# Patient Record
Sex: Female | Born: 1960 | Race: White | Hispanic: No | Marital: Single | Smoking: Never smoker
Health system: Southern US, Community
[De-identification: ages and names within clinical notes are randomized; demographics above are authoritative.]

## PROBLEM LIST (undated history)

## (undated) DIAGNOSIS — F319 Bipolar disorder, unspecified: Secondary | ICD-10-CM

## (undated) DIAGNOSIS — C029 Malignant neoplasm of tongue, unspecified: Secondary | ICD-10-CM

## (undated) DIAGNOSIS — K219 Gastro-esophageal reflux disease without esophagitis: Secondary | ICD-10-CM

## (undated) DIAGNOSIS — Z9889 Other specified postprocedural states: Secondary | ICD-10-CM

## (undated) DIAGNOSIS — C539 Malignant neoplasm of cervix uteri, unspecified: Secondary | ICD-10-CM

## (undated) DIAGNOSIS — E039 Hypothyroidism, unspecified: Secondary | ICD-10-CM

## (undated) DIAGNOSIS — I10 Essential (primary) hypertension: Secondary | ICD-10-CM

## (undated) HISTORY — PX: TONGUE SURGERY: SHX810

## (undated) HISTORY — PX: VAGINAL HYSTERECTOMY: SUR661

## (undated) HISTORY — PX: OTHER SURGICAL HISTORY: SHX169

## (undated) HISTORY — PX: PARTIAL GLOSSECTOMY: SHX2173

---

## 1998-05-16 ENCOUNTER — Other Ambulatory Visit: Admission: RE | Admit: 1998-05-16 | Discharge: 1998-05-16 | Payer: Self-pay | Admitting: Gynecology

## 1998-05-30 ENCOUNTER — Encounter: Payer: Self-pay | Admitting: Family Medicine

## 1998-05-30 ENCOUNTER — Ambulatory Visit (HOSPITAL_COMMUNITY): Admission: RE | Admit: 1998-05-30 | Discharge: 1998-05-30 | Payer: Self-pay | Admitting: Family Medicine

## 2005-12-03 ENCOUNTER — Emergency Department (HOSPITAL_COMMUNITY): Admission: EM | Admit: 2005-12-03 | Discharge: 2005-12-03 | Payer: Self-pay | Admitting: Emergency Medicine

## 2005-12-08 ENCOUNTER — Emergency Department (HOSPITAL_COMMUNITY): Admission: EM | Admit: 2005-12-08 | Discharge: 2005-12-08 | Payer: Self-pay | Admitting: Emergency Medicine

## 2006-07-26 ENCOUNTER — Emergency Department (HOSPITAL_COMMUNITY): Admission: EM | Admit: 2006-07-26 | Discharge: 2006-07-26 | Payer: Self-pay | Admitting: Emergency Medicine

## 2007-01-27 ENCOUNTER — Emergency Department (HOSPITAL_COMMUNITY): Admission: EM | Admit: 2007-01-27 | Discharge: 2007-01-27 | Payer: Self-pay | Admitting: Emergency Medicine

## 2007-08-07 ENCOUNTER — Emergency Department (HOSPITAL_COMMUNITY): Admission: EM | Admit: 2007-08-07 | Discharge: 2007-08-07 | Payer: Self-pay | Admitting: Emergency Medicine

## 2008-05-18 ENCOUNTER — Emergency Department (HOSPITAL_COMMUNITY): Admission: EM | Admit: 2008-05-18 | Discharge: 2008-05-18 | Payer: Self-pay | Admitting: Emergency Medicine

## 2008-06-08 ENCOUNTER — Emergency Department (HOSPITAL_COMMUNITY): Admission: EM | Admit: 2008-06-08 | Discharge: 2008-06-08 | Payer: Self-pay | Admitting: Emergency Medicine

## 2008-09-20 ENCOUNTER — Ambulatory Visit (HOSPITAL_COMMUNITY): Admission: RE | Admit: 2008-09-20 | Discharge: 2008-09-20 | Payer: Self-pay | Admitting: Internal Medicine

## 2009-03-30 ENCOUNTER — Ambulatory Visit (HOSPITAL_COMMUNITY): Admission: RE | Admit: 2009-03-30 | Discharge: 2009-03-30 | Payer: Self-pay | Admitting: Internal Medicine

## 2009-06-21 ENCOUNTER — Emergency Department (HOSPITAL_COMMUNITY): Admission: EM | Admit: 2009-06-21 | Discharge: 2009-06-21 | Payer: Self-pay | Admitting: Emergency Medicine

## 2010-04-06 ENCOUNTER — Ambulatory Visit (HOSPITAL_COMMUNITY): Admission: RE | Admit: 2010-04-06 | Discharge: 2010-04-06 | Payer: Self-pay | Admitting: Internal Medicine

## 2010-09-04 LAB — POCT I-STAT, CHEM 8
BUN: 10 mg/dL (ref 6–23)
Creatinine, Ser: 0.6 mg/dL (ref 0.4–1.2)
Glucose, Bld: 99 mg/dL (ref 70–99)
Hemoglobin: 14.3 g/dL (ref 12.0–15.0)
Sodium: 138 mEq/L (ref 135–145)
TCO2: 23 mmol/L (ref 0–100)

## 2010-12-25 ENCOUNTER — Telehealth: Payer: Self-pay | Admitting: Cardiology

## 2010-12-25 NOTE — Telephone Encounter (Signed)
CON/high blood pressure/self referral/ P# [phone], pt does not have regular doctor, no records available  Was going to schedule with either Dr. Elease Hashimoto or Dr. [place], however pt states was a former pt of Dr. 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 Leonard Schwartz, pt would like to have nurse call her back to see if there could be any exception for her being a new patient, she states she was seen by him in the 80's

## 2010-12-25 NOTE — Telephone Encounter (Signed)
Advised you were not taking new patients.  Her blood pressure has been running 170/116 for 2 weeks, increased stress. Did advise to go to urgent care, but wanted to wait for your answer.

## 2010-12-26 NOTE — Telephone Encounter (Signed)
Advised patient, states she will probably go to urgent care and then find PCP

## 2010-12-26 NOTE — Telephone Encounter (Signed)
She should go to Urgent Care then get established with a primary care.  I do not have any openings to see her.

## 2011-02-12 LAB — DIFFERENTIAL
Basophils Relative: 0
Eosinophils Absolute: 0.1
Eosinophils Relative: 1
Lymphs Abs: 1.9
Monocytes Relative: 5

## 2011-02-12 LAB — CBC
HCT: 42.9
MCHC: 35.3
MCV: 92.7
Platelets: 219
RBC: 4.62
WBC: 8.8

## 2011-02-12 LAB — I-STAT 8, (EC8 V) (CONVERTED LAB)
BUN: 8
Bicarbonate: 25.4 — ABNORMAL HIGH
Chloride: 107
pCO2, Ven: 43.3 — ABNORMAL LOW
pH, Ven: 7.376 — ABNORMAL HIGH

## 2011-02-12 LAB — POCT I-STAT CREATININE
Creatinine, Ser: 0.8
Operator id: 288831

## 2011-03-19 ENCOUNTER — Emergency Department (HOSPITAL_COMMUNITY)
Admission: EM | Admit: 2011-03-19 | Discharge: 2011-03-19 | Disposition: A | Discharge status: Home / Self Care | Payer: Medicare Other | Attending: Emergency Medicine | Admitting: Emergency Medicine

## 2011-03-19 ENCOUNTER — Emergency Department (HOSPITAL_COMMUNITY): Payer: Medicare Other

## 2011-03-19 DIAGNOSIS — IMO0001 Reserved for inherently not codable concepts without codable children: Secondary | ICD-10-CM | POA: Insufficient documentation

## 2011-03-19 DIAGNOSIS — IMO0002 Reserved for concepts with insufficient information to code with codable children: Secondary | ICD-10-CM | POA: Insufficient documentation

## 2011-03-19 DIAGNOSIS — R209 Unspecified disturbances of skin sensation: Secondary | ICD-10-CM | POA: Insufficient documentation

## 2011-03-19 DIAGNOSIS — Z79899 Other long term (current) drug therapy: Secondary | ICD-10-CM | POA: Insufficient documentation

## 2011-03-19 DIAGNOSIS — M545 Low back pain, unspecified: Secondary | ICD-10-CM | POA: Insufficient documentation

## 2011-03-19 DIAGNOSIS — Z85819 Personal history of malignant neoplasm of unspecified site of lip, oral cavity, and pharynx: Secondary | ICD-10-CM | POA: Insufficient documentation

## 2011-03-19 DIAGNOSIS — Z8541 Personal history of malignant neoplasm of cervix uteri: Secondary | ICD-10-CM | POA: Insufficient documentation

## 2014-03-31 ENCOUNTER — Encounter (HOSPITAL_COMMUNITY): Payer: Self-pay | Admitting: Emergency Medicine

## 2014-03-31 ENCOUNTER — Emergency Department (HOSPITAL_COMMUNITY)
Admission: EM | Admit: 2014-03-31 | Discharge: 2014-03-31 | Disposition: A | Discharge status: Home / Self Care | Payer: Medicare Other | Attending: Emergency Medicine | Admitting: Emergency Medicine

## 2014-03-31 DIAGNOSIS — Z85810 Personal history of malignant neoplasm of tongue: Secondary | ICD-10-CM | POA: Insufficient documentation

## 2014-03-31 DIAGNOSIS — L500 Allergic urticaria: Secondary | ICD-10-CM | POA: Diagnosis not present

## 2014-03-31 DIAGNOSIS — J069 Acute upper respiratory infection, unspecified: Secondary | ICD-10-CM | POA: Diagnosis not present

## 2014-03-31 DIAGNOSIS — F32A Depression, unspecified: Secondary | ICD-10-CM

## 2014-03-31 DIAGNOSIS — F315 Bipolar disorder, current episode depressed, severe, with psychotic features: Secondary | ICD-10-CM | POA: Insufficient documentation

## 2014-03-31 DIAGNOSIS — I159 Secondary hypertension, unspecified: Secondary | ICD-10-CM

## 2014-03-31 DIAGNOSIS — T7840XA Allergy, unspecified, initial encounter: Secondary | ICD-10-CM | POA: Diagnosis present

## 2014-03-31 DIAGNOSIS — Z792 Long term (current) use of antibiotics: Secondary | ICD-10-CM | POA: Diagnosis not present

## 2014-03-31 DIAGNOSIS — Z79899 Other long term (current) drug therapy: Secondary | ICD-10-CM | POA: Insufficient documentation

## 2014-03-31 DIAGNOSIS — F329 Major depressive disorder, single episode, unspecified: Secondary | ICD-10-CM

## 2014-03-31 DIAGNOSIS — T43295A Adverse effect of other antidepressants, initial encounter: Secondary | ICD-10-CM | POA: Diagnosis not present

## 2014-03-31 DIAGNOSIS — Z86001 Personal history of in-situ neoplasm of cervix uteri: Secondary | ICD-10-CM | POA: Insufficient documentation

## 2014-03-31 DIAGNOSIS — L03012 Cellulitis of left finger: Secondary | ICD-10-CM | POA: Diagnosis not present

## 2014-03-31 HISTORY — DX: Malignant neoplasm of tongue, unspecified: C02.9

## 2014-03-31 HISTORY — DX: Malignant neoplasm of cervix uteri, unspecified: C53.9

## 2014-03-31 HISTORY — DX: Bipolar disorder, unspecified: F31.9

## 2014-03-31 MED ORDER — EPINEPHRINE 0.3 MG/0.3ML IJ SOAJ: 0.3000 mg | Freq: Once | INTRAMUSCULAR | Status: DC

## 2014-03-31 MED ORDER — ALBUTEROL SULFATE (2.5 MG/3ML) 0.083% IN NEBU
5.0000 mg | INHALATION_SOLUTION | Freq: Once | RESPIRATORY_TRACT | Status: AC
  Administered 2014-03-31: 5 mg via RESPIRATORY_TRACT
  Filled 2014-03-31: qty 6

## 2014-03-31 MED ORDER — METHYLPREDNISOLONE SODIUM SUCC 125 MG IJ SOLR
125.0000 mg | Freq: Once | INTRAMUSCULAR | Status: AC
  Administered 2014-03-31: 125 mg via INTRAVENOUS
  Filled 2014-03-31: qty 2

## 2014-03-31 MED ORDER — IPRATROPIUM BROMIDE 0.02 % IN SOLN
0.5000 mg | Freq: Once | RESPIRATORY_TRACT | Status: AC
  Administered 2014-03-31: 0.5 mg via RESPIRATORY_TRACT
  Filled 2014-03-31: qty 2.5

## 2014-03-31 MED ORDER — PREDNISONE 5 MG PO TABS: 40.0000 mg | ORAL_TABLET | Freq: Every day | ORAL | Status: DC

## 2014-03-31 MED ORDER — LORATADINE 10 MG PO TABS: 10.0000 mg | ORAL_TABLET | Freq: Every day | ORAL | Status: DC

## 2014-03-31 MED ORDER — ALBUTEROL SULFATE HFA 108 (90 BASE) MCG/ACT IN AERS
2.0000 | INHALATION_SPRAY | RESPIRATORY_TRACT | Status: DC | PRN
  Administered 2014-03-31: 2 via RESPIRATORY_TRACT
  Filled 2014-03-31: qty 6.7

## 2014-03-31 MED ORDER — DIPHENHYDRAMINE HCL 50 MG/ML IJ SOLN
25.0000 mg | Freq: Once | INTRAMUSCULAR | Status: AC
  Administered 2014-03-31: 25 mg via INTRAVENOUS
  Filled 2014-03-31: qty 1

## 2014-03-31 MED ORDER — SODIUM CHLORIDE 0.9 % IV BOLUS (SEPSIS)
1000.0000 mL | Freq: Once | INTRAVENOUS | Status: AC
  Administered 2014-03-31: 1000 mL via INTRAVENOUS

## 2014-03-31 MED ORDER — DOXYCYCLINE HYCLATE 100 MG PO CAPS: 100.0000 mg | ORAL_CAPSULE | Freq: Two times a day (BID) | ORAL | Status: DC

## 2014-03-31 MED ORDER — DIPHENHYDRAMINE HCL 25 MG PO TABS: 25.0000 mg | ORAL_TABLET | Freq: Four times a day (QID) | ORAL | Status: DC

## 2014-03-31 MED ORDER — FAMOTIDINE IN NACL 20-0.9 MG/50ML-% IV SOLN
20.0000 mg | Freq: Once | INTRAVENOUS | Status: AC
  Administered 2014-03-31: 20 mg via INTRAVENOUS
  Filled 2014-03-31: qty 50

## 2014-03-31 MED ORDER — FAMOTIDINE 20 MG PO TABS: 20.0000 mg | ORAL_TABLET | Freq: Two times a day (BID) | ORAL | Status: DC

## 2014-03-31 MED ORDER — DIPHENHYDRAMINE HCL 50 MG/ML IJ SOLN: 50.0000 mg | Freq: Once | INTRAMUSCULAR | Status: DC

## 2014-03-31 NOTE — ED Notes (Signed)
Tiffany, PA at bedside.

## 2014-03-31 NOTE — ED Notes (Addendum)
The patient just started taking wellbutrin and she also takes lamictal, paxil and protonix.  The patient thinks she is having an allergic reaction to the wellbutrin.  She also says she took mucinex and doesn't know what's causing the welts. She said they started last night but she thought they were mosquitos bites.  When she woke up this morning they are all over her body and she is itching uncontrollably.  The patient is also starting to have lip swelling but she denies SOB.

## 2014-03-31 NOTE — ED Provider Notes (Signed)
CSN: [id number]     Arrival date & time 03/31/14  0610 History   First MD Initiated Contact with Patient 03/31/14 0617     Chief Complaint  Patient presents with  . Allergic Reaction    The patient just started taking wellbutrin and she also takes lamictal, paxil and protonix.  The patient thinks she is having an allergic reaction to the wellbutrin.     (Consider location/radiation/quality/duration/timing/severity/associated sxs/prior Treatment) HPI   Patient to the ER for allergic reactions. She takes home medications for Bipolar 1 disorder: Wellbutrin, Lamictal, Paxil, and Protonix. She has been having sinus drainage and took Mucinex as well. She had what she felt like were a few mosquito bites yesterday that were only mildly itchy and swollen. Throughout the night her swelling and pruritis has improved. This morning she woke up and had hives and burning to her entire body. She also has beginning of swelling to her bottom lip that is mild. She denies tongue swelling, neck, throat tightness or swelling. Denies SOB or wheezing. No CP. Pt has elevated BP, she says this is where her baseline BP has been but she needs a BP.  Past Medical History  Diagnosis Date  . Cervical cancer   . Tongue cancer   . Bipolar 1 disorder   . Manic depressive disorder    Past Surgical History  Procedure Laterality Date  . Tongue surgery     History reviewed. No pertinent family history. History  Substance Use Topics  . Smoking status: Never Smoker   . Smokeless tobacco: Never Used  . Alcohol Use: No   OB History    No data available     Review of Systems  10 Systems reviewed and are negative for acute change except as noted in the HPI.   Allergies  Augmentin  Home Medications   Prior to Admission medications   Medication Sig Start Date End Date Taking? Authorizing Provider  buPROPion (WELLBUTRIN XL) 150 MG 24 hr tablet Take 150 mg by mouth daily.   Yes Historical Provider, MD  guaiFENesin  (MUCINEX) 600 MG 12 hr tablet Take 600 mg by mouth daily as needed for cough or to loosen phlegm.   Yes Historical Provider, MD  lamoTRIgine (LAMICTAL) 150 MG tablet Take 300 mg by mouth daily.   Yes Historical Provider, MD  pantoprazole (PROTONIX) 40 MG tablet Take 40 mg by mouth daily.   Yes Historical Provider, MD  PARoxetine (PAXIL) 40 MG tablet Take 40 mg by mouth daily.   Yes Historical Provider, MD  diphenhydrAMINE (BENADRYL) 25 MG tablet Take 1 tablet (25 mg total) by mouth every 6 (six) hours. 03/31/14   Tamaya Pun Marilu Favre, PA-C  doxycycline (VIBRAMYCIN) 100 MG capsule Take 1 capsule (100 mg total) by mouth 2 (two) times daily. 03/31/14   Annahi Short Marilu Favre, PA-C  famotidine (PEPCID) 20 MG tablet Take 1 tablet (20 mg total) by mouth 2 (two) times daily. 03/31/14   Zanna Hawn Marilu Favre, PA-C  loratadine (CLARITIN) 10 MG tablet Take 1 tablet (10 mg total) by mouth daily. 03/31/14   Brightyn Mozer Marilu Favre, PA-C  predniSONE (DELTASONE) 5 MG tablet Take 8 tablets (40 mg total) by mouth daily. 03/31/14   Satonya Lux Marilu Favre, PA-C   BP 149/82 mmHg  Pulse 88  Temp(Src) 97.8 F (36.6 C) (Oral)  Resp 17  SpO2 100% Physical Exam  Constitutional: She appears well-developed and well-nourished. No distress.  HENT:  Head: Normocephalic and atraumatic.    Mouth/Throat: Uvula is  midline, oropharynx is clear and moist and mucous membranes are normal.  Portion of right posterior tongue is surgically absent.  Eyes: Pupils are equal, round, and reactive to light.  Neck: Normal range of motion. Neck supple.  Cardiovascular: Normal rate and regular rhythm.   Pulmonary/Chest: Effort normal and breath sounds normal. No accessory muscle usage. No respiratory distress.  Abdominal: Soft.  Neurological: She is alert.  Skin: Skin is warm and dry. Rash noted. Rash is urticarial.  Nursing note and vitals reviewed.     ED Course  Procedures (including critical care time) Labs Review Labs Reviewed - No data to  display  Imaging Review No results found.   EKG Interpretation None      MDM   Final diagnoses:  URI (upper respiratory infection)  Depression  Allergic reaction caused by a drug  Cellulitis of finger of left hand  Secondary hypertension, unspecified    Medications  albuterol (PROVENTIL HFA;VENTOLIN HFA) 108 (90 BASE) MCG/ACT inhaler 2 puff (not administered)  sodium chloride 0.9 % bolus 1,000 mL (1,000 mLs Intravenous New Bag/Given 03/31/14 0637)  famotidine (PEPCID) IVPB 20 mg (0 mg Intravenous Stopped 03/31/14 0713)  methylPREDNISolone sodium succinate (SOLU-MEDROL) 125 mg/2 mL injection 125 mg (125 mg Intravenous Given 03/31/14 0637)  diphenhydrAMINE (BENADRYL) injection 25 mg (25 mg Intravenous Given 03/31/14 0638)  albuterol (PROVENTIL) (2.5 MG/3ML) 0.083% nebulizer solution 5 mg (5 mg Nebulization Given 03/31/14 0748)  ipratropium (ATROVENT) nebulizer solution 0.5 mg (0.5 mg Nebulization Given 03/31/14 0748)  diphenhydrAMINE (BENADRYL) injection 25 mg (25 mg Intravenous Given 03/31/14 0910)   Pt does have some mild lip swelling. I do  Not feel that Epinephrine is needed at this time. Her urticaria is mild/mod at this time. Will monitor, give fluids, benadryl, pepcid and steroids to start. Will give Epi as needed.  Pt did well with treatment. Urticaria has resolved, lip swelling has resolved and she is feeling much better. The finger tip of the left middle finger continues to be red, suspect infection. She also had coughing, which has been going on for 1 month and not worse during this visit.   Will treat for finger infection, URI and allergic reaction. Recommend she get a PCP. The patients PCP recommends she DC wellbutrin due to allergic reaction. Will also Rx Epi Pen.  PARoxetine (PAXIL) 40 MG tablet Take 40 mg by mouth daily.  Historical Provider, MD    diphenhydrAMINE (BENADRYL) 25 MG tablet Take 1 tablet (25 mg total) by mouth every 6 (six) hours. 20 tablet Linus Mako, PA-C   doxycycline (VIBRAMYCIN) 100 MG capsule Take 1 capsule (100 mg total) by mouth 2 (two) times daily. 20 capsule Linus Mako, PA-C   famotidine (PEPCID) 20 MG tablet Take 1 tablet (20 mg total) by mouth 2 (two) times daily. 7 tablet Linus Mako, PA-C   loratadine (CLARITIN) 10 MG tablet Take 1 tablet (10 mg total) by mouth daily. 14 tablet Suzana Sohail Marilu Favre, PA-C   predniSONE (DELTASONE) 5 MG tablet Take 8 tablets (40 mg total) by mouth daily. 40 tablet Linus Mako, PA-C   53 y.o.Nancy [name]'s evaluation in the Emergency Department is complete. It has been determined that no acute conditions requiring further emergency intervention are present at this time. The patient/guardian have been advised of the diagnosis and plan. We have discussed signs and symptoms that warrant return to the ED, such as changes or worsening in symptoms.  Vital signs are stable at discharge. Filed  Vitals:   03/31/14 1016  BP: 149/82  Pulse: 88  Temp:   Resp: 17    Patient/guardian has voiced understanding and agreed to follow-up with the PCP or specialist.   Linus Mako, PA-C 03/31/14 [place], MD 03/31/14 2349

## 2014-03-31 NOTE — Discharge Instructions (Signed)
Cellulitis Cellulitis is an infection of the skin and the tissue beneath it. The infected area is usually red and tender. Cellulitis occurs most often in the arms and lower legs.  CAUSES  Cellulitis is caused by bacteria that enter the skin through cracks or cuts in the skin. The most common types of bacteria that cause cellulitis are staphylococci and streptococci. SIGNS AND SYMPTOMS   Redness and warmth.  Swelling.  Tenderness or pain.  Fever. DIAGNOSIS  Your health care provider can usually determine what is wrong based on a physical exam. Blood tests may also be done. TREATMENT  Treatment usually involves taking an antibiotic medicine. HOME CARE INSTRUCTIONS   Take your antibiotic medicine as directed by your health care provider. Finish the antibiotic even if you start to feel better.  Keep the infected arm or leg elevated to reduce swelling.  Apply a warm cloth to the affected area up to 4 times per day to relieve pain.  Take medicines only as directed by your health care provider.  Keep all follow-up visits as directed by your health care provider. SEEK MEDICAL CARE IF:   You notice red streaks coming from the infected area.  Your red area gets larger or turns dark in color.  Your bone or joint underneath the infected area becomes painful after the skin has healed.  Your infection returns in the same area or another area.  You notice a swollen bump in the infected area.  You develop new symptoms.  You have a fever. SEEK IMMEDIATE MEDICAL CARE IF:   You feel very sleepy.  You develop vomiting or diarrhea.  You have a general ill feeling (malaise) with muscle aches and pains. MAKE SURE YOU:   Understand these instructions.  Will watch your condition.  Will get help right away if you are not doing well or get worse. Document Released: 02/14/2005 Document Revised: 09/21/2013 Document Reviewed: 07/23/2011 [facility] Patient Information 2015 [place], [place].  This information is not intended to replace advice given to you by your health care provider. Make sure you discuss any questions you have with your health care provider. Allergies Allergies may happen from anything your body is sensitive to. This may be food, medicines, pollens, chemicals, and nearly anything around you in everyday life that produces allergens. An allergen is anything that causes an allergy producing substance. Heredity is often a factor in causing these problems. This means you may have some of the same allergies as your parents. Food allergies happen in all age groups. Food allergies are some of the most severe and life threatening. Some common food allergies are cow's milk, seafood, eggs, nuts, wheat, and soybeans. SYMPTOMS   Swelling around the mouth.  An itchy red rash or hives.  Vomiting or diarrhea.  Difficulty breathing. SEVERE ALLERGIC REACTIONS ARE LIFE-THREATENING. This reaction is called anaphylaxis. It can cause the mouth and throat to swell and cause difficulty with breathing and swallowing. In severe reactions only a trace amount of food (for example, peanut oil in a salad) may cause death within seconds. Seasonal allergies occur in all age groups. These are seasonal because they usually occur during the same season every year. They may be a reaction to molds, grass pollens, or tree pollens. Other causes of problems are house dust mite allergens, pet dander, and mold spores. The symptoms often consist of nasal congestion, a runny itchy nose associated with sneezing, and tearing itchy eyes. There is often an associated itching of the mouth and ears.  The problems happen when you come in contact with pollens and other allergens. Allergens are the particles in the air that the body reacts to with an allergic reaction. This causes you to release allergic antibodies. Through a chain of events, these eventually cause you to release histamine into the blood stream. Although it  is meant to be protective to the body, it is this release that causes your discomfort. This is why you were given anti-histamines to feel better. If you are unable to pinpoint the offending allergen, it may be determined by skin or blood testing. Allergies cannot be cured but can be controlled with medicine. Hay fever is a collection of all or some of the seasonal allergy problems. It may often be treated with simple over-the-counter medicine such as diphenhydramine. Take medicine as directed. Do not drink alcohol or drive while taking this medicine. Check with your caregiver or package insert for child dosages. If these medicines are not effective, there are many new medicines your caregiver can prescribe. Stronger medicine such as nasal spray, eye drops, and corticosteroids may be used if the first things you try do not work well. Other treatments such as immunotherapy or desensitizing injections can be used if all else fails. Follow up with your caregiver if problems continue. These seasonal allergies are usually not life threatening. They are generally more of a nuisance that can often be handled using medicine. HOME CARE INSTRUCTIONS   If unsure what causes a reaction, keep a diary of foods eaten and symptoms that follow. Avoid foods that cause reactions.  If hives or rash are present:  Take medicine as directed.  You may use an over-the-counter antihistamine (diphenhydramine) for hives and itching as needed.  Apply cold compresses (cloths) to the skin or take baths in cool water. Avoid hot baths or showers. Heat will make a rash and itching worse.  If you are severely allergic:  Following a treatment for a severe reaction, hospitalization is often required for closer follow-up.  Wear a medic-alert bracelet or necklace stating the allergy.  You and your family must learn how to give adrenaline or use an anaphylaxis kit.  If you have had a severe reaction, always carry your anaphylaxis  kit or EpiPen with you. Use this medicine as directed by your caregiver if a severe reaction is occurring. Failure to do so could have a fatal outcome. SEEK MEDICAL CARE IF:  You suspect a food allergy. Symptoms generally happen within 30 minutes of eating a food.  Your symptoms have not gone away within 2 days or are getting worse.  You develop new symptoms.  You want to retest yourself or your child with a food or drink you think causes an allergic reaction. Never do this if an anaphylactic reaction to that food or drink has happened before. Only do this under the care of a caregiver. SEEK IMMEDIATE MEDICAL CARE IF:   You have difficulty breathing, are wheezing, or have a tight feeling in your chest or throat.  You have a swollen mouth, or you have hives, swelling, or itching all over your body.  You have had a severe reaction that has responded to your anaphylaxis kit or an EpiPen. These reactions may return when the medicine has worn off. These reactions should be considered life threatening. MAKE SURE YOU:   Understand these instructions.  Will watch your condition.  Will get help right away if you are not doing well or get worse. Document Released: 07/31/2002 Document Revised: 09/01/2012  Document Reviewed: 01/05/2008 [facility] Patient Information 2015 [place], [place]. This information is not intended to replace advice given to you by your health care provider. Make sure you discuss any questions you have with your health care provider.   RESOURCE GUIDE  Chronic Pain Problems: Contact [facility]  [phone] Patients need to be referred by their primary care doctor.  Insufficient Money for Medicine: Contact [employer]:  call "211" or [place] [phone].  No Primary Care Doctor: Call Health Connect  [phone] - can help you locate a primary care doctor that  accepts your insurance, provides certain services, etc. Physician Referral Service-  [phone]  Agencies that provide inexpensive medical care: [facility]  [facility]  [phone] [facility]  [phone] [facility]  [phone] Planned Parenthood  [phone] [facility]  [phone]  [place] Providers: [facility]- [address], [address]  [phone], Mon-Fri 9am-7pm, Sat 9am-1pm [place], [address] [place], Suite [place]  [place]- [address]  [place], [address], [phone]  Only accepts [place] patients after they have their name  applied to their card  Self Pay (no insurance) in [facility]: Sickle Cell Patients: Dr Kevan Ny, [facility]  [place], [facility]- [place]  [facility] [place]- [address], [facility]- see information above (Speak to [employer] if you do not have insurance)       -  [facility]- [place], [place],  [place], [place]  Dr [place] Lawman-  [address], [place], [facility]- [address] [phone]       -  [facility]- [address], [place], also [address], [phone]       -    [facility]- [address], [place], 1st & 3rd Saturday   every month, 10am-1pm  1) Find a Doctor and Pay Out of Pocket Although you won't have to find out who is covered by your insurance plan, it is a good idea to ask around and get recommendations. You will then need to call the office and see if the doctor you  have chosen will accept you as a new patient and what types of options they offer for patients who are self-pay. Some doctors offer discounts or will set up payment plans for their patients who do not have insurance, but you will need to ask so you aren't surprised when you get to your appointment.  2) Contact Your Local Health Department Not all health departments have doctors that can see patients for sick visits, but many do, so it is worth a call to see if yours does. If you don't know where your local health department is, you  can check in your phone book. The CDC also has a tool to help you locate your state's health department, and many state websites also have listings of all of their local health departments.  3) Find a [facility] If your illness is not likely to be very severe or complicated, you may want to try a walk in clinic. These are popping up all over the country in pharmacies, drugstores, and shopping centers. They're usually staffed by nurse practitioners or physician assistants that have been trained to treat common illnesses and complaints. They're usually fairly quick and inexpensive. However, if you have serious medical issues or chronic medical problems, these are probably not your best option  [facility] [place], [facility], [address], [place], phone [phone] or [phone].  Monday - Friday, call for an appointment. [place], [facility], [address], [place], phone [phone] or [phone].  Monday - Friday, call for an appointment.  Abuse/Neglect: [place] [phone] [place] [phone] (After Hours)  Emergency Shelter:  [facility] [phone]  Maternity Homes: [facility] at the [place] [phone] [place] [phone]  MRSA Hotline #:    (620)738-3970  [facility] [place] Dept. [address]         [place] Phone:  [phone]                                  Phone:  [phone]                   Phone:  [phone]  [facility], [place]- [phone]       -     [facility] in [place], [address]                                  [place] [phone] or [phone] (After Hours)   [place]  Substance Abuse Resources: Alcohol and Drug Services  [phone] [place] [phone] [facility] [place] Chinita Pester [phone] Residential & Outpatient Substance Abuse Program  [phone]  Psychological Services: [place]  [phone] [place]  [place], [place]. [address] [place], [place]: [phone] or [phone], [email or web address]  Dental Assistance  If unable to pay or uninsured, contact:  [facility] or [facility]. to become qualified for the adult dental clinic.  Patients with Medicaid: [facility] [phone] [address]  [place], [place] [address] [phone]  If unable to pay, or uninsured, contact [facility] [phone]) or [place] [phone] in [place], [place] in [facility]) to become qualified for the adult dental clinic  Other [place]- [place], [place], [place], [address], [place], [place], 2nd and 4th Thursday of the month at 6:30am.  10 clients each day  by appointment, can sometimes see walk-in patients if someone does not show for an appointment. [facility]- [address] Hillard Danker [place], [place], [address], [place], [place], [place], [address], [place] [facility], [facility] [phone]  Please make every effort to establish with a primary care physician for routine medical care  [place]  The [place] provides a wide range of adult health services. Some of these services are designed to address the healthcare needs of all [facility] residents and all services are designed to meet the needs of uninsured/underinsured low income residents. Some services are available to any resident of [place], call [phone] for details. ] The [facility], a new medical clinic for adults, is now open. For more information about the Center and its services please call [phone]. For information on our [place] services, click here.  For more information on any of the following Department of Public Health programs, including hours of service, click on the highlighted link.  SERVICES FOR WOMEN (Adults and Teens) [employer] provide a full range of birth control options plus education and counseling. New patient visit and annual return visits include a complete examination, pap test as indicated, and other laboratory as indicated. Included is our [employer] for men.  Maternity Care is provided through pregnancy, including a six week post partum exam. Women who meet eligibility criteria for the Medicaid for Pregnant Women program, receive care free. Other women are charged on a sliding scale according to income. Note: [facility] provides services to pregnant women who have a Medicaid  card. Call [phone] for an appointment in [place] or [phone] for an appointment in [facility].  Primary Care for Medicaid [place] Access Women is available through the [place]. As primary care provider for the Universal program, women may designate the [facility] clinic as their primary care provider.  PLEASE CALL [phone] FOR AN APPOINTMENT FOR THE ABOVE SERVICES IN EITHER [place] OR [place]. Information available in [place] and [place].   Childbirth Education Classes are open to the public and offered to help families prepare for the best possible childbirth experience as well as to promote lifelong health and wellness. Classes are offered throughout the year and meet on the same night once a week for five weeks. Medicaid covers the cost of the classes for the mother-to-be and her partner. For participants without Medicaid, the cost of the class series is $45.00 for the mother-to-be and her partner. Class size is limited and registration is required. For more information or to register call [phone]. Baby items donated by [employer] and the [employer] are given away during each class series.  SERVICES FOR WOMEN AND MEN Sexually Transmitted Infection appointments, including HIV testing, are available daily (weekdays, except holidays). Call early as same-day appointments are limited. For an appointment in either [facility] or [place],  call [phone]. Services are confidential and free of charge.  Skin Testing for Tuberculosis Please call [phone]. Adult Immunizations are available, usually for a fee. Please call [phone] for details.  PLEASE CALL [phone] FOR AN APPOINTMENT FOR THE ABOVE SERVICES IN EITHER [place] OR [place].   [facility] provides up to the minute recommended vaccines for your travel destination. We also provide essential health and  political information to help insure a safe and pleasurable travel experience. This program is self-sustaining, however, fees are very competitive. We are a CERTIFIED YELLOW FEVER IMMUNIZATION approved clinic site. PLEASE CALL [phone] FOR AN APPOINTMENT IN EITHER [place] OR [place].   If you have questions about the services listed above, we want to answer them! Email [place] at: [email or web address] Home Visiting Services for elderly and the disabled are available to residents of [facility] who are in need of care that compares to the care offered by a nursing home, have needs that can be met by the program, and have [place] Medicaid. Other short term services are available to residents 18 years and older who are unable to meet requirements for eligibility to receive services from a certified home health agency, spend the majority of time at home, and need care for six months or less.  PLEASE CALL [phone] OR [phone] FOR MORE INFORMATION. Medication Assistance Program serves as a link between pharmaceutical companies and patients to provide low cost or free prescription medications. This servce is available for residents who meet certain income restrictions and have no insurance coverage.  PLEASE CALL [phone] ([place]) OR [phone] ([place]) FOR MORE INFORMATION.  Updated Feb. 21, 2013

## 2015-02-09 ENCOUNTER — Telehealth: Payer: Self-pay | Admitting: *Deleted

## 2015-02-09 ENCOUNTER — Emergency Department (HOSPITAL_COMMUNITY)
Admission: EM | Admit: 2015-02-09 | Discharge: 2015-02-09 | Disposition: A | Discharge status: Home / Self Care | Payer: Medicare Other | Attending: Emergency Medicine | Admitting: Emergency Medicine

## 2015-02-09 ENCOUNTER — Encounter (HOSPITAL_COMMUNITY): Payer: Self-pay | Admitting: *Deleted

## 2015-02-09 DIAGNOSIS — M79632 Pain in left forearm: Secondary | ICD-10-CM | POA: Diagnosis present

## 2015-02-09 DIAGNOSIS — Z8541 Personal history of malignant neoplasm of cervix uteri: Secondary | ICD-10-CM | POA: Insufficient documentation

## 2015-02-09 DIAGNOSIS — Z79899 Other long term (current) drug therapy: Secondary | ICD-10-CM | POA: Diagnosis not present

## 2015-02-09 DIAGNOSIS — Z792 Long term (current) use of antibiotics: Secondary | ICD-10-CM | POA: Diagnosis not present

## 2015-02-09 DIAGNOSIS — Z85810 Personal history of malignant neoplasm of tongue: Secondary | ICD-10-CM | POA: Diagnosis not present

## 2015-02-09 DIAGNOSIS — Z7952 Long term (current) use of systemic steroids: Secondary | ICD-10-CM | POA: Insufficient documentation

## 2015-02-09 DIAGNOSIS — F319 Bipolar disorder, unspecified: Secondary | ICD-10-CM | POA: Diagnosis not present

## 2015-02-09 DIAGNOSIS — G629 Polyneuropathy, unspecified: Secondary | ICD-10-CM

## 2015-02-09 MED ORDER — CYCLOBENZAPRINE HCL 10 MG PO TABS: 10.0000 mg | ORAL_TABLET | Freq: Two times a day (BID) | ORAL | Status: DC | PRN

## 2015-02-09 NOTE — ED Notes (Signed)
Pt reports having a TV fall on her 2 months ago. Pt states that she has had continued pain since then. Pt states that pain is constant and worse with movement.

## 2015-02-09 NOTE — ED Provider Notes (Signed)
CSN: [id number]     Arrival date & time 02/09/15  0930 History  This chart was scribed for non-physician practitioner, Okey Regal, PA-C working with Deno Etienne, DO by Tula Nakayama, ED scribe. This patient was seen in room TR11C/TR11C and the patient's care was started at 10:09 AM   Chief Complaint  Patient presents with  . Arm Injury   The history is provided by the patient. No language interpreter was used.   HPI Comments: Nancy [name] is a 54 y.o. female who presents to the Emergency Department complaining of complaining of constant, gradually worsening left forearm pain that radiates to her left shoulder and neck, started 2 months ago and became worse within the last 2 weeks. Pt states intermittent tingling of her left hand that started two weeks ago as an associated symptom. Pt has tried up to 1800 mg of Ibuprofen daily with some relief. She has also applied sports tape with no relief. Pt reports that onset of pain started after she was pulling a TV from a high shelf and it hit her in the head, right clavicle and left forearm. She is left-handed. Pt denies numbness of her left hand.    Past Medical History  Diagnosis Date  . Cervical cancer   . Tongue cancer   . Bipolar 1 disorder   . Manic depressive disorder    Past Surgical History  Procedure Laterality Date  . Tongue surgery     No family history on file. Social History  Substance Use Topics  . Smoking status: Never Smoker   . Smokeless tobacco: Never Used  . Alcohol Use: No   OB History    No data available     Review of Systems  All other systems reviewed and are negative.   Allergies  Augmentin  Home Medications   Prior to Admission medications   Medication Sig Start Date End Date Taking? Authorizing Provider  cyclobenzaprine (FLEXERIL) 10 MG tablet Take 1 tablet (10 mg total) by mouth 2 (two) times daily as needed for muscle spasms. 02/09/15   Okey Regal, PA-C  diphenhydrAMINE (BENADRYL) 25 MG  tablet Take 1 tablet (25 mg total) by mouth every 6 (six) hours. 03/31/14   Tiffany Carlota Raspberry, PA-C  doxycycline (VIBRAMYCIN) 100 MG capsule Take 1 capsule (100 mg total) by mouth 2 (two) times daily. 03/31/14   Tiffany Carlota Raspberry, PA-C  EPINEPHrine 0.3 mg/0.3 mL IJ SOAJ injection Inject 0.3 mLs (0.3 mg total) into the muscle once. 03/31/14   Tiffany Carlota Raspberry, PA-C  famotidine (PEPCID) 20 MG tablet Take 1 tablet (20 mg total) by mouth 2 (two) times daily. 03/31/14   Tiffany Carlota Raspberry, PA-C  guaiFENesin (MUCINEX) 600 MG 12 hr tablet Take 600 mg by mouth daily as needed for cough or to loosen phlegm.    Historical Provider, MD  lamoTRIgine (LAMICTAL) 150 MG tablet Take 300 mg by mouth daily.    Historical Provider, MD  loratadine (CLARITIN) 10 MG tablet Take 1 tablet (10 mg total) by mouth daily. 03/31/14   Tiffany Carlota Raspberry, PA-C  pantoprazole (PROTONIX) 40 MG tablet Take 40 mg by mouth daily.    Historical Provider, MD  PARoxetine (PAXIL) 40 MG tablet Take 40 mg by mouth daily.    Historical Provider, MD  predniSONE (DELTASONE) 5 MG tablet Take 8 tablets (40 mg total) by mouth daily. 03/31/14   Tiffany Carlota Raspberry, PA-C   BP 160/105 mmHg  Pulse 65  Temp(Src) 98.4 F (36.9 C) (Oral)  Resp 18  Ht  5\' 5"  (1.651 m)  Wt 185 lb (83.915 kg)  BMI 30.79 kg/m2  SpO2 100%    Physical Exam  Constitutional: She appears well-developed and well-nourished. No distress.  HENT:  Head: Normocephalic and atraumatic.  Eyes: Conjunctivae and EOM are normal.  Neck: Neck supple. No tracheal deviation present.  Cardiovascular: Normal rate.   Pulmonary/Chest: Effort normal. No respiratory distress.  Musculoskeletal: Normal range of motion. She exhibits tenderness.  Shoulders and UE symmetrical bilaterally Left arm: No signs of trauma Minor skin irritation to left forearm from tape TTP of the proximal forearm No focal tenderness Full active ROM Left shoulder pain with forward flexion TTP of the anterior left  shoulder Minimally tender to left trapezius Full active neck ROM UE and LE strength 5/5  Sensation grossly intact  Skin: Skin is warm and dry.  Psychiatric: She has a normal mood and affect. Her behavior is normal.  Nursing note and vitals reviewed.   ED Course  Procedures   DIAGNOSTIC STUDIES: Oxygen Saturation is 98% on RA, normal by my interpretation.    COORDINATION OF CARE: 10:15 AM Discussed treatment plan with pt which includes referral to an orthopedist. Pt agreed to plan.   MDM   Final diagnoses:  Neuropathy   Labs:    Imaging:   Consults:   Therapeutics:   Discharge Meds:   Assessment/Plan: Suspect non-emergent radicular pain, no neuro deficits.Advised pt to continue OTC Ibuprofen and Tylenol for pain, as needed. Pt to apply heat. Will refer to orthopedist.    I personally performed the services described in this documentation, which was scribed in my presence. The recorded information has been reviewed and is accurate.   Okey Regal, PA-C 02/10/15 [place], DO 02/11/15 0006

## 2015-02-09 NOTE — ED Notes (Signed)
Declined W/C at D/C and was escorted to lobby by RN. 

## 2015-02-09 NOTE — Discharge Instructions (Signed)
Please follow-up with orthopedic specialist for further evaluation and management. Please use medications as directed. Please continue continue using ibuprofen or Tylenol as needed for pain.

## 2016-11-22 HISTORY — PX: LARYNGOSCOPY: SUR817

## 2017-03-04 DIAGNOSIS — Z9889 Other specified postprocedural states: Secondary | ICD-10-CM

## 2017-03-04 HISTORY — PX: EXCISION OF TONGUE LESION: SHX6434

## 2017-03-04 HISTORY — DX: Other specified postprocedural states: Z98.890

## 2017-03-04 HISTORY — PX: MANDIBLE RECONSTRUCTION: SHX431

## 2017-03-04 HISTORY — PX: OTHER SURGICAL HISTORY: SHX169

## 2017-03-04 HISTORY — PX: GLOSSECTOMY: SUR645

## 2017-03-04 HISTORY — PX: SKIN FULL THICKNESS GRAFT: SHX442

## 2017-04-15 ENCOUNTER — Encounter: Payer: Self-pay | Admitting: Radiation Oncology

## 2017-04-17 ENCOUNTER — Encounter: Payer: Self-pay | Admitting: *Deleted

## 2017-04-17 NOTE — Progress Notes (Signed)
Oncology Nurse Navigator Documentation  Sent fax request to [place] requesting push via [employer] Share to [facility] the following imaging: . CT Neck/Thyroid W Contrast, 11/16/2016 . CT Chest Abdomen Pelvis W Contrast, 11/16/2016 Notification of successful fax transmission received.  Gayleen Orem, RN, BSN, [place] Oncology Nurse [place] at [place] [phone]

## 2017-04-18 ENCOUNTER — Telehealth: Payer: Self-pay | Admitting: *Deleted

## 2017-04-18 ENCOUNTER — Other Ambulatory Visit: Payer: Self-pay | Admitting: *Deleted

## 2017-04-18 ENCOUNTER — Encounter: Payer: Self-pay | Admitting: Physical Therapy

## 2017-04-18 ENCOUNTER — Ambulatory Visit
Admission: RE | Admit: 2017-04-18 | Discharge: 2017-04-18 | Disposition: A | Discharge status: Home / Self Care | Payer: Self-pay | Source: Ambulatory Visit | Attending: Radiation Oncology | Admitting: Radiation Oncology

## 2017-04-18 ENCOUNTER — Encounter: Payer: Self-pay | Admitting: Radiation Oncology

## 2017-04-18 ENCOUNTER — Ambulatory Visit: Payer: Medicare Other | Attending: Otolaryngology | Admitting: Physical Therapy

## 2017-04-18 ENCOUNTER — Other Ambulatory Visit: Payer: Self-pay

## 2017-04-18 DIAGNOSIS — C01 Malignant neoplasm of base of tongue: Secondary | ICD-10-CM

## 2017-04-18 DIAGNOSIS — R262 Difficulty in walking, not elsewhere classified: Secondary | ICD-10-CM | POA: Diagnosis present

## 2017-04-18 DIAGNOSIS — I890 Lymphedema, not elsewhere classified: Secondary | ICD-10-CM | POA: Diagnosis present

## 2017-04-18 DIAGNOSIS — Z483 Aftercare following surgery for neoplasm: Secondary | ICD-10-CM | POA: Diagnosis present

## 2017-04-18 DIAGNOSIS — M6281 Muscle weakness (generalized): Secondary | ICD-10-CM

## 2017-04-18 DIAGNOSIS — M25511 Pain in right shoulder: Secondary | ICD-10-CM | POA: Diagnosis present

## 2017-04-18 NOTE — Progress Notes (Signed)
Head and Neck Cancer Location of Tumor / Histology:  Dr. Colin Benton Otolaryngology [facility] hospital  Per report, pathology showed "squamous cell carcinoma, moderately differentiated, 1.3 cm. Tumor extends to both peripheral and deep margins. Suspicious for lymphovascular invasion. Negative for perineural invasion. Per the outside report, depth of invasion was difficult to determine and the tumor invades to a depth of at least 0.6 cm in specimen 1, compatible with pT2, however, deeper levels of invasion cannot be excluded as the tumor extends to the deep margins. No nodes were submitted for evaluation (pNX)."  Subsequent biopsies showed involvement of mandible. The patient met with Dr. Faythe Ghee and has now completed induction chemotherapy on clinical trial. She reports no pain since the first round of induction therapy. She received 6 cycles of chemotherapy. Every Monday for 6 weeks.    Patient presented with symptoms of: Dr. Colin Benton Otolaryngology and [facility] docuemented 02/20/17: She has had multiple surgical procedures for both benign and malignant lesions of the right tongue. She first noticed a white lesion on the right lateral tongue in 2004; she underwent a WLE in 2004 in [place], [place]. She is unsure if this was benign hyperkeratosis or malignant. She underwent multiple resections of the right lateral tongue between 2005-2008 at [facility]; these were all negative for cancer. She noticed another lesion in 2009 which was resected by Dr. Dimas Millin. She did well until a few months ago. In April 2018 she noticed a firm bump on the lateral tongue along with pain and burning sensation. She had a biopsy performed in clinic that showed atypia. She underwent partial glossectomy in late May 2018 with Dr. Dimas Millin. Unfortuantely, all margins of the tumor were positive. She was evaluated at [facility] and was advised for right hemiglossectomy, radial forearm free flap reconstruction as well as bilateral  selective neck dissection. She presented to clinic here for second opinion.    Biopsies of Tongue, mandible revealed: moderately differentiated squamous cell carcinoma.   Nutrition Status Yes No Comments  Weight changes? '[]'$  '[x]'$    Swallowing concerns? '[x]'$  '[]'$  She had difficulty swallowing after surgery. She is eating softer foods. She needs to eat on the left side of her mouth and swallow on the left side due to swelling.   PEG? '[]'$  '[x]'$     Referrals Yes No Comments  Social Work? '[]'$  '[x]'$    Dentistry? '[x]'$  '[]'$  Dr. Enrique Sack today  Swallowing therapy? '[]'$  '[x]'$    Nutrition? '[]'$  '[x]'$    Med/Onc? '[x]'$  '[]'$  Dr. Faythe Ghee   Safety Issues Yes No Comments  Prior radiation? '[]'$  '[x]'$    Pacemaker/ICD? '[]'$  '[x]'$    Possible current pregnancy? '[]'$  '[x]'$    Is the patient on methotrexate? '[]'$  '[x]'$     Tobacco/Marijuana/Snuff/ETOH use: She has never smoked or used smokeless tobacco. She does not drink alcohol or use elicit drugs.   Past/Anticipated interventions by otolaryngology, if any:  03/04/17 Dr. Colin Benton 03/04/2017  Right - GLOSSECTOMY; COMPOSITE WO RADICAL NECK DISSECT Left - FREE OSTEOCUTANEOUS FLAP WITH MICROVASCULAR ANASTOMOSIS; OTHER THAN ILIAC CREST, METATARSAL OR GREAT TOE Right - RECON MANDIB/MAXIL SUBPERIOSTEAL IMPLNT; COMPL Right - CERVICAL LYMPHADENECTOMY (MODIFIED RADICAL NECK DISSECTION) MICROSURGICAL TECHNIQUES, REQUIRING USE OF OPERATING MICROSCOPE (LIST SEPARATELY IN ADDITION TO CODE FOR PRIMARY PROCEDURE) Left - Full Thick Gft-Free-Sclp; 20 Sq Cm/Less Left - Exploration (Not Followed By Surgical Repair), With Or Without Lysis Or Artery; Carotid Artery Midline - Tracheostomy Planned ([facility] Proc)   10/17/2016 Surgery  Partial glossectomy with Dr. Dimas Millin. All margins of tumor positive for  SCC, moderately differentiated.     Past/Anticipated interventions by medical oncology, if any:  02/04/17 Dr. Budd PalmerJeannine Kitten Induction Chemotherapy received: Carboplatin, Erbitux, Abraxane. She received  6 cycles on a weekly basis.    Current Complaints / other details:   --Tongue cancer (CMS-HCC)  10/17/2016 Surgery  --Partial glossectomy with Dr. Dimas Millin. All margins of tumor positive for SCC, moderately differentiated.  10/17/2016 Initial Diagnosis  SCC, moderately differentiated. Suspicious for lymphovascular invasion. Negative for perineural invasion.  --11/16/2016 Interval Scan(s)  CT Neck showed enhancing mass along lateral right tongue. Erosive changes of medial aspect of posterior right mandibular body concerning for bony involvement.  --11/22/2016 Biopsy  Laryngoscopy with right mandibular biopsy. Path showed invasive moderately differentiated keratinizing SCC. p16 negative and HPV 16/18 neg by IHC.    BP 140/82   Pulse 92   Temp 98.8 F (37.1 C)   Ht '5\' 5"'$  (1.651 m)   Wt 175 lb (79.4 kg)   SpO2 97% Comment: room air  BMI 29.12 kg/m    Wt Readings from Last 3 Encounters:  04/23/17 175 lb (79.4 kg)  02/09/15 185 lb (83.9 kg)

## 2017-04-18 NOTE — Telephone Encounter (Addendum)
Oncology Nurse Navigator Documentation  Placed introductory call to new referral patient.  Introduced myself as the H&N oncology nurse navigator that works with Dr. Isidore Moos to whom she has been referred by Dr. [place] Callas, [facility].  She confirmed her understanding of referral and appt date/time of 12/4 10:30/11:00.  I briefly explained my role as her navigator, indicated that I would be joining her during her appt next week.  She noted her parents will be joining her.  I answered her questions regarding post-surgical RT, appt with [facility] today for lymphedema eval.  We discussed dental referral for pre-radiotherapy evaluation pending at [facility].  She indicated preference to be be seen by [place], voiced understanding I will facilitate.  She noted she had 4 extractions R jaw as part of 10/15 surgery, was told she may need jaw reconstruction.  I explained reconstruction wd be appropriate s/p RT.  I confirmed her understanding of [place] location, explained arrival and RadOnc registration process for appt.  I provided my contact information, encouraged her to call with questions/concerns before next week.  She verbalized understanding of information provided, expressed appreciation for my call.  Gayleen Orem, RN, BSN, [place]ck Oncology Nurse [place] at [place] [phone]

## 2017-04-18 NOTE — Telephone Encounter (Signed)
Oncology Nurse Navigator Documentation  Placed new referral introductory phone call, LVMM requesting call-back.  Gayleen Orem, RN, BSN, [place] Neck Oncology Nurse [place] at [place] [phone]

## 2017-04-18 NOTE — Telephone Encounter (Signed)
A user error has taken place: encounter opened in error, closed for administrative reasons.

## 2017-04-19 ENCOUNTER — Other Ambulatory Visit: Payer: Self-pay | Admitting: Radiation Oncology

## 2017-04-19 NOTE — Therapy (Signed)
[place], [place], [address] Phone: [phone]   Fax:  [phone]  Physical Therapy Evaluation  Patient Details  Name: Nancy [name] MRN: [id number] Date of Birth: 06-30-1960 Referring Provider: Dr. Colin Benton   Encounter Date: 04/18/2017  PT End of Session - 04/19/17 0751    Visit Number  1    Number of Visits  18    Date for PT Re-Evaluation  06/19/16    PT Start Time  2836    PT Stop Time  1430    PT Time Calculation (min)  45 min    Activity Tolerance  Patient tolerated treatment well    Behavior During Therapy  [facility] for tasks assessed/performed       Past Medical History:  Diagnosis Date  . Bipolar 1 disorder ([place])   . Cervical cancer ([place])   . GERD (gastroesophageal reflux disease)   . History of tracheostomy 03/04/2017   She had trach placed during surgery and reversed before discharge [facility])  . Hypertension   . Hypothyroid   . Manic depressive disorder ([place])   . Tongue cancer [facility])     Past Surgical History:  Procedure Laterality Date  . bone-skin graft     free osteocutaneous flap with microvascular anastomosis: other than iliac crest, metatarsal or great toe.   . cervical lymphadenectomy  03/04/2017   (modified radical neck dissection) [facility].   . EXCISION OF TONGUE LESION Right 03/04/2017   PR excis tongue, mouth, jaw. [facility])  . exploration, carotid  Left 03/04/2017   [facility].   . GLOSSECTOMY  03/04/2017   Glossectomy; composite WO Radical neck dissect [facility])  . LARYNGOSCOPY  11/22/2016   Laryngoscopy, direct, operative with biopsy, [place]  03/04/2017   Reconstruct mandible/ maxil subperiosteal implant, [facility]  . PARTIAL GLOSSECTOMY    . SKIN FULL THICKNESS GRAFT  03/04/2017   [place]    . VAGINAL HYSTERECTOMY      There were no vitals filed for this  visit.   Subjective Assessment - 04/18/17 1356    Subjective  Pt is swelling in her right face, chest and arm.  She has a wound on her left leg that is swelling.  She has had all her treatment at [facility] so far ( her parents live in [place]) and she wants to have her radiation at our [facility]     Pertinent History  chemo from [place] til sept 28. Oct 15 surgery for neck dissection, removal tongue and jaw with reconsturction with artery, skin and fibula from left leg. She has skin graft on left leg and has pain and swelling in leg.  She had physical therapy at home up until last week and is still getting home health RN for wound care  they took 53 lymph nodes and she has had swelling and decreased range of motion of her nedk and right arm     Patient Stated Goals  to get full range of motion of neck and shoulder and get rid of pain in leg and get help with swelling in her neck     Currently in Pain?  Yes    Pain Score  8     Pain Location  Leg    Pain Orientation  Left    Pain Descriptors / Indicators  Throbbing;Burning;Constant    Pain Type  Acute pain;Surgical pain  Pain Onset  More than a month ago    Pain Frequency  Constant    Aggravating Factors   trying to be up  on it and do housework     Pain Relieving Factors  resting, tylenol , elevation     Effect of Pain on Daily Activities  unable to do daily activites             LYMPHEDEMA/ONCOLOGY QUESTIONNAIRE - 04/18/17 1426      Left Lower Extremity Lymphedema   Circumference of ankle/heel  29.5 cm. on diagonal     5 cm Proximal to 1st MTP Joint  22 cm        Quick Dash - 04/19/17 0001    Open a tight or new jar  Severe difficulty    Do heavy household chores (wash walls, wash floors)  Severe difficulty    Carry a shopping bag or briefcase  Severe difficulty    Wash your back  Unable    Use a knife to cut food  No difficulty    Recreational activities in which you take some force or impact through your arm,  shoulder, or hand (golf, hammering, tennis)  Unable    During the past week, to what extent has your arm, shoulder or hand problem interfered with your normal social activities with family, friends, neighbors, or groups?  Modererately    During the past week, to what extent has your arm, shoulder or hand problem limited your work or other regular daily activities  Quite a bit    Arm, shoulder, or hand pain.  Moderate    Tingling (pins and needles) in your arm, shoulder, or hand  Moderate    Difficulty Sleeping  Moderate difficulty    DASH Score  63.64 %       Objective measurements completed on examination: See above findings.                PT Short Term Goals - 04/19/17 1311      PT SHORT TERM GOAL #1   Title  Pt will be independent in self manual lymph draiange and use of compression to manage neck lymphedema     Time  4    Period  Weeks    Status  New      PT SHORT TERM GOAL #2   Title  Pt will be independent in a home exercise program for strength of UE and LE  and neck range of motion     Time  4    Period  Weeks    Status  New      PT SHORT TERM GOAL #3   Title  Pt will increase active range of motin of right atm to 130 indicating increase in UE strength so that she can perfom household chores easier     Time  4    Period  Weeks    Status  New      PT SHORT TERM GOAL #4   Title  Pt will report a decrease in overall pain by 25%    Time  4    Period  Weeks    Status  New      PT SHORT TERM GOAL #5   Title  Pt will increase right grip strength by an average of 10#     Time  4    Period  Weeks    Status  New        PT Long Term  Goals - 04/19/17 1314      PT LONG TERM GOAL #1   Title  Pt will decrease quick DASH score to less than 38 indicating a functional improvment in left arm use     Time  8    Period  Weeks    Status  New      PT LONG TERM GOAL #2   Title  Pt will increase 30 second sit to stand by 2 repetitions showing improvment in  functional strength    Time  8    Period  Weeks    Status  New      PT LONG TERM GOAL #3   Title  pt will decrease TUG score by 2 seconds indicating an improvement in functional gait and balance.     Time  8    Period  Weeks    Status  New      PT LONG TERM GOAL #4   Title  Pt will report a decrease in pain by 50%     Time  8    Period  Weeks    Status  New             Plan - 04/19/17 3833    Clinical Impression Statement  Pt is a pleaseant 56 yo female who presents to PT after extensive surgery on Oct. 15, 2018 for reconstruction of right tongue and jaw with donor tissue from left leg for treatment of tongue and jaw cancer. As she has mulitple body areas with functional impairment, more time is needed to complete eval. She has pain, weakness with muscular atrophy and decreased active range of motion in right scapular area and shoulder. She pain, decreased range of motion and fibrotic edema in her neck and she has pain, swelling, weakness in her left leg with gair impairment.  She was encouraged to wear her boot more consistently and will bring it in next time.     History and Personal Factors relevant to plan of care:  lives alone, has multiple areas of functional impairment    Clinical Presentation  Evolving    Clinical Presentation due to:  will be starting radiation     Clinical Decision Making  Moderate    Rehab Potential  Good    Clinical Impairments Affecting Rehab Potential  extensive surgery     PT Frequency  2x / week    PT Duration  8 weeks    PT Treatment/Interventions  ADLs/Self Care Home Management;Therapeutic activities;Therapeutic exercise;Taping;Manual techniques;Manual lymph drainage;Compression bandaging;Passive range of motion;Scar mobilization;Patient/family education;Functional mobility training;DME Instruction;Balance training;Neuromuscular re-education;Gait training;Stair training    PT Next Visit Plan  Perform grip strength, 30 sec sit to stand, TUG, neck  circumfernce and gait velocity test. Begin with Meeks decompression exercises, neck MLD, ROM, possibly kinesiotaping, discuss and order compression garment prior to radiation, Then continue to progress shoulder exercise. For leg, check to see if pt brings in boot and treat edema with compression if indicated and progress exercise for leg strength and gait training.     Consulted and Agree with Plan of Care  Patient       Patient will benefit from skilled therapeutic intervention in order to improve the following deficits and impairments:  Abnormal gait, Decreased endurance, Decreased skin integrity, Impaired sensation, Increased edema, Decreased scar mobility, Decreased knowledge of precautions, Decreased activity tolerance, Decreased knowledge of use of DME, Decreased strength, Increased fascial restricitons, Impaired UE functional use, Pain, Difficulty walking, Decreased mobility, Decreased balance,  Decreased range of motion, Impaired perceived functional ability, Postural dysfunction  Visit Diagnosis: Aftercare following surgery for neoplasm - Plan: PT plan of care cert/re-cert  Muscle weakness (generalized) - Plan: PT plan of care cert/re-cert  Acute pain of right shoulder - Plan: PT plan of care cert/re-cert  Lymphedema, not elsewhere classified - Plan: PT plan of care cert/re-cert  Difficulty in walking, not elsewhere classified - Plan: PT plan of care cert/re-cert  G-Codes - 03/35/33 0800    Functional Assessment Tool Used (Outpatient Only)  quick DASH    Functional Limitation  Carrying, moving and handling objects    Carrying, Moving and Handling Objects Current Status (T7409)  At least 60 percent but less than 80 percent impaired, limited or restricted    Carrying, Moving and Handling Objects Goal Status (L2780)  At least 20 percent but less than 40 percent impaired, limited or restricted        Problem List There are no active problems to display for this patient. Donato Heinz.  [place] Shark PT   Norwood Levo 04/19/2017, 1:29 PM  [place], [place], [address] Phone: [phone]   Fax:  [phone]  Name: IVON ROEDEL MRN: [id number] Date of Birth: 08-15-1960

## 2017-04-22 ENCOUNTER — Telehealth: Payer: Self-pay | Admitting: *Deleted

## 2017-04-22 NOTE — Telephone Encounter (Signed)
Oncology Nurse Navigator Documentation  In follow-up to second call to [facility]iology for 02/11/17 PET image transfer via [employer] transfer and subsequent call to [facility], called CP again to check on availability of scan for placement on [facility] timeline.  Spoke with [place] who completed transfer to timeline.  Gayleen Orem, RN, BSN, [place] Neck Oncology Nurse [place] at [place] [phone]

## 2017-04-23 ENCOUNTER — Ambulatory Visit
Admission: RE | Admit: 2017-04-23 | Discharge: 2017-04-23 | Disposition: A | Discharge status: Home / Self Care | Payer: Medicare Other | Source: Ambulatory Visit | Attending: Radiation Oncology | Admitting: Radiation Oncology

## 2017-04-23 ENCOUNTER — Other Ambulatory Visit: Payer: Self-pay | Admitting: Radiation Oncology

## 2017-04-23 ENCOUNTER — Ambulatory Visit (HOSPITAL_COMMUNITY): Payer: Self-pay | Admitting: Dentistry

## 2017-04-23 ENCOUNTER — Encounter (HOSPITAL_COMMUNITY): Payer: Self-pay | Admitting: Dentistry

## 2017-04-23 ENCOUNTER — Encounter: Payer: Self-pay | Admitting: Radiation Oncology

## 2017-04-23 ENCOUNTER — Encounter: Payer: Self-pay | Admitting: *Deleted

## 2017-04-23 VITALS — BP 124/77 | HR 77 | Temp 98.3°F

## 2017-04-23 VITALS — BP 140/82 | HR 92 | Temp 98.8°F | Ht 65.0 in | Wt 175.0 lb

## 2017-04-23 DIAGNOSIS — K08409 Partial loss of teeth, unspecified cause, unspecified class: Secondary | ICD-10-CM

## 2017-04-23 DIAGNOSIS — I10 Essential (primary) hypertension: Secondary | ICD-10-CM | POA: Diagnosis not present

## 2017-04-23 DIAGNOSIS — E039 Hypothyroidism, unspecified: Secondary | ICD-10-CM | POA: Insufficient documentation

## 2017-04-23 DIAGNOSIS — Z510 Encounter for antineoplastic radiation therapy: Secondary | ICD-10-CM | POA: Diagnosis present

## 2017-04-23 DIAGNOSIS — F339 Major depressive disorder, recurrent, unspecified: Secondary | ICD-10-CM | POA: Insufficient documentation

## 2017-04-23 DIAGNOSIS — Z791 Long term (current) use of non-steroidal anti-inflammatories (NSAID): Secondary | ICD-10-CM | POA: Insufficient documentation

## 2017-04-23 DIAGNOSIS — C049 Malignant neoplasm of floor of mouth, unspecified: Secondary | ICD-10-CM | POA: Insufficient documentation

## 2017-04-23 DIAGNOSIS — K219 Gastro-esophageal reflux disease without esophagitis: Secondary | ICD-10-CM | POA: Diagnosis not present

## 2017-04-23 DIAGNOSIS — Z91041 Radiographic dye allergy status: Secondary | ICD-10-CM | POA: Insufficient documentation

## 2017-04-23 DIAGNOSIS — R1310 Dysphagia, unspecified: Secondary | ICD-10-CM | POA: Diagnosis not present

## 2017-04-23 DIAGNOSIS — C021 Malignant neoplasm of border of tongue: Secondary | ICD-10-CM | POA: Diagnosis not present

## 2017-04-23 DIAGNOSIS — Z9889 Other specified postprocedural states: Secondary | ICD-10-CM

## 2017-04-23 DIAGNOSIS — C029 Malignant neoplasm of tongue, unspecified: Secondary | ICD-10-CM | POA: Insufficient documentation

## 2017-04-23 DIAGNOSIS — Z01818 Encounter for other preprocedural examination: Secondary | ICD-10-CM

## 2017-04-23 DIAGNOSIS — Z79899 Other long term (current) drug therapy: Secondary | ICD-10-CM | POA: Diagnosis not present

## 2017-04-23 DIAGNOSIS — K0530 Chronic periodontitis, unspecified: Secondary | ICD-10-CM

## 2017-04-23 DIAGNOSIS — Z1329 Encounter for screening for other suspected endocrine disorder: Secondary | ICD-10-CM

## 2017-04-23 DIAGNOSIS — Z8541 Personal history of malignant neoplasm of cervix uteri: Secondary | ICD-10-CM | POA: Insufficient documentation

## 2017-04-23 DIAGNOSIS — M264 Malocclusion, unspecified: Secondary | ICD-10-CM

## 2017-04-23 DIAGNOSIS — Z881 Allergy status to other antibiotic agents status: Secondary | ICD-10-CM | POA: Insufficient documentation

## 2017-04-23 DIAGNOSIS — K06010 Localized gingival recession, unspecified: Secondary | ICD-10-CM

## 2017-04-23 DIAGNOSIS — Z9221 Personal history of antineoplastic chemotherapy: Secondary | ICD-10-CM

## 2017-04-23 DIAGNOSIS — K036 Deposits [accretions] on teeth: Secondary | ICD-10-CM

## 2017-04-23 HISTORY — DX: Other specified postprocedural states: Z98.890

## 2017-04-23 HISTORY — DX: Gastro-esophageal reflux disease without esophagitis: K21.9

## 2017-04-23 HISTORY — DX: Hypothyroidism, unspecified: E03.9

## 2017-04-23 HISTORY — DX: Essential (primary) hypertension: I10

## 2017-04-23 NOTE — Progress Notes (Signed)
DENTAL CONSULTATION  Date of Consultation:  04/23/2017 Patient Name:   Nancy [name] Date of Birth:   [birth year] Medical Record Number: [id number]  VITALS: BP 124/77 (BP Location: Left Arm)   Pulse 77   Temp 98.3 F (36.8 C) (Oral)   CHIEF COMPLAINT: Patient referred by Dr. Isidore Moos for a dental consultation.  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  Patient underwent 6 cycles of induction chemotherapy followed by surgical resection, right partial glossectomy,right partial mandibulectomy, right modified neck dissection, and reconstructive surgery with fibula bone graft and plating from the right condyle through the mandibular anterior area. The patient is anticipated to receive adjuvant radiation therapy. Patient is now seen as part of a medically necessary preradiation therapy dental protocol examination.  The patient currently denies acute toothaches, swellings, or abscesses. The patient has not seen a dentist since 1982. Patient had been seeing Dr. Ralph Leyden in [place], [place] for regular exam and cleanings until she lost her dental insurance.  Patient had her wisdom teeth extracted when she was 55 years old. Patient denies complications from those dental extractions. Patient denies having dental phobia.  PROBLEM LIST: Patient Active Problem List   Diagnosis Date Noted  . 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 Squamous cell carcinoma of tongue ([place]) 04/23/2017    PMH: Past Medical History:  Diagnosis Date  . Bipolar 1 disorder ([place])   . Cervical cancer ([place])   . GERD (gastroesophageal reflux disease)   . History of tracheostomy 03/04/2017   She had trach placed during surgery and reversed before discharge [facility])  . Hypertension   . Hypothyroid   . Manic depressive disorder ([place])   . Tongue cancer ([facility])     PSH: Past Surgical History:   Procedure Laterality Date  . bone-skin graft     free osteocutaneous flap with microvascular anastomosis: other than iliac crest, metatarsal or great toe.   . cervical lymphadenectomy  03/04/2017   (modified radical neck dissection) [facility].   . EXCISION OF TONGUE LESION Right 03/04/2017   PR excis tongue, mouth, jaw. [facility])  . exploration, carotid  Left 03/04/2017   [facility].   . GLOSSECTOMY  03/04/2017   Glossectomy; composite WO Radical neck dissect [facility])  . LARYNGOSCOPY  11/22/2016   Laryngoscopy, direct, operative with biopsy, [place]  03/04/2017   Reconstruct mandible/ maxil subperiosteal implant, [facility]  . PARTIAL GLOSSECTOMY    . SKIN FULL THICKNESS GRAFT  03/04/2017   [place]    . VAGINAL HYSTERECTOMY      ALLERGIES: Allergies  Allergen Reactions  . Amoxicillin Hives and Rash  . Iodinated Diagnostic Agents Hives    Pt developed a single hive on the chest. Pt developed a single hive on the chest.   . Augmentin [Amoxicillin-Pot Clavulanate] Rash    MEDICATIONS: Current Outpatient Medications  Medication Sig Dispense Refill  . acetaminophen (TYLENOL) 500 MG tablet Take 1,000 mg by mouth.    . chlorthalidone (HYGROTON) 25 MG tablet     . DEXILANT 60 MG capsule     . lamoTRIgine (LAMICTAL) 150 MG tablet Take 300 mg by mouth daily.    Marland Kitchen levothyroxine (SYNTHROID, LEVOTHROID) 25 MCG tablet     . losartan (COZAAR) 100 MG tablet     . PARoxetine (PAXIL) 40 MG tablet  Take 40 mg by mouth daily.    Marland Kitchen ibuprofen (ADVIL,MOTRIN) 200 MG tablet Take 200 mg by mouth.    Marland Kitchen LORazepam (ATIVAN) 0.5 MG tablet TAKE 1 TABLET BY MOUTH EVERY DAY AS NEEDED FOR ANXIETY  0   No current facility-administered medications for this visit.     LABS: Lab Results  Component Value Date   WBC 8.8 08/07/2007   HGB 14.3 06/08/2008   HCT 42.0 06/08/2008   MCV 92.7 08/07/2007   PLT 219 08/07/2007       Component Value Date/Time   NA 138 06/08/2008 1007   K 4.1 06/08/2008 1007   CL 105 06/08/2008 1007   GLUCOSE 99 06/08/2008 1007   BUN 10 06/08/2008 1007   CREATININE 0.6 06/08/2008 1007   No results found for: INR, PROTIME No results found for: PTT  SOCIAL HISTORY: Social History   Socioeconomic History  . Marital status: Single    Spouse name: Not on file  . Number of children: Not on file  . Years of education: Not on file  . Highest education level: Not on file  Social Needs  . Financial resource strain: Not on file  . Food insecurity - worry: Not on file  . Food insecurity - inability: Not on file  . Transportation needs - medical: Not on file  . Transportation needs - non-medical: Not on file  Occupational History  . Not on file  Tobacco Use  . Smoking status: Never Smoker  . Smokeless tobacco: Never Used  Substance and Sexual Activity  . Alcohol use: No  . Drug use: No  . Sexual activity: No  Other Topics Concern  . Not on file  Social History Narrative  . Not on file    FAMILY HISTORY: History reviewed. No pertinent family history.  REVIEW OF SYSTEMS: Reviewed with the patient as per History of present illness. Psych: Patient denies having dental phobia.  DENTAL HISTORY: CHIEF COMPLAINT: Patient referred by Dr. Isidore Moos for a dental consultation.  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  Patient underwent 6 cycles of induction chemotherapy followed by surgical resection, right partial glossectomy,right partial mandibulectomy, right modified neck dissection, and reconstructive surgery with fibula bone graft and plating from the right condyle through the mandibular anterior area. The patient is anticipated to receive adjuvant radiation therapy. Patient is now seen as part of a medically necessary preradiation therapy dental protocol  examination.  The patient currently denies acute toothaches, swellings, or abscesses. The patient has not seen a dentist since 1982. Patient had been seeing Dr. Ralph Leyden in [place], [place] for regular exam and cleanings until she lost her dental insurance.  Patient had her wisdom teeth extracted when she was 56 years old. Patient denies complications from those dental extractions. Patient denies having dental phobia.  DENTAL EXAMINATION: GENERAL: The patient is a well-developed, well-nourished female in no acute distress. HEAD AND NECK: The right neck is consistent with previous neck dissection and and reversed tracheostomy. The patient has some symptoms of tenderness involving the right temporal mandibular joint area. The maximum interincisal opening is measured at 30 mm. INTRAORAL EXAM: The patient has normal saliva. Patient has a small mid palatal torus. Patient's right lateral tongue, right floor of mouth and right buccal mucosa is consistent with previous surgical resection and flap reconstruction. DENTITION: The patient is missing tooth numbers 1, 16, 17, and 28 through 32.  PERIODONTAL: The patient has chronic periodontitis with plaque and calculus accumulations, selective areas gingival recession and no significant tooth mobility. DENTAL CARIES/SUBOPTIMAL RESTORATIONS: No obvious dental caries are noted. ENDODONTIC:  The patient currently denies acute pulpitis symptoms. I do not see any evidence of periapical pathology or radiolucency. CROWN AND BRIDGE: There are no crown or bridge restorations. PROSTHODONTIC: There are no partial dentures. OCCLUSION:  The patient has a poor occlusal scheme secondary to multiple missing teeth and deviation forward and to the left on maximum opening. The patient appears to be occluding on limited teeth on the anterior and left posterior molar area at this time.  RADIOGRAPHIC INTERPRETATION: An orthopantogram was taken today. There are multiple missing teeth.  There is incipient bone loss noted. No obvious periapical radiolucencies are noted. The right mandible is consistent with previous surgical resection and reconstructive surgery with a plate from the right condyle, ramus, and body of mandible. A fibula bone graft is noted between the angle of the mandible and the distal of #28.   ASSESSMENTS: 1. Squamous cell carcinoma of the right tongue, floor of mouth, and mandible 2. Status post induction chemotherapy followed by surgical resection and reconstruction 3. Preradiation therapy dental protocol 4. Chronic periodontitis with bone loss 5. Gingival recession 6. Accretions 7. Multiple missing teeth 8. Malocclusion 9. Decreased maximum interincisal opening of 30 mm   PLAN/RECOMMENDATIONS: 1. I discussed the risks, benefits, and complications of various treatment options with the patient in relationship to her medical and dental conditions, anticipated radiation therapy, and radiation therapy side effects to include xerostomia, radiation caries, trismus, mucositis, taste changes, gum and jawbone changes, and risk for infection and osteoradionecrosis.  We discussed various treatment options to include no treatment, multiple extractions with alveoloplasty, pre-prosthetic surgery as indicated, periodontal therapy, dental restorations, root canal therapy, crown and bridge therapy, implant therapy, and replacement of missing teeth as indicated. We also discussed fabrication of fluoride trays and a radiation cone locator/tongue positioner. The patient currently wishes to proceed with impressions today for the future fabrication of the radiation cone locator and fluoride trays.  The patient will then proceed with initial periodontal therapy prior to the start of radiation therapy in 2 weeks. The patient currently has been scheduled for fabrication of the tongue positioner on Thursday pending verification of need for radiation therapy with Dr. Isidore Moos.  2.  Discussion of findings with medical team and coordination of future medical and dental care as needed.  I spent in excess of  120 minutes during the conduct of this consultation and >50% of this time involved direct face-to-face encounter for counseling and/or coordination of the patient's care.    Lenn Cal, DDS

## 2017-04-23 NOTE — Patient Instructions (Addendum)
RADIATION THERAPY AND DECISIONS REGARDING YOUR TEETH  Xerostomia (dry mouth) Your salivary glands may be in the filed of radiation.  Radiation may include all or part of your saliva glands.  This will cause your saliva to dry up and you will have a dry mouth.  The dry mouth will be for the rest of your life unless your radiation oncologist tells you otherwise.  Your saliva has many functions:  Saliva wets your tongue for speaking.  It coats your teeth and the inside of your mouth for easier movement.  It helps with chewing and swallowing food.  It helps clean away harmful acid and toxic products made by the germs in your mouth, therefore it helps prevent cavities.  It kills some germs in your mouth and helps to prevent gum disease.  It helps to carry flavor to your taste buds.  Once you have lost your saliva you will be at higher risk for tooth decay and gum disease.  What can be done to help improve your mouth when there's not enough saliva:  1.  Your dentist may give a prescription for Salagen.  It will not bring back all of your saliva but may bring back some of it.  Also your saliva may be thick and ropy or white and foamy. It will not feel like it use to feel.  2.  You will need to swish with water every time your mouth feels dry.  YOU CANNOT suck on any cough drops, mints, lemon drops, candy, vitamin C or any other products.  You cannot use anything other than water to make your mouth feel less dry.  If you want to drink anything else you have to drink it all at once and brush afterwards.  Be sure to discuss the details of your diet habits with your dentist or hygienist.  Radiation caries: This is decay that happens very quickly once your mouth is very dry due to radiation therapy.  Normally cavities take six months to two years to become a problem.  When you have dry mouth cavities may take as little as eight weeks to cause you a problem.  This is why dental check ups every two  months are necessary as long as you have a dry mouth. Radiation caries typically, but not always, start at your gum line where it is hard to see the cavity.  It is therefore also hard to fill these cavities adequately.  This high rate of cavities happens because your mouth no longer has saliva and therefore the acid made by the germs starts the decay process.  Whenever you eat anything the germs in your mouth change the food into acid.  The acid then burns a small hole in your tooth.  This small hole is the beginning of a cavity.  If this is not treated then it will grow bigger and become a cavity.  The way to avoid this hole getting bigger is to use fluoride every evening as prescribed by your dentist.  You have to make sure that your teeth are very clean before you use the fluoride.  This fluoride in turn will strengthen your teeth and prepare them for another day of fighting acid.  If you develop radiation caries many times the damage is so large that you will have to have all your teeth removed.  This could be a big problem if some of these teeth are in the field of radiation.  Further details of why this could be   a big problem will follow.  (See Osteoradionecrosis).  Loss of taste (dysgeusia) This happens to varying degrees once you've had radiation therapy to your jaw region.  Many times taste is not completely lost but becomes limited.  The loss of taste is mostly due to radiation affecting your taste buds.  However if you have no saliva in your mouth to carry the flavor to your taste buds it would be difficult for your taste buds to taste anything.  That is why using water or a prescription for Salagen prior to meals and during meals may help with some of the taste.  Keep in mind that taste generally returns very slowly over the course of several months or several years after radiation therapy.  Don't give up hope.  Trismus According to your Radiation Oncologist your TMJ or jaw joints are going to be  partially or fully in the field of radiation.  This means that over time the muscles that help you open and close your mouth may get stiff.  This will potentially result in your not being able to open your mouth wide enough or as wide as you can open it now.  Le me give you an example of how slowly this happens and how unaware people are of it.  A gentlemen that had radiation therapy two years ago came back to me complaining that bananas are just too large for him to be able to fit them in between his teeth.  He was not able to open wide enough to bite into a banana.  This happens slowly and over a period of time.  What do we do to try and prevent this?  Your dentist will probably give you a stack of sticks called a trismus exercise device .  This stack will help your remind your muscles and your jaw joint to open up to the same distance every day.  Use these sticks every morning when you wake up according to the instructions given by the dentist.   You must use these sticks for at least one to two years after radiation therapy.  The reason for that is because it happens so slowly and keeps going on for about two years after radiation therapy.  Your hospital dentist will help you monitor your mouth opening and make sure that it's not getting smaller.  Osteoradionecrosis (ORN) This is a condition where your jaw bone after having had radiation therapy becomes very dry.  It has very little blood supply to keep it alive.  If you develop a cavity that turns into an abscess or an infection then the jaw bone does not have enough blood supply to help fight the infection.  At this point it is very likely that the infection could cause the death of your jaw bone.  When you have dead bone it has to be removed.  Therefore you might end up having to have surgery to remove part of your jaw bone, the part of the jaw bone that has been affected.   Healing is also a problem if you are to have surgery in the areas where the bone  has had radiation therapy.  The same reasons apply.  If you have surgery you need more blood supply which is not available.  When blood supply and oxygen are not available again, there is a chance for the bone to die.  Occasionally ORN happens on its own with no obvious reason.  This is quite rare.  We believe that   patients who continue to smoke and/or drink alcohol have a higher chance of having this bone problem.  Therefore once your jaw bone has had radiation therapy if there are any teeth in that area, you should never have them pulled.  You should also never have any surgery on your teeth or gums in that area unless the oral surgeon or Periodontist is aware of your history of radiation. There is some expensive management techniques that might be used to limit your risks.  The risks for ORN either from infection or spontaneous ( or on it's own) are life long.    FLUORIDE TRAYS PATIENT INSTRUCTIONS    Obtain prescription from the pharmacy.  Don't be surprised if it needs to be ordered.  Be sure to let the pharmacy know when you are close to needing a new refill for them to have it ready for you without interruption of Fluoride use.  The best time to use your Fluoride is before bed time.  You must brush your teeth very well and floss before using the Fluoride in order to get the best use out of the Fluoride treatments.  Place 1 drop of Fluoride gel per tooth in the tray.  Place the tray on your lower teeth and your upper teeth.  Make sure the trays are seated all the way.  Remember, they only fit one way on your teeth.  Insert for 5 full minutes.  At the end of the 5 minutes, take the trays out.  SPIT OUT excess. .  Do NOT rinse your mouth!  Do NOT eat or drink after treatments for at least 30 minutes.  This is why the best time for your treatments is before bedtime.  Clean the inside of your Fluoride trays using COLD WATER and a toothbrush.  In order to keep your Trays from  discoloring and free from odors, soak them overnight in denture cleaners such as Efferdent.  Do not use bleach or non denture products.  Store the trays in a safe dry place AWAY from any heat until your next treatment.  If anything happens to your Fluoride trays, or they don't fit as well after any dental work, please let us know as soon as possible.

## 2017-04-23 NOTE — Progress Notes (Signed)
Radiation Oncology         [phone] ________________________________  Initial outpatient Consultation  Name: [name] MRN: [id number]  Date: 04/23/2017  DOB: 06/09/1960  SE:[id number], No Pcp Per  Colin Benton, MD   REFERRING PHYSICIAN: Colin Benton, MD  DIAGNOSIS:    ICD-10-CM   1. 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 Screening for hypothyroidism Z13.29    Cancer Staging Squamous cell carcinoma of lateral tongue ([place]) Staging form: Oral Cavity, AJCC 8th Edition - Clinical: Stage IVA (cT4a, cN0, cM0) - Signed by Eppie Gibson, MD on 04/24/2017 - Pathologic: No stage assigned - Unsigned [id number]  CHIEF COMPLAINT: Here to discuss management of her oral cancer  HISTORY OF PRESENT ILLNESS::Nancy [name] is a 56 y.o. female who presented with white lesion on the right lateral tongue in 2004. She underwent a WLE in [place] in 2004 but is unsure as to whether this was benign hyperkeratosis or malignant. [facility] performed multiple resections of right lateral tongue between 2005 and 2008 but all were negative for cancer. She noticed another lesion in 2009 and this was resected by Dr. Dimas Millin at [facility].   In April of 2018, patient noticed a firm bump on lateral tongue accompanied by pain and burning. Dr. Dimas Millin performed a partial glossectomy on 5/30 and all margins were positive for SCC. According to pathology note from [facility], the right tongue tumor size was at least 1.3 cm with a depth of invasion of at least 13 mm. The SCC was moderately differentiated. The margins were positive diffusely. Pathologic stage was pT2 pNx.  Patient was advised for right  hemiglossectomy, radial forearm free flap reconstruction, and bilateral selective neck dissection but sought second opinion at [facility].  Neck through pelvis CT on 11/16/16 showed enhancing mass along lateral right tongue as well as erosive changes of medial aspect of posterior right mandibular body concerning for bony involvement. Biopsy of right mandible on 11/22/16 showed invasive moderately differentiated keratinizing, SCCp16 negative and HPV 16/18 neg by IHC. Lung nodules that were non-specific with recommended follow up. Recommendations were made to see medical oncology at [facility] for induction chemotherapy.   Given involvement with mandible, Dr. Jordan Likes in hematology advised induction chemotherapy prior to surgical resection with carboplatin, abraxane, and cetuximab. This was completed in mid September.  She continued psychiatric appointments through [facility] for bipolar disorder.  Pertinent imaging includes PET scan on 02/11/17 which showed focus of uptake along the right mandible/tongue at the level of cortical erosion, indeterminate. Mildly FDG avid nonenlarged level 2 through 5 bilateral cervical lymph nodes, may be reactive but cannot exclude metastatic disease. Unchanged CT appearance of calcified left lower lobe perifissural nodule with mild uptake, likely benign.   Upon completion of chemotherapy, Dr. [place] Callas performed right composite resection of mandible, floor of mouth, right lateral tongue, neck dissection, left fibular free flap reconstruction, full thickness skin graft, and tracheostomy on 03/04/17. Pathology showed rare microscopic foci of residual viable invasive squamous cell carcinoma grade II maximum span of residual tumor approximately 0.9 cm, 7 mm depth of invasion. Tumor bed with extensive fibrosis, multifocal chronic inflammation, foreign body giant cell  reaction with associated keratinous debris and dystrophic calcifications. Mandible with cortical bone erosion and remodeling  adjacent to tumor bed, without definite residual viable carcinoma adjacent to mandibular bone, and no evidence of viable carcinoma invading cortical bone. Surgical resection margins negative for high-grade dysplasia or invasive carcinoma by at least 8 mm.  All 33 lymph nodes were negative. Negative LVSI, Negative PNI.  Swallowing issues, if any: Yes, endorses difficulty swallowing after most recent surgery. She is eating softer foods and must eat on the left side of her mouth and swallow on left side due to swelling.  Skin rash from immunotherapy.  Weight Changes: no  Tobacco history, if any: none  ETOH abuse, if any: none  Prior cancers, if any: none  On review of systems, patient endorses lymphedema treatment with Clarene Critchley. Owens Shark. She is scheduled to meet with Dr. Enrique Sack as she is having difficulty eating (jaw alignment). 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    PAST SURGICAL HISTORY: Past Surgical History:  Procedure Laterality Date  . bone-skin graft     free osteocutaneous flap with microvascular anastomosis: other than iliac crest, metatarsal or great toe.   . cervical lymphadenectomy  03/04/2017   (modified radical neck dissection) [facility].   . EXCISION OF TONGUE LESION Right 03/04/2017   PR excis tongue, mouth, jaw. [facility])  . exploration, carotid  Left 03/04/2017   [facility].   . GLOSSECTOMY  03/04/2017   Glossectomy; composite WO Radical neck dissect [facility])  . LARYNGOSCOPY  11/22/2016   Laryngoscopy, direct, operative with biopsy, [place]  03/04/2017   Reconstruct mandible/ maxil subperiosteal implant, [facility]  .  PARTIAL GLOSSECTOMY    . SKIN FULL THICKNESS GRAFT  03/04/2017   [place]    . VAGINAL HYSTERECTOMY      FAMILY HISTORY: no related cancers reported  SOCIAL HISTORY:  reports that  has never smoked. she has never used smokeless tobacco. She reports that she does not drink alcohol or use drugs.  ALLERGIES: Amoxicillin; Iodinated diagnostic agents; and Augmentin [amoxicillin-pot clavulanate]  MEDICATIONS:  Current Outpatient Medications  Medication Sig Dispense Refill  . lamoTRIgine (LAMICTAL) 150 MG tablet Take 300 mg by mouth daily.    Marland Kitchen PARoxetine (PAXIL) 40 MG tablet Take 40 mg by mouth daily.    Marland Kitchen acetaminophen (TYLENOL) 500 MG tablet Take 1,000 mg by mouth.    . chlorthalidone (HYGROTON) 25 MG tablet     . DEXILANT 60 MG capsule     . ibuprofen (ADVIL,MOTRIN) 200 MG tablet Take 200 mg by mouth.    . levothyroxine (SYNTHROID, LEVOTHROID) 25 MCG tablet     . LORazepam (ATIVAN) 0.5 MG tablet TAKE 1 TABLET BY MOUTH EVERY DAY AS NEEDED FOR ANXIETY  0  . losartan (COZAAR) 100 MG tablet      No current facility-administered medications for this encounter.     REVIEW OF SYSTEMS:  A 10+ POINT REVIEW OF SYSTEMS WAS OBTAINED including neurology, dermatology, psychiatry, cardiac, respiratory, lymph, extremities, GI, GU, Musculoskeletal, constitutional,  HEENT.  All pertinent positives are noted in the HPI.  All others are negative.    PHYSICAL EXAM:  height is 5\' 5"  (1.651 m) and weight  is 175 lb (79.4 kg). Her temperature is 98.8 F (37.1 C). Her blood pressure is 140/82 and her pulse is 92. Her oxygen saturation is 97%.   General: Alert and oriented, in no acute distress HEENT: Head is normocephalic. Extraocular movements are intact. Oropharynx is notable for tissue graft that doesn't reach midline of oral tongue but extends to the floor mouth and along the right alveolar ridge. She has impressive tongue mobility. Tip of tongue does not involve the graft.  Neck:  scars healed well. Neck is notable for postoperative lymphedema along the anterior and right neck. No palpable nodes. She has a erythematous, spotted rash over her face. Heart: Regular in rate and rhythm with no murmurs, rubs, or gallops.  Chest: Clear to auscultation bilaterally, with no rhonchi, wheezes, or rales.  Abdomen: Soft, nontender, nondistended, with no rigidity or guarding. Extremities: No cyanosis or edema in right ankle. Baseline swelling in her left ankle laterally.  Lymphatics: see Neck Exam Skin: No concerning lesions. She has a bandage over the left lower leg from her prior surgery. Musculoskeletal: symmetric strength and muscle tone throughout. Neurologic: Cranial nerves II through XII are grossly intact. No obvious focalities. Speech is fluent. Coordination is intact. Psychiatric: Judgment and insight are intact. Affect is appropriate.  ECOG = 1  0 - Asymptomatic (Fully active, able to carry on all predisease activities without restriction)  1 - Symptomatic but completely ambulatory (Restricted in physically strenuous activity but ambulatory and able to carry out work of a light or sedentary nature. For example, light housework, office work)  2 - Symptomatic, <50% in bed during the day (Ambulatory and capable of all self care but unable to carry out any work activities. Up and about more than 50% of waking hours)  3 - Symptomatic, >50% in bed, but not bedbound (Capable of only limited self-care, confined to bed or chair 50% or more of waking hours)  4 - Bedbound (Completely disabled. Cannot carry on any self-care. Totally confined to bed or chair)  5 - Death   Eustace Pen MM, Creech RH, Tormey DC, et al. [phone]). "Toxicity and response criteria of the [facility]". [place] Oncol. 5 (6): 649-55   LABORATORY DATA:  Lab Results  Component Value Date   WBC 8.8 08/07/2007   HGB 14.3 06/08/2008   HCT 42.0 06/08/2008   MCV 92.7 08/07/2007   PLT 219  08/07/2007   CMP     Component Value Date/Time   NA 138 06/08/2008 1007   K 4.1 06/08/2008 1007   CL 105 06/08/2008 1007   GLUCOSE 99 06/08/2008 1007   BUN 10 06/08/2008 1007   CREATININE 0.6 06/08/2008 1007     No results found for: TSH    RADIOGRAPHY:  As above    IMPRESSION/PLAN: tongue cancer.  This is a delightful patient with head and neck cancer. I recommend radiotherapy over 6 weeks for this patient. Although she had a good response to induction chemotherapy with a successful surgery, she had locally advanced disease invading her bone prior to the induction therapy and I still think she is a significant risk of recurrence in a cancer that has shown a history of multiple recurrences.  We discussed the potential risks, benefits, and side effects of radiotherapy. We talked in detail about acute and late effects. We discussed that some of the most bothersome acute effects may be mucositis, dysgeusia, salivary changes, skin irritation, hair loss, dehydration, weight loss and fatigue. We talked about  late effects which include but are not necessarily limited to dysphagia, hypothyroidism, nerve injury, spinal cord injury, xerostomia, trismus, and neck edema. No guarantees of treatment were given. A consent form was signed and placed in the patient's medical record. The patient is enthusiastic about proceeding with treatment. I look forward to participating in the patient's care.    I would like to touch base with Dr. [place] Callas regarding the tumor board discussion at [facility] to close the gap on communication concerning adjuvant therapy recommendations.  I also need to view her pre-op PET scan which is not accessible today. CTs were available.  Simulation (treatment planning) will take place after clearance by Dental Medicine  We also discussed that the treatment of head and neck cancer is a multidisciplinary process to maximize treatment outcomes and quality of life. For this reasons the  following referrals have been or will be made:   Dentistry for dental evaluation, possible extractions in the radiation fields, and /or advice on reducing risk of cavities, osteoradionecrosis, or other oral issues.   Nutritionist for nutrition support during and after treatment.   Speech language pathology for swallowing and/or speech therapy.   Social work for social support.    Physical therapy due to risk of lymphedema in neck and deconditioning.   Baseline labs   I spent 60 minutes face to face with the patient, over 50% on counseling and coordination of care. __________________________________________   Eppie Gibson, MD   This document serves as a record of services personally performed by Eppie Gibson, MD. It was created on his behalf by Linward Natal, a trained medical scribe. The creation of this record is based on the scribe's personal observations and the provider's statements to them. This document has been checked and approved by the attending provider.

## 2017-04-24 ENCOUNTER — Encounter: Payer: Self-pay | Admitting: Radiation Oncology

## 2017-04-24 ENCOUNTER — Other Ambulatory Visit: Payer: Self-pay | Admitting: Radiation Oncology

## 2017-04-24 DIAGNOSIS — C321 Malignant neoplasm of supraglottis: Secondary | ICD-10-CM

## 2017-04-24 DIAGNOSIS — C021 Malignant neoplasm of border of tongue: Secondary | ICD-10-CM | POA: Insufficient documentation

## 2017-04-25 ENCOUNTER — Ambulatory Visit (HOSPITAL_COMMUNITY): Payer: Self-pay | Admitting: Dentistry

## 2017-04-25 ENCOUNTER — Telehealth: Payer: Self-pay | Admitting: Radiation Oncology

## 2017-04-25 ENCOUNTER — Encounter: Payer: Self-pay | Admitting: General Practice

## 2017-04-25 ENCOUNTER — Encounter: Payer: Self-pay | Admitting: *Deleted

## 2017-04-25 ENCOUNTER — Encounter (HOSPITAL_COMMUNITY): Payer: Self-pay | Admitting: Dentistry

## 2017-04-25 VITALS — BP 129/74 | HR 86 | Temp 97.6°F

## 2017-04-25 DIAGNOSIS — Z463 Encounter for fitting and adjustment of dental prosthetic device: Secondary | ICD-10-CM

## 2017-04-25 DIAGNOSIS — C049 Malignant neoplasm of floor of mouth, unspecified: Secondary | ICD-10-CM

## 2017-04-25 DIAGNOSIS — Z01818 Encounter for other preprocedural examination: Secondary | ICD-10-CM | POA: Diagnosis not present

## 2017-04-25 DIAGNOSIS — C029 Malignant neoplasm of tongue, unspecified: Secondary | ICD-10-CM

## 2017-04-25 MED ORDER — SODIUM FLUORIDE 1.1 % DT GEL: DENTAL | 99 refills | Status: AC

## 2017-04-25 MED FILL — FLUORISHIELD 1.1% GEL: 1.1 % | 30 days supply | Qty: 114 | Fill #0

## 2017-04-25 NOTE — Progress Notes (Signed)
Oncology Nurse Navigator Documentation  Met with pt and her stepmother s/p Dental Medicine appt, provided [place] Information Sheet, confirmed their understanding of attendance next Tuesday morning and 0800 arrival to Radiation Waiting following lobby registration.  I explained CT SIM to be scheduled, will notify them of appt.  Gayleen Orem, RN, BSN, [place] Neck Oncology Nurse [place] at [place] [phone]

## 2017-04-25 NOTE — Telephone Encounter (Signed)
Left message for Social Worker Fara Chute  to call patient to schedule appt

## 2017-04-25 NOTE — Telephone Encounter (Signed)
Spoke with patient re appt  °

## 2017-04-25 NOTE — Progress Notes (Signed)
[place] CSW Progress Note  CSW spoke w patient by phone after referral from Dr Lanell Persons.  Pt has new diagnosis of tongue cancer, is initiating treatment.  Has been in treatment for Bipolar 1 Disorder at [facility] for 12 years, finds it difficult to get to appointments due to distance.  Wants to discuss possible referral for more local provider also wants information on managing distress related to cancer treatment.  CSW arranged to meet w patient on 12/17 at 10:30 AM, following visit w nutritionist.  Presented available resources at [facility], mailed information packet.  Edwyna Shell, LCSW Clinical Social Worker Phone:  [phone]

## 2017-04-25 NOTE — Patient Instructions (Signed)

## 2017-04-25 NOTE — Progress Notes (Signed)
04/25/2017  Patient Name:   Nancy [name] Date of Birth:   1960-05-26 Medical Record Number: [id number]  BP 129/74 (BP Location: Left Arm)   Pulse 86   Temp 97.6 F (36.4 C) (Oral)   [name] now presents for continued fabrication of the tongue positioner along with insertion of upper and lower fluoride trays. Initial fabrication of a tongue positioner was completed yesterday with 2 hours of lab time. Patient now presents for the continued fabrication of the tongue positioner chairside.  PROCEDURE: Tongue positioner was tried in and adjusted as needed. Additional Triad acrylic material was added to the left side of the posterior occlusion at an increased vertical dimension. Additional increments were added and adjustments were made as needed.   The fabrication of the tongue positioner was complicated by the significant amount of postsurgical defect of the tongue and malocclusion that presented after this surgery. Bilateral stability of the tongue positioner was unable to be obtained secondary to loss of teeth in the lower right posterior quadrant with the cancer resection. The tongue positioner fabricated was stable, however, and the patient was instructed on inserting and removing the appliance as needed. Patient expressed understanding with the plan of care and use of the appliance.  Fluoride trays were tried in and adjusted as needed. [name] Postop instructions were provided in a written and verbal format concerning the use and care of appliances. A prescription for FluoriSHIELD was sent to [facility] with refills for one year. The normal instructions were provided on the prescription for use at bedtime on a nightly basis. All questions were answered. The patient is to return to clinic early next week for initial periodontal therapy.  Patient to call if questions or problems arise before then.  Lenn Cal, DDS

## 2017-04-26 ENCOUNTER — Telehealth: Payer: Self-pay | Admitting: *Deleted

## 2017-04-26 NOTE — Progress Notes (Signed)
error 

## 2017-04-26 NOTE — Telephone Encounter (Signed)
Oncology Nurse Navigator Documentation  Called pt to inform her of 12/12 11:00 CT SIM, reminded her of 12/11 [place] with 0800 arrival to Radiation Waiting following lobby registration.  She voiced understanding.  Gayleen Orem, RN, BSN, [place] Neck Oncology Nurse [place] at [place] [phone]

## 2017-04-29 ENCOUNTER — Encounter (HOSPITAL_COMMUNITY): Payer: Self-pay | Admitting: Dentistry

## 2017-04-29 ENCOUNTER — Telehealth: Payer: Self-pay | Admitting: *Deleted

## 2017-04-29 NOTE — Progress Notes (Signed)
Oncology Nurse Navigator Documentation  Met with Ms. Stave during initial consult with Dr. Isidore Moos.  She was accompanied by her step-mother and dad.   1. Further introduced myself as their Navigator, explained my role as a member of the Care Team.   2. Provided New Patient Information packet, discussed contents:  Contact information for physician(s), myself, other members of the Care Team.  Advance Directive information ([place] blue pamphlet with LCSW contact info).  They indicated ADs completed, will bring copy.  Fall Prevention Patient Safety Plan  Appointment Guideline  Financial Assistance Information sheet  [place] with highlight of [place]  SLP information sheet 3. Provided introductory explanation of radiation treatment including SIM planning and purpose of Aquaplast head and shoulder mask, showed them example.   4. Escorted them to [place] for appt. 5. I encouraged them to contact me with questions/concerns as treatments/procedures begin.  They verbalized understanding of information provided.    Gayleen Orem, RN, BSN, [place] Neck Oncology Nurse [place] at [place] [phone]

## 2017-04-29 NOTE — Telephone Encounter (Signed)
Oncology Nurse Navigator Documentation  Received call from Ms. Edmundson asking about status of tomorrow's H&N MDC. She stated she has small car that does not travel well in snow, her car and street presently snow-covered. I indicated appts with SLP, Nutrition and SW can be rescheduled. She voiced confidence able to arrive for 12/12 11:00 CT SIM appt.  Gayleen Orem, RN, BSN, [place] Neck Oncology Nurse [place] at [place] [phone]

## 2017-04-30 ENCOUNTER — Encounter (HOSPITAL_COMMUNITY): Payer: Self-pay | Admitting: Dentistry

## 2017-04-30 ENCOUNTER — Ambulatory Visit (HOSPITAL_COMMUNITY): Payer: Self-pay | Admitting: Dentistry

## 2017-04-30 ENCOUNTER — Ambulatory Visit: Payer: Medicare Other

## 2017-04-30 ENCOUNTER — Ambulatory Visit: Payer: Medicare Other | Attending: Radiation Oncology | Admitting: Physical Therapy

## 2017-04-30 DIAGNOSIS — R1312 Dysphagia, oropharyngeal phase: Secondary | ICD-10-CM | POA: Insufficient documentation

## 2017-04-30 DIAGNOSIS — R262 Difficulty in walking, not elsewhere classified: Secondary | ICD-10-CM | POA: Insufficient documentation

## 2017-04-30 DIAGNOSIS — I890 Lymphedema, not elsewhere classified: Secondary | ICD-10-CM | POA: Insufficient documentation

## 2017-04-30 DIAGNOSIS — M25511 Pain in right shoulder: Secondary | ICD-10-CM | POA: Insufficient documentation

## 2017-04-30 DIAGNOSIS — M6281 Muscle weakness (generalized): Secondary | ICD-10-CM | POA: Insufficient documentation

## 2017-04-30 DIAGNOSIS — Z483 Aftercare following surgery for neoplasm: Secondary | ICD-10-CM | POA: Insufficient documentation

## 2017-05-01 ENCOUNTER — Encounter (HOSPITAL_COMMUNITY): Payer: Self-pay | Admitting: Dentistry

## 2017-05-01 ENCOUNTER — Ambulatory Visit (HOSPITAL_COMMUNITY): Payer: Self-pay | Admitting: Dentistry

## 2017-05-01 ENCOUNTER — Ambulatory Visit
Admission: RE | Admit: 2017-05-01 | Discharge: 2017-05-01 | Disposition: A | Discharge status: Home / Self Care | Payer: Medicare Other | Source: Ambulatory Visit | Attending: Radiation Oncology | Admitting: Radiation Oncology

## 2017-05-01 ENCOUNTER — Encounter: Payer: Self-pay | Admitting: Physical Therapy

## 2017-05-01 ENCOUNTER — Ambulatory Visit: Payer: Medicare Other | Admitting: Physical Therapy

## 2017-05-01 ENCOUNTER — Other Ambulatory Visit: Payer: Self-pay

## 2017-05-01 VITALS — BP 121/79 | HR 79 | Temp 98.2°F

## 2017-05-01 DIAGNOSIS — Z01818 Encounter for other preprocedural examination: Secondary | ICD-10-CM

## 2017-05-01 DIAGNOSIS — R262 Difficulty in walking, not elsewhere classified: Secondary | ICD-10-CM

## 2017-05-01 DIAGNOSIS — M25511 Pain in right shoulder: Secondary | ICD-10-CM

## 2017-05-01 DIAGNOSIS — M6281 Muscle weakness (generalized): Secondary | ICD-10-CM

## 2017-05-01 DIAGNOSIS — C049 Malignant neoplasm of floor of mouth, unspecified: Secondary | ICD-10-CM

## 2017-05-01 DIAGNOSIS — R1312 Dysphagia, oropharyngeal phase: Secondary | ICD-10-CM | POA: Diagnosis present

## 2017-05-01 DIAGNOSIS — C021 Malignant neoplasm of border of tongue: Secondary | ICD-10-CM

## 2017-05-01 DIAGNOSIS — Z483 Aftercare following surgery for neoplasm: Secondary | ICD-10-CM | POA: Diagnosis not present

## 2017-05-01 DIAGNOSIS — K0530 Chronic periodontitis, unspecified: Secondary | ICD-10-CM

## 2017-05-01 DIAGNOSIS — Z510 Encounter for antineoplastic radiation therapy: Secondary | ICD-10-CM | POA: Diagnosis not present

## 2017-05-01 DIAGNOSIS — I890 Lymphedema, not elsewhere classified: Secondary | ICD-10-CM | POA: Diagnosis present

## 2017-05-01 DIAGNOSIS — C029 Malignant neoplasm of tongue, unspecified: Secondary | ICD-10-CM

## 2017-05-01 DIAGNOSIS — K036 Deposits [accretions] on teeth: Secondary | ICD-10-CM

## 2017-05-01 NOTE — Progress Notes (Signed)
05/01/2017  Patient:            Nancy [name] Date of Birth:  1960-06-05 MRN:                [id number]   BP 121/79 (BP Location: Left Arm)   Pulse 79   Temp 98.2 F (36.8 C) (Oral)   Marlise Eves presents for initial periodontal therapy. Patient has just completed her simulation with radiation oncology. Patient is scheduled to start radiation therapy on 05/06/2017. Patient denies any acute medical or dental changes.  Procedure: D1110  Adult prophylaxis with Cavitron and hand instruments.              Polished.  Floss.  Oral hygiene instruction.  Patient tolerated the procedure well.  Plan/recommendations 1.  Brush teeth after meals and at bedtime.  Floss at bedtime.   2.  Use fluoride in the fluoride trays as instructed on a nightly basis. 3.  Return to clinic in 2-3 weeks for oral examination during radiation therapy. 4.  Call if problems arise.   Lenn Cal, DDS

## 2017-05-01 NOTE — Progress Notes (Addendum)
Head and Neck Cancer Simulation, IMRT treatment planning procedure note   Outpatient  Diagnosis:    ICD-10-CM   1. 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  Tumor board at [facility] recommended RT to the tumor bed, but not empiric nodes.  He and I discussed the pros and cons of RT to the nodal echelons and I decided not to pursue this given that she is at very low risk of a neck failure. Will only treat the tumor bed.  The patient was taken to the CT simulator and laid in the supine position on the table. An Aquaplast head and shoulder mask was custom fitted to the patient's anatomy. High-resolution CT axial imaging was obtained of the head and neck with contrast. I verified that the quality of the imaging is good for treatment planning. 1 Medically Necessary Treatment Device was fabricated and supervised by me: Aquaplast mask.   Treatment planning note I plan to treat the patient with IMRT. I plan to treat the patient's tumor bed with margin.. I plan to treat to a total dose of 60 Gray in 30  fractions. Dose calculation was ordered from dosimetry.  IMRT planning Note  IMRT is medically necessary and an important modality to deliver adequate dose to the patient's at risk tissues while sparing the patient's normal structures, including the: esophagus, parotid tissue, mandible, brain stem, spinal cord, oral cavity, larynx. This justifies the use of IMRT in the patient's treatment.      -----------------------------------  Eppie Gibson, MD

## 2017-05-01 NOTE — Patient Instructions (Signed)
Plan/recommendations 1.  Brush teeth after meals and at bedtime.  Floss at bedtime.   2.  Use fluoride in the fluoride trays as instructed on a nightly basis. 3.  Return to clinic in 2-3 weeks for oral examination during radiation therapy. 4.  Call if problems arise.   Lenn Cal, DDS

## 2017-05-01 NOTE — Therapy (Signed)
Nancy [name], [place], [address] Phone: [phone]   Fax:  [phone]  Physical Therapy Treatment  Patient Details  Name: Nancy [name] MRN: [id number] Date of Birth: [birth year] Referring Provider: Dr. Colin Benton   Encounter Date: 05/01/2017  PT End of Session - 05/01/17 1544    Visit Number  2    Number of Visits  18    Date for PT Re-Evaluation  06/19/16    PT Start Time  6962    PT Stop Time  1530    PT Time Calculation (min)  45 min    Activity Tolerance  Patient tolerated treatment well    Behavior During Therapy  [facility] for tasks assessed/performed       Past Medical History:  Diagnosis Date  . Bipolar 1 disorder ([place])   . Cervical cancer ([place])   . GERD (gastroesophageal reflux disease)   . History of tracheostomy 03/04/2017   She had trach placed during surgery and reversed before discharge [facility])  . Hypertension   . Hypothyroid   . Manic depressive disorder ([place])   . Tongue cancer [facility])     Past Surgical History:  Procedure Laterality Date  . bone-skin graft     free osteocutaneous flap with microvascular anastomosis: other than iliac crest, metatarsal or great toe.   . cervical lymphadenectomy  03/04/2017   (modified radical neck dissection) [facility]   . EXCISION OF TONGUE LESION Right 03/04/2017   PR excis tongue, mouth, jaw. [facility])  . exploration, carotid  Left 03/04/2017   [facility].   . GLOSSECTOMY  03/04/2017   Glossectomy; composite WO Radical neck dissect [facility])  . LARYNGOSCOPY  11/22/2016   Laryngoscopy, direct, operative with biopsy, [place]  03/04/2017   Reconstruct mandible/ maxil subperiosteal implant, [facility]  . PARTIAL GLOSSECTOMY    . SKIN FULL THICKNESS GRAFT  03/04/2017   [place]    . VAGINAL HYSTERECTOMY      There were no vitals filed for this  visit.  Subjective Assessment - 05/01/17 1455    Subjective  Radiation is starting Monday.  She has not had her compression garment measured yet due to the snow .  Her leg has not been swelling and she has been wearing her boot     Pertinent History  chemo from july til sept 28. Oct 15 surgery for neck dissection, removal tongue and jaw with reconsturction with artery, skin and fibula from left leg. She has skin graft on left leg and has pain and swelling in leg.  She had physical therapy at home up until last week and is still getting home health RN for wound care  they took 53 lymph nodes and she has had swelling and decreased range of motion of her nedk and right arm     Patient Stated Goals  to get full range of motion of neck and shoulder and get rid of pain in leg and get help with swelling in her neck     Currently in Pain?  No/denies         [facility] PT Assessment - 05/01/17 0001      Functional Tests   Functional tests  Sit to Stand      Sit to Stand   Comments  14 repetitions in 30 seconds  audible knee crepitus       AROM   Overall AROM  Comments  Right grip strength 35/40/40   left grip 48/45/40       Timed Up and Go Test   Normal TUG (seconds)  6.91        LYMPHEDEMA/ONCOLOGY QUESTIONNAIRE - 05/01/17 1502      Head and Neck   4 cm superior to sternal notch around neck  37 cm    6 cm superior to sternal notch around neck  36 cm    8 cm superior to sternal notch around neck  36 cm               [facility] Adult PT Treatment/Exercise - 05/01/17 0001      Shoulder Exercises: Supine   Protraction  AROM;Right;10 reps    Other Supine Exercises  meeks decompression x 3 reps  pt with pain in anterior shoudler with stretch     Other Supine Exercises  small circles in each direction              PT Education - 05/01/17 1543    Education provided  Yes    Education Details  meeks decompression     Person(s) Educated  Patient    Methods  Explanation;Handout     Comprehension  Verbalized understanding;Returned demonstration       PT Short Term Goals - 04/19/17 1311      PT SHORT TERM GOAL #1   Title  Pt will be independent in self manual lymph draiange and use of compression to manage neck lymphedema     Time  4    Period  Weeks    Status  New      PT SHORT TERM GOAL #2   Title  Pt will be independent in a home exercise program for strength of UE and LE  and neck range of motion     Time  4    Period  Weeks    Status  New      PT SHORT TERM GOAL #3   Title  Pt will increase active range of motin of right atm to 130 indicating increase in UE strength so that she can perfom household chores easier     Time  4    Period  Weeks    Status  New      PT SHORT TERM GOAL #4   Title  Pt will report a decrease in overall pain by 25%    Time  4    Period  Weeks    Status  New      PT SHORT TERM GOAL #5   Title  Pt will increase right grip strength by an average of 10#     Time  4    Period  Weeks    Status  New        PT Long Term Goals - 04/19/17 1314      PT LONG TERM GOAL #1   Title  Pt will decrease quick DASH score to less than 38 indicating a functional improvment in left arm use     Time  8    Period  Weeks    Status  New      PT LONG TERM GOAL #2   Title  Pt will increase 30 second sit to stand by 2 repetitions showing improvment in functional strength    Time  8    Period  Weeks    Status  New      PT LONG TERM GOAL #3  Title  pt will decrease TUG score by 2 seconds indicating an improvement in functional gait and balance.     Time  8    Period  Weeks    Status  New      PT LONG TERM GOAL #4   Title  Pt will report a decrease in pain by 50%     Time  8    Period  Weeks    Status  New            Plan - 05/01/17 1544    Clinical Impression Statement  Pt has had marked improvment in her neck fibrosis and general strength since last visit  She still has pain and weakness in right shoulder as well as left  leg. Completed eval today     Rehab Potential  Good    Clinical Impairments Affecting Rehab Potential  extensive surgery     PT Frequency  2x / week    PT Duration  8 weeks    PT Next Visit Plan  Perform neck MLD with self instuciton ROM,  progress shoulder exercise with supine scapular series For leg, check to see if pt brings in boot and treat edema with compression if indicated and progress exercise for leg strength and gait training.     Consulted and Agree with Plan of Care  Patient       Patient will benefit from skilled therapeutic intervention in order to improve the following deficits and impairments:  Abnormal gait, Decreased endurance, Decreased skin integrity, Impaired sensation, Increased edema, Decreased scar mobility, Decreased knowledge of precautions, Decreased activity tolerance, Decreased knowledge of use of DME, Decreased strength, Increased fascial restricitons, Impaired UE functional use, Pain, Difficulty walking, Decreased mobility, Decreased balance, Decreased range of motion, Impaired perceived functional ability, Postural dysfunction  Visit Diagnosis: Aftercare following surgery for neoplasm  Muscle weakness (generalized)  Acute pain of right shoulder  Lymphedema, not elsewhere classified  Difficulty in walking, not elsewhere classified     Problem List Patient Active Problem List   Diagnosis Date Noted  . 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 Squamous cell carcinoma of tongue ([place]) 04/23/2017   Donato Heinz. Owens Shark PT  Norwood Levo 05/01/2017, 3:47 PM  [place], [place], [address] Phone: [phone]   Fax:  [phone]  Name: VIENNE CORCORAN MRN: [id number] Date of Birth: 08-08-1960

## 2017-05-01 NOTE — Patient Instructions (Signed)

## 2017-05-02 ENCOUNTER — Encounter: Payer: Self-pay | Admitting: Radiation Oncology

## 2017-05-02 NOTE — Progress Notes (Signed)
Rec'd from [place], Therapist, sports, paperwork for [employer].

## 2017-05-03 ENCOUNTER — Other Ambulatory Visit: Payer: Self-pay

## 2017-05-03 ENCOUNTER — Encounter: Payer: Self-pay | Admitting: Physical Therapy

## 2017-05-03 ENCOUNTER — Ambulatory Visit: Payer: Medicare Other | Admitting: Physical Therapy

## 2017-05-03 ENCOUNTER — Telehealth: Payer: Self-pay | Admitting: *Deleted

## 2017-05-03 DIAGNOSIS — Z483 Aftercare following surgery for neoplasm: Secondary | ICD-10-CM

## 2017-05-03 DIAGNOSIS — I890 Lymphedema, not elsewhere classified: Secondary | ICD-10-CM

## 2017-05-03 DIAGNOSIS — M25511 Pain in right shoulder: Secondary | ICD-10-CM

## 2017-05-03 DIAGNOSIS — M6281 Muscle weakness (generalized): Secondary | ICD-10-CM

## 2017-05-03 DIAGNOSIS — R262 Difficulty in walking, not elsewhere classified: Secondary | ICD-10-CM

## 2017-05-03 DIAGNOSIS — Z510 Encounter for antineoplastic radiation therapy: Secondary | ICD-10-CM | POA: Diagnosis not present

## 2017-05-03 NOTE — Therapy (Signed)
[place], [place], [address] Phone: [phone]   Fax:  [phone]  Physical Therapy Treatment  Patient Details  Name: Nancy [name] MRN: [id number] Date of Birth: 11/06/1960 Referring Provider: Dr. Colin Benton   Encounter Date: 05/03/2017  PT End of Session - 05/03/17 1212    Visit Number  3    Number of Visits  18    Date for PT Re-Evaluation  06/19/16    PT Start Time  0942 pt arrived late     PT Stop Time  1017    PT Time Calculation (min)  35 min    Activity Tolerance  Patient tolerated treatment well    Behavior During Therapy  [facility] for tasks assessed/performed       Past Medical History:  Diagnosis Date  . Bipolar 1 disorder ([place])   . Cervical cancer ([place])   . GERD (gastroesophageal reflux disease)   . History of tracheostomy 03/04/2017   She had trach placed during surgery and reversed before discharge [facility])  . Hypertension   . Hypothyroid   . Manic depressive disorder ([place])   . Tongue cancer [facility])     Past Surgical History:  Procedure Laterality Date  . bone-skin graft     free osteocutaneous flap with microvascular anastomosis: other than iliac crest, metatarsal or great toe.   . cervical lymphadenectomy  03/04/2017   (modified radical neck dissection) [facility].   . EXCISION OF TONGUE LESION Right 03/04/2017   PR excis tongue, mouth, jaw. [facility])  . exploration, carotid  Left 03/04/2017   [facility].   . GLOSSECTOMY  03/04/2017   Glossectomy; composite WO Radical neck dissect [facility])  . LARYNGOSCOPY  11/22/2016   Laryngoscopy, direct, operative with biopsy, [place]  03/04/2017   Reconstruct mandible/ maxil subperiosteal implant, [facility]  . PARTIAL GLOSSECTOMY    . SKIN FULL THICKNESS GRAFT  03/04/2017   [place]    . VAGINAL HYSTERECTOMY      There were no vitals filed for  this visit.  Subjective Assessment - 05/03/17 0949    Subjective  Pt states she did snow shoveling three days ago  She feels that her body is achy all over.  She still has pain and popping in left shoulder She     Pertinent History  chemo from july til sept 28. Oct 15 surgery for neck dissection, removal tongue and jaw with reconsturction with artery, skin and fibula from left leg. She has skin graft on left leg and has pain and swelling in leg.  She had physical therapy at home up until last week and is still getting home health RN for wound care  they took 58 lymph nodes and she has had swelling and decreased range of motion of her nedk and right arm     Patient Stated Goals  to get full range of motion of neck and shoulder and get rid of pain in leg and get help with swelling in her neck     Currently in Pain?  Yes    Pain Score  2     Pain Location  -- all over                       [facility] Adult PT Treatment/Exercise - 05/03/17 0001      Lumbar Exercises: Supine   Ab Set  10  reps    Clam  10 reps    Heel Slides  10 reps    Bent Knee Raise  10 reps    Bridge  10 reps    Other Supine Lumbar Exercises  pelvic tilt with pulling up of lower abdominal floor.       Shoulder Exercises: Supine   Protraction  AROM;Right;10 reps    Horizontal ABduction  AROM;Right    Horizontal ABduction Limitations  only partial range with guarding to prevent arm from falling     Other Supine Exercises  small circles in each direction                PT Short Term Goals - 04/19/17 1311      PT SHORT TERM GOAL #1   Title  Pt will be independent in self manual lymph draiange and use of compression to manage neck lymphedema     Time  4    Period  Weeks    Status  New      PT SHORT TERM GOAL #2   Title  Pt will be independent in a home exercise program for strength of UE and LE  and neck range of motion     Time  4    Period  Weeks    Status  New      PT SHORT TERM GOAL #3    Title  Pt will increase active range of motin of right atm to 130 indicating increase in UE strength so that she can perfom household chores easier     Time  4    Period  Weeks    Status  New      PT SHORT TERM GOAL #4   Title  Pt will report a decrease in overall pain by 25%    Time  4    Period  Weeks    Status  New      PT SHORT TERM GOAL #5   Title  Pt will increase right grip strength by an average of 10#     Time  4    Period  Weeks    Status  New        PT Long Term Goals - 04/19/17 1314      PT LONG TERM GOAL #1   Title  Pt will decrease quick DASH score to less than 38 indicating a functional improvment in left arm use     Time  8    Period  Weeks    Status  New      PT LONG TERM GOAL #2   Title  Pt will increase 30 second sit to stand by 2 repetitions showing improvment in functional strength    Time  8    Period  Weeks    Status  New      PT LONG TERM GOAL #3   Title  pt will decrease TUG score by 2 seconds indicating an improvement in functional gait and balance.     Time  8    Period  Weeks    Status  New      PT LONG TERM GOAL #4   Title  Pt will report a decrease in pain by 50%     Time  8    Period  Weeks    Status  New            Plan - 05/03/17 1212    Clinical Impression Statement  Pt reports  increased pain today, but feels it might be from shoveling snow.  She was able to perform exercises today, but was surprised that they were a challenge to her.  Weakness around that scapula and anterior chest evident today . Contacted Cathy Rubel and head and neck garment will be ordered     Rehab Potential  Good    Clinical Impairments Affecting Rehab Potential  extensive surgery     PT Frequency  2x / week    PT Duration  8 weeks    PT Treatment/Interventions  ADLs/Self Care Home Management;Therapeutic activities;Therapeutic exercise;Taping;Manual techniques;Manual lymph drainage;Compression bandaging;Passive range of motion;Scar  mobilization;Patient/family education;Functional mobility training;DME Instruction;Balance training;Neuromuscular re-education;Gait training;Stair training    PT Next Visit Plan  Perform neck MLD with self instuciton ROM,  progress shoulder exercise with supine scapular series For leg, check to see if pt brings in boot and treat edema with compression if indicated and progress exercise for leg strength and gait training.     Consulted and Agree with Plan of Care  Patient       Patient will benefit from skilled therapeutic intervention in order to improve the following deficits and impairments:  Abnormal gait, Decreased endurance, Decreased skin integrity, Impaired sensation, Increased edema, Decreased scar mobility, Decreased knowledge of precautions, Decreased activity tolerance, Decreased knowledge of use of DME, Decreased strength, Increased fascial restricitons, Impaired UE functional use, Pain, Difficulty walking, Decreased mobility, Decreased balance, Decreased range of motion, Impaired perceived functional ability, Postural dysfunction  Visit Diagnosis: Aftercare following surgery for neoplasm  Muscle weakness (generalized)  Acute pain of right shoulder  Lymphedema, not elsewhere classified  Difficulty in walking, not elsewhere classified     Problem List Patient Active Problem List   Diagnosis Date Noted  . 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 Squamous cell carcinoma of tongue ([place]) 04/23/2017   Donato Heinz. Owens Shark PT  Norwood Levo 05/03/2017, 12:25 PM  [place], [place], [address] Phone: [phone]   Fax:  [phone]  Name: Nancy [name] MRN: [id number] Date of Birth: 13-Sep-1960

## 2017-05-03 NOTE — Telephone Encounter (Signed)
Oncology Nurse Navigator Documentation  Returned pt's VM, clarified 12/7 appts including rescheduled lab at 11:30.  Gayleen Orem, RN, BSN, [place] Neck Oncology Nurse [place] [facility] [place] [phone]

## 2017-05-03 NOTE — Patient Instructions (Signed)
  PELVIC TILT  Lie on back, legs bent. Exhale, tilting top of pelvis back, pubic bone up, to flatten lower back. Inhale, rolling pelvis opposite way, top forward, pubic bone down, arch in back. Repeat __10__ times. Do __2__ sessions per day. Copyright  VHI. All rights reserved.     Lie with hips and knees bent. Slowly inhale, and then exhale. Pull navel toward spine and tighten pelvic floor. Hold for __10_ seconds. Continue to breathe in and out during hold. Rest for _10__ seconds. Repeat __10_ times. Do __2-3_ times a day.   Copyright  VHI. All rights reserved.  Knee Fold   Lie on back, legs bent, arms by sides. Exhale, lifting knee to chest. Inhale, returning. Keep abdominals flat, navel to spine. Repeat __10__ times, alternating legs. Do __2__ sessions per day.  Copyright  VHI. All rights reserved.  Knee Drop   Keep pelvis stable. Without rotating hips, slowly drop knee to side, pause, return to center, bring knee across midline toward opposite hip. Feel obliques engaging. Repeat for ___10_ times each leg.  Copyright  VHI. All rights reserved.  Isometric Hold With Pelvic Floor (Hook-Lying)    Copyright  VHI. All rights reserved.  Heel Slide to Straight   Slide one leg down to straight. Return. Be sure pelvis does not rock forward, tilt, rotate, or tip to side. Do _10__ times. Restabilize pelvis. Repeat with other leg. Do __1-2_ sets, __2_ times per day.  http://ss.exer.us/16   Copyright  VHI. All rights reserved.  Over Head Pull: Narrow and Wide Grip   Cancer Rehab [phone]   On back, knees bent, feet flat, band across thighs, elbows straight but relaxed. Pull hands apart (start). Keeping elbows straight, bring arms up and over head, hands toward floor. Keep pull steady on band. Hold momentarily. Return slowly, keeping pull steady, back to start. Then do same with a wider grip on the band (past shoulder width) Repeat _5-10__ times. Band color __yellow____   Side  Pull: Double Arm   On back, knees bent, feet flat. Arms perpendicular to body, shoulder level, elbows straight but relaxed. Pull arms out to sides, elbows straight. Resistance band comes across collarbones, hands toward floor. Hold momentarily. Slowly return to starting position. Repeat _5-10__ times. Band color _yellow____   Sword   On back, knees bent, feet flat, left hand on left hip, right hand above left. Pull right arm DIAGONALLY (hip to shoulder) across chest. Bring right arm along head toward floor. Hold momentarily. Slowly return to starting position. Repeat _5-10__ times. Do with left arm. Band color _yellow_____   Shoulder Rotation: Double Arm   On back, knees bent, feet flat, elbows tucked at sides, bent 90, hands palms up. Pull hands apart and down toward floor, keeping elbows near sides. Hold momentarily. Slowly return to starting position. Repeat _5-10__ times. Band color __yellow____   

## 2017-05-06 ENCOUNTER — Encounter: Payer: Self-pay | Admitting: Radiation Oncology

## 2017-05-06 ENCOUNTER — Ambulatory Visit: Payer: Medicare Other | Admitting: Nutrition

## 2017-05-06 ENCOUNTER — Other Ambulatory Visit: Payer: Medicare Other

## 2017-05-06 ENCOUNTER — Telehealth: Payer: Self-pay | Admitting: Hematology

## 2017-05-06 ENCOUNTER — Encounter: Payer: Self-pay | Admitting: *Deleted

## 2017-05-06 ENCOUNTER — Ambulatory Visit: Payer: Medicare Other

## 2017-05-06 ENCOUNTER — Ambulatory Visit
Admission: RE | Admit: 2017-05-06 | Discharge: 2017-05-06 | Disposition: A | Discharge status: Home / Self Care | Payer: Medicare Other | Source: Ambulatory Visit | Attending: Radiation Oncology | Admitting: Radiation Oncology

## 2017-05-06 ENCOUNTER — Other Ambulatory Visit: Payer: Self-pay

## 2017-05-06 DIAGNOSIS — C029 Malignant neoplasm of tongue, unspecified: Secondary | ICD-10-CM

## 2017-05-06 DIAGNOSIS — Z510 Encounter for antineoplastic radiation therapy: Secondary | ICD-10-CM | POA: Diagnosis not present

## 2017-05-06 DIAGNOSIS — Z483 Aftercare following surgery for neoplasm: Secondary | ICD-10-CM | POA: Diagnosis not present

## 2017-05-06 DIAGNOSIS — C021 Malignant neoplasm of border of tongue: Secondary | ICD-10-CM

## 2017-05-06 DIAGNOSIS — R1312 Dysphagia, oropharyngeal phase: Secondary | ICD-10-CM

## 2017-05-06 LAB — CBC WITH DIFFERENTIAL/PLATELET
BASO%: 0.8 % (ref 0.0–2.0)
Basophils Absolute: 0.1 10*3/uL (ref 0.0–0.1)
EOS%: 0.7 % (ref 0.0–7.0)
Eosinophils Absolute: 0.1 10*3/uL (ref 0.0–0.5)
HCT: 40.9 % (ref 34.8–46.6)
HGB: 14 g/dL (ref 11.6–15.9)
LYMPH%: 34.4 % (ref 14.0–49.7)
MCH: 32.6 pg (ref 25.1–34.0)
MCHC: 34.2 g/dL (ref 31.5–36.0)
MCV: 95.4 fL (ref 79.5–101.0)
MONO#: 0.5 10*3/uL (ref 0.1–0.9)
MONO%: 6.5 % (ref 0.0–14.0)
NEUT%: 57.6 % (ref 38.4–76.8)
NEUTROS ABS: 4.3 10*3/uL (ref 1.5–6.5)
Platelets: 263 10*3/uL (ref 145–400)
RBC: 4.29 10*6/uL (ref 3.70–5.45)
RDW: 12.6 % (ref 11.2–14.5)
WBC: 7.4 10*3/uL (ref 3.9–10.3)
lymph#: 2.6 10*3/uL (ref 0.9–3.3)

## 2017-05-06 LAB — COMPREHENSIVE METABOLIC PANEL
ALT: 19 U/L (ref 0–55)
AST: 16 U/L (ref 5–34)
Albumin: 4.6 g/dL (ref 3.5–5.0)
Alkaline Phosphatase: 78 U/L (ref 40–150)
Anion Gap: 11 mEq/L (ref 3–11)
BILIRUBIN TOTAL: 0.35 mg/dL (ref 0.20–1.20)
BUN: 11.4 mg/dL (ref 7.0–26.0)
CHLORIDE: 100 meq/L (ref 98–109)
CO2: 28 meq/L (ref 22–29)
CREATININE: 0.8 mg/dL (ref 0.6–1.1)
Calcium: 9.9 mg/dL (ref 8.4–10.4)
EGFR: >60 mL/min/{1.73_m2} (ref >60)
GLUCOSE: 104 mg/dL (ref 70–140)
Potassium: 4.1 mEq/L (ref 3.5–5.1)
SODIUM: 139 meq/L (ref 136–145)
TOTAL PROTEIN: 7.9 g/dL (ref 6.4–8.3)

## 2017-05-06 MED ORDER — SONAFINE EX EMUL
1.0000 "application " | Freq: Once | CUTANEOUS | Status: AC
  Administered 2017-05-06: 1 via TOPICAL

## 2017-05-06 NOTE — Progress Notes (Signed)
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 Isidore Moos.  Past medical history includes bipolar disease, manic depressive disorder, hypothyroidism, hypertension, trach, GERD, and cervical cancer. See MD note for extensive history.  Medications include Synthroid, Ativan, Paxil.  Labs were reviewed.  Height: 65 inches. Weight: 175 pounds, December 4. Usual body weight: 165 pounds. BMI: 29.12.  Patient is starting radiation therapy today.  She is status post chemotherapy at [facility].  She reports she can tolerate soft foods without difficulty. Pleased that she does not need a feeding tube at this time. Reports weight has been basically stable. She is familiar with oral nutrition supplements and prefers Ensure Plus.  Nutrition diagnosis:  Predicted suboptimal energy intake related to floor of mouth and tongue cancer as evidenced by a condition for which research shows an increased incidence of suboptimal energy intake.  Intervention: Recommended she consume small frequent meals and snacks with high-calorie, high-protein foods to promote weight maintenance. Provided education on potential nutrition impact symptoms of radiation therapy. Provided fact sheets and coupons. Questions were answered.  Teach back method used.  Contact information given.  Monitoring, evaluation, goals: Patient will tolerate adequate calories and protein to continue weight maintenance.  Next visit: To be scheduled with treatments weekly.  **Disclaimer: This note was dictated with voice recognition software. Similar sounding words can inadvertently be transcribed and this note may contain transcription errors which may not have been corrected upon publication of note.**

## 2017-05-06 NOTE — Patient Instructions (Signed)
SWALLOWING EXERCISES   1. Effortful Swallows - Press your tongue against the roof of your mouth for 3 seconds, then squeeze the muscles in your neck while you swallow your saliva or a sip of water - Repeat 20 times, 2-3 times a day, and use whenever you eat or drink  2. Masako Swallow - swallow with your tongue sticking out - Stick tongue out past your teeth and gently bite tongue with your teeth - Swallow, while holding your tongue with your teeth - Repeat 20 times, 2-3 times a day *use a wet spoon if your mouth gets dry*  3. Shaker Exercise - head lift - Lie flat on your back in your bed or on a couch without pillows - Raise your head and look at your feet - KEEP YOUR SHOULDERS DOWN - HOLD FOR 45-60 SECONDS, then lower your head back down - Repeat 3 times, 2-3 times a day  4. Mendelsohn Maneuver - "half swallow" exercise - Start to swallow, and keep your Adam's apple up by squeezing hard with the            muscles of the throat - Hold the squeeze for 5-7 seconds and then relax - Repeat 20 times, 2-3 times a day *use a wet spoon if your mouth gets dry*  5. Tongue Stretch/Teeth Clean - Move your tongue around the pocket between your gums and teeth, clockwise and then counter-clockwise - Repeat on the back side, clockwise and then counter-clockwise - Repeat 15-20 times, 2-3 times a day  6. Breath Hold - Say "HUH!" loudly, then hold your breath for 3 seconds at your voice box - Repeat 20 times, 2-3 times a day  7. Chin pushback - Open your mouth  - Place your fist UNDER your chin near your neck, and push back with your fist for 5 seconds - Repeat 10 times, 2-3 times a day

## 2017-05-06 NOTE — Progress Notes (Signed)
Financial Counselor--Spoke with patient and her mom today--they are going to bring income verification tomorrow to see if they can qualify for [place]

## 2017-05-06 NOTE — Telephone Encounter (Signed)
New patient referral from radonc to [place]. Per Addison he can see patient first week in January. Left message for patient re 1/3 appointment and mailed letter.

## 2017-05-06 NOTE — Progress Notes (Signed)
[place] Clinical Social Work  Hol[place] representative received referral from Pension scheme manager for psychosocial support and assessment of needs.  CSW, patient, and family member met in [place] office at [facility].  Patient stated she was doing well and felt positive about her treatment plan and treatment team.  Patient informed CSW that she has had established mental health care and medication management through [facility] for the past 12 years, but recently had a change in providers and is interested in identifying a provider in [place].  CSW and patient discussed options for providers in the community.  After processing options patient decided to maintain her medication management with [facility], but would like to explore therapy options in [place].  CSW and patient discussed different options in the community.  CSW provided patient with contact information for [place].  CSW informed patient of the referral process and patient plans to call to schedule an appointment.  CSW provided patient with contact information and encouraged patient and/or family to call with any questions or concerns.   Johnnye Lana, MSW, LCSW, OSW-C Clinical Social Worker [facility] [phone]

## 2017-05-06 NOTE — Therapy (Signed)
[facility] [address] [place], [place], [address] Phone: [phone]   Fax:  [phone]  Speech Language Pathology Evaluation  Patient Details  Name: Nancy [name] MRN: [id number] Date of Birth: 1961/01/21 Referring Provider: Eppie Gibson, MD   Encounter Date: 05/06/2017  End of Session - 05/06/17 1539    Visit Number  1    Number of Visits  3    Date for SLP Re-Evaluation  07/18/17    SLP Start Time  39    SLP Stop Time   1402    SLP Time Calculation (min)  44 min    Activity Tolerance  Patient tolerated treatment well       Past Medical History:  Diagnosis Date  . Bipolar 1 disorder ([place])   . Cervical cancer ([place])   . GERD (gastroesophageal reflux disease)   . History of tracheostomy 03/04/2017   She had trach placed during surgery and reversed before discharge [facility])  . Hypertension   . Hypothyroid   . Manic depressive disorder ([place])   . Tongue cancer [facility])     Past Surgical History:  Procedure Laterality Date  . bone-skin graft     free osteocutaneous flap with microvascular anastomosis: other than iliac crest, metatarsal or great toe.   . cervical lymphadenectomy  03/04/2017   (modified radical neck dissection) [facility]   . EXCISION OF TONGUE LESION Right 03/04/2017   PR excis tongue, mouth, jaw. [facility])  . exploration, carotid  Left 03/04/2017   [facility].   . GLOSSECTOMY  03/04/2017   Glossectomy; composite WO Radical neck dissect [facility])  . LARYNGOSCOPY  11/22/2016   Laryngoscopy, direct, operative with biopsy, [place]  03/04/2017   Reconstruct mandible/ maxil subperiosteal implant, [facility]  . PARTIAL GLOSSECTOMY    . SKIN FULL THICKNESS GRAFT  03/04/2017   [place]    . VAGINAL HYSTERECTOMY      There were no vitals filed for this visit.  Subjective Assessment - 05/06/17 1338    Subjective  Eating softer foods - jaw is misaligned so pt has difficulty with crunchy foods. "(Eating is) trial and error."    Patient is accompained by:  Family member mother    Currently in Pain?  No/denies         SLP Evaluation [facility] - 05/06/17 1322      SLP Visit Information   SLP Received On  05/06/17    Referring Provider  Eppie Gibson, MD    Medical Diagnosis  SCCA floor of mouth, and SCCA lateral tongue      General Information   HPI  April 2018 rt hemiglossectomy with free flap reconstruction was suggested at [facility], pt with 2nd opinion at [facility]- induction chemo completed mid-Sept, wiht rt mandibular and floor of mouth resection with reconstruction, neck dissection, and r lateral tongue resection with free flap, trach on Oct 15th.Pt now at [facility] to receive follow up rad tx.       Oral Motor/Sensory Function   Overall Oral Motor/Sensory Function  Impaired    Labial Symmetry  Abnormal symmetry left    Labial Strength  Reduced Left    Labial Coordination  Reduced    Lingual ROM  Reduced left    Lingual Symmetry  Abnormal symmetry right    Lingual Strength  Reduced lt more than rt    Lingual Sensation  Reduced Right  Lingual Coordination  Reduced    Facial Sensation  Reduced rt     Velum  Within Functional Limits      Motor Speech   Overall Motor Speech  Impaired    Articulation  Impaired    Level of Impairment  Conversation    Intelligibility  Intelligible       Pt currently tolerates soft foods and some rare regular items (tender steaks) with thin liquids, with modifications of chewing and oral transit on lt. SLP appreciates resection of pt's rt lateral tongue.  POs: Pt ate cheese stick and drank H2O using modifications, without overt s/s aspiration. Thyroid elevation appeared adequate, and swallows appeared timely with possible mild extended oral stage. Oral residue noted as minimal. Pt's swallow deemed WFL at this time.   Because data states the risk for dysphagia  during and after radiation treatment is high due to undergoing radiation tx, SLP taught pt about the possibility of reduced/limited ability for PO intake during rad tx. SLP encouraged pt to continue swallowing POs as far into rad tx as possible, even ingesting POs and/or completing HEP shortly after administration of pain meds.   SLP educated pt re: changes to swallowing musculature after rad tx, and why adherence to dysphagia HEP provided today and PO consumption was necessary to inhibit muscular disuse atrophy and to reduce muscle fibrosis following rad tx. Pt demonstrated understanding of these things to SLP.    SLP then developed a HEP for pt and pt was instructed how to perform exercises involving lingual, vocal, and pharyngeal strengthening. SLP performed each exercise and pt return demonstrated each exercise. SLP ensured pt performance was correct prior to moving on to next exercise. Pt was instructed to complete this program 2-3 times a day, 6-7 days/week.                SLP Education - 05/06/17 1539    Education provided  Yes    Education Details  HEP, late effects head/neck radiation on swallow ability    Person(s) Educated  Patient;Parent(s)    Methods  Explanation;Demonstration;Verbal cues;Handout    Comprehension  Verbalized understanding;Need further instruction;Returned demonstration;Verbal cues required       SLP Short Term Goals - 05/06/17 1542      SLP SHORT TERM GOAL #1   Title  pt will complete HEP with rare min A    Time  1    Period  -- vistis    Status  New      SLP SHORT TERM GOAL #2   Title  pt will tell SLP why she is completing HEP     Time  1    Period  -- visit    Status  New      SLP SHORT TERM GOAL #3   Title  pt will follow compensations of lt-sided mastication and lt-sided oral transit with POs    Time  1    Period  -- visti    Status  New      SLP SHORT TERM GOAL #4   Title  pt will tell SLP 3 overt s/s aspiration PNA with modified  independence    Time  1    Period  -- visit    Status  New       SLP Long Term Goals - 05/06/17 1544      SLP LONG TERM GOAL #1   Title  pt will complete HEP with modified independence     Time  2    Period  -- visits    Status  New      SLP LONG TERM GOAL #2   Title  pt will tell how a food journal can expedite return to more normalized diet    Time  2    Period  -- visits    Status  New      SLP LONG TERM GOAL #3   Title  pt will tell SLP 3 overt s/s aspiration PNA over 2 visits    Time  2    Period  -- visits    Status  New       Plan - May 22, 2017 1540    Clinical Impression Statement  Pt with oropharyngeal swallowing essentially WFL with soft foods, with compensations of lt-sided chewing and lt-sided oral transit. However the probability of further swallowing difficulty increases dramatically with the initiation of radiation therapy. Pt will need to be followed by SLP for regular assessment of accurate HEP completion as well as for safety with POs both during and following treatment/s.    Speech Therapy Frequency  -- approx once every four weeks    Duration  -- 3 total visits    Treatment/Interventions  Aspiration precaution training;Pharyngeal strengthening exercises;Diet toleration management by SLP;Trials of upgraded texture/liquids;Internal/external aids;Patient/family education;Compensatory strategies;SLP instruction and feedback;Environmental controls    Potential to Achieve Goals  Good       Patient will benefit from skilled therapeutic intervention in order to improve the following deficits and impairments:   Oropharyngeal dysphagia  G-Codes - 05/22/17 1545    Functional Assessment Tool Used  noms, clinical judgment    Functional Limitations  Swallowing    Swallow Current Status (I3382)  At least 20 percent but less than 40 percent impaired, limited or restricted    Swallow Goal Status (N0539)  At least 1 percent but less than 20 percent impaired, limited or  restricted       Problem List Patient Active Problem List   Diagnosis Date Noted  . 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 Squamous cell carcinoma of tongue ([place]) 04/23/2017    [facility] ,[place], CCC-SLP  May 22, 2017, 3:46 PM  [facility] [address] [place], [place], [address] Phone: [phone]   Fax:  [phone]  Name: LENOX LADOUCEUR MRN: [id number] Date of Birth: Oct 12, 1960

## 2017-05-06 NOTE — Progress Notes (Signed)
Oncology Nurse Navigator Documentation  Met with pt and her stepmother following Nutrition and SW appts.   Escorted them to see [place]. Provided guidance for 11:30 lab, 1:15 SLP, 4:30 New Start RT.  Gayleen Orem, RN, BSN, [place] [facility] Nurse [place] [facility] [place] [phone]

## 2017-05-06 NOTE — Progress Notes (Signed)
Pt here for patient teaching.  Pt given Radiation and You booklet, Managing Acute Radiation Side Effects for Head and Neck Cancer handout, skin care instructions and Sonafine.  Reviewed areas of pertinence such as fatigue, mouth changes, skin changes, throat changes, breast swelling and taste changes . Pt able to give teach back of to pat skin, use unscented/gentle soap and drink plenty of water,apply Sonafine bid and avoid applying anything to skin within 4 hours of treatment. Pt verbalizes understanding of information given and will contact nursing with any questions or concerns.     Http://rtanswers.org/treatmentinformation/whattoexpect/index  Managing Acute Radiation Side Effects for Head and Neck Cancer  Skin irritation:  . Sonafine  Topical Emulsion: First-line topical cream to help soothe skin irritation.  Apply to skin in radiation fields at least 4 hours before radiotherapy, or any time after treatments during the rest of the day.  . Triple Antibiotic Ointment (Neosporin): Apply to areas of skin with moist breakdown to prevent infection.  . 1% hydrocortisone cream: Apply to areas of skin that are itching, up to three times a day.  Arnetha Massy (Silver Sulfadiazine): Used in select cases if large patches of skin develop moist breakdown (let physician or nurse know if you have a "sulfa" drug allergy)  Soreness in mouth or throat: . Baking Soda Rinse: a home remedy to soothe/cleanse mouth and loosen thick saliva.  Mix 1/2 teaspoon salt, 1/2 teaspoon baking soda, 1 pint water (16 oz or two cups).  Swish, gargle and spit as needed to soothe/cleanse mouth. Use as often as you want.  . Sucralfate (Carafate): coats throat to soothe it before meals or any time of day. Crush 1 tablet in 10 mL H20 and swallow up to four times a day.  . 2% viscous Lidocaine (Magic Mouth Wash): Soothes mouth and/or throat by numbing your mucous membranes. Mix 1 part 2% viscous lidocaine (Magic Mouth Wash), 1 part  H20. Swish and/or swallow 55mL of this mixture, 40min before meals and at bedtime, up to four times a day. Alternate with Sucralfate (Carafate).  . Narcotics: Various short acting and long acting narcotics can be prescribed.  Often, medical oncology will prescribe these if you are receiving chemotherapy concurrently. Narcotics may cause constipation. It may be helpful to take a stool softener (Docusate Sodium) or gentle laxative (ie Senna or Polyethylene Glycol) to prevent constipation.  Having food in your stomach before ingesting a narcotic may reduce risk of stomach upset.  Thick Saliva: . Baking Soda Rinse: a home remedy to soothe/cleanse mouth and loosen thick saliva.  Mix 1/2 teaspoon salt, 1/2 teaspoon baking soda, 1 pint water (16 oz or two cups).  Swish, gargle, and spit as needed to soothe/cleanse mouth. Use as often as you want.  . Some patients find Diet Ginger Ale or Papaya Juice to be helpful.  . In extreme cases, your physician may consider prescribing a Scopolamine transdermal patch which dries up your saliva.     Poor taste, or lack of taste:   . There are no well-established medications to combat taste bud changes from radiotherapy.  It often takes weeks to months to regain taste function.  Eating bland foods and drinking nutritional shakes  may help you maintain your weight when food is not enjoyable.  Some patients supplement their oral intake with a feeding tube.  Fatigue and weakness: . There is not a well-established safe and effective medication to combat radiation-induced fatigue.  However, if you are able to perform light exercise (such  as a daily walk, yoga, recumbent stationary bicycling), this may combat fatigue and help you maintain muscle mass during treatment.  . Maintaining hydration and nutrition are also important.  If you have not been referred to a nutritionist and would like a referral, please let your nurse or physician know.  . Try to get at least 8 hours  of sleep each night. You may need a daily nap, but try not to nap so late that it interferes with your nightly sleep schedule.

## 2017-05-07 ENCOUNTER — Ambulatory Visit
Admission: RE | Admit: 2017-05-07 | Discharge: 2017-05-07 | Disposition: A | Discharge status: Home / Self Care | Payer: Medicare Other | Source: Ambulatory Visit | Attending: Radiation Oncology | Admitting: Radiation Oncology

## 2017-05-07 ENCOUNTER — Encounter: Payer: Self-pay | Admitting: Physical Therapy

## 2017-05-07 ENCOUNTER — Ambulatory Visit: Payer: Medicare Other | Admitting: Physical Therapy

## 2017-05-07 DIAGNOSIS — R262 Difficulty in walking, not elsewhere classified: Secondary | ICD-10-CM

## 2017-05-07 DIAGNOSIS — Z483 Aftercare following surgery for neoplasm: Secondary | ICD-10-CM | POA: Diagnosis not present

## 2017-05-07 DIAGNOSIS — M6281 Muscle weakness (generalized): Secondary | ICD-10-CM

## 2017-05-07 DIAGNOSIS — Z510 Encounter for antineoplastic radiation therapy: Secondary | ICD-10-CM | POA: Diagnosis not present

## 2017-05-07 DIAGNOSIS — I890 Lymphedema, not elsewhere classified: Secondary | ICD-10-CM

## 2017-05-07 NOTE — Therapy (Signed)
[place], [place], [address] Phone: [phone]   Fax:  [phone]  Physical Therapy Treatment  Patient Details  Name: Nancy [name] MRN: [id number] Date of Birth: 05/15/1961 Referring Provider: Dr. Colin Benton   Encounter Date: 05/07/2017  PT End of Session - 05/07/17 1209    Visit Number  4    Number of Visits  18    Date for PT Re-Evaluation  06/19/16    PT Start Time  1100    PT Stop Time  1145    PT Time Calculation (min)  45 min    Activity Tolerance  Patient tolerated treatment well    Behavior During Therapy  [facility] for tasks assessed/performed       Past Medical History:  Diagnosis Date  . Bipolar 1 disorder ([place])   . Cervical cancer ([place])   . GERD (gastroesophageal reflux disease)   . History of tracheostomy 03/04/2017   She had trach placed during surgery and reversed before discharge [facility])  . Hypertension   . Hypothyroid   . Manic depressive disorder ([place])   . Tongue cancer [facility])     Past Surgical History:  Procedure Laterality Date  . bone-skin graft     free osteocutaneous flap with microvascular anastomosis: other than iliac crest, metatarsal or great toe.   . cervical lymphadenectomy  03/04/2017   (modified radical neck dissection) [facility].   . EXCISION OF TONGUE LESION Right 03/04/2017   PR excis tongue, mouth, jaw. [facility])  . exploration, carotid  Left 03/04/2017   [facility].   . GLOSSECTOMY  03/04/2017   Glossectomy; composite WO Radical neck dissect [facility])  . LARYNGOSCOPY  11/22/2016   Laryngoscopy, direct, operative with biopsy, [place]  03/04/2017   Reconstruct mandible/ maxil subperiosteal implant, [facility]  . PARTIAL GLOSSECTOMY    . SKIN FULL THICKNESS GRAFT  03/04/2017   [place]    . VAGINAL HYSTERECTOMY      There were no vitals filed for this  visit.  Subjective Assessment - 05/07/17 1105    Subjective  Pt started her radiation yesterday and had a long day .  She is fatigued. She also had her appointment with speech pathologist and will be doing exercises for that as well.     Pertinent History  chemo from july til sept 28. Oct 15 surgery for neck dissection, removal tongue and jaw with reconsturction with artery, skin and fibula from left leg. She has skin graft on left leg and has pain and swelling in leg.  She had physical therapy at home up until last week and is still getting home health RN for wound care  they took 63 lymph nodes and she has had swelling and decreased range of motion of her nedk and right arm     Patient Stated Goals  to get full range of motion of neck and shoulder and get rid of pain in leg and get help with swelling in her neck     Currently in Pain?  No/denies                      [facility] Adult PT Treatment/Exercise - 05/07/17 0001      Manual Therapy   Manual Therapy  Edema management;Manual Lymphatic Drainage (MLD)    Edema Management  made chip pack for neck and instructed pt in how to  use it., showed pt the jovi pak that has been ordered for her    Manual Lymphatic Drainage (MLD)  Performed and instructed pt in same, with handout: in siting,5 diaphragmatic breaths,  short neck, shoulder collectors, both axillae, anteior and posterior neck with extra time spent on firmness below chin, right cheek and around ears      Neck Exercises: Stretches   Other Neck Stretches  flexion, extension, with and without 5 reps of "kiss the sky", lateral flexion to both sides and rotation to each direction, all stretches held for 30 sec x 2               PT Short Term Goals - 04/19/17 1311      PT SHORT TERM GOAL #1   Title  Pt will be independent in self manual lymph draiange and use of compression to manage neck lymphedema     Time  4    Period  Weeks    Status  New      PT SHORT TERM GOAL #2    Title  Pt will be independent in a home exercise program for strength of UE and LE  and neck range of motion     Time  4    Period  Weeks    Status  New      PT SHORT TERM GOAL #3   Title  Pt will increase active range of motin of right atm to 130 indicating increase in UE strength so that she can perfom household chores easier     Time  4    Period  Weeks    Status  New      PT SHORT TERM GOAL #4   Title  Pt will report a decrease in overall pain by 25%    Time  4    Period  Weeks    Status  New      PT SHORT TERM GOAL #5   Title  Pt will increase right grip strength by an average of 10#     Time  4    Period  Weeks    Status  New        PT Long Term Goals - 04/19/17 1314      PT LONG TERM GOAL #1   Title  Pt will decrease quick DASH score to less than 38 indicating a functional improvment in left arm use     Time  8    Period  Weeks    Status  New      PT LONG TERM GOAL #2   Title  Pt will increase 30 second sit to stand by 2 repetitions showing improvment in functional strength    Time  8    Period  Weeks    Status  New      PT LONG TERM GOAL #3   Title  pt will decrease TUG score by 2 seconds indicating an improvement in functional gait and balance.     Time  8    Period  Weeks    Status  New      PT LONG TERM GOAL #4   Title  Pt will report a decrease in pain by 50%     Time  8    Period  Weeks    Status  New            Plan - 05/07/17 1210    Clinical Impression Statement  Pt is starting radiation.  Today, treatment focued on neck stretches, instructed and performed manual lymph draiange and provided chip pack Pt was able to provide return demonstration of self  MLD techniques and stretching.     Rehab Potential  Good    Clinical Impairments Affecting Rehab Potential  extensive surgery     PT Frequency  2x / week    PT Duration  8 weeks    PT Treatment/Interventions  ADLs/Self Care Home Management;Therapeutic activities;Therapeutic  exercise;Taping;Manual techniques;Manual lymph drainage;Compression bandaging;Passive range of motion;Scar mobilization;Patient/family education;Functional mobility training;DME Instruction;Balance training;Neuromuscular re-education;Gait training;Stair training    PT Next Visit Plan  Assess effect of chip pack and if she has questions about self MLD. ,  progress shoulder exercise with supine scapular series  and closed chain shoulder circles and scapular work.  For leg.  progress exercise for leg strength and gait training if needed.  Possibly add bike /nustep. Marland Kitchen     Consulted and Agree with Plan of Care  Patient       Patient will benefit from skilled therapeutic intervention in order to improve the following deficits and impairments:  Abnormal gait, Decreased endurance, Decreased skin integrity, Impaired sensation, Increased edema, Decreased scar mobility, Decreased knowledge of precautions, Decreased activity tolerance, Decreased knowledge of use of DME, Decreased strength, Increased fascial restricitons, Impaired UE functional use, Pain, Difficulty walking, Decreased mobility, Decreased balance, Decreased range of motion, Impaired perceived functional ability, Postural dysfunction  Visit Diagnosis: Aftercare following surgery for neoplasm  Muscle weakness (generalized)  Lymphedema, not elsewhere classified  Difficulty in walking, not elsewhere classified     Problem List Patient Active Problem List   Diagnosis Date Noted  . 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 Squamous cell carcinoma of tongue ([place]) 04/23/2017   Donato Heinz. Owens Shark PT  [place] Levo 05/07/2017, 12:17 PM  [place], [place], [address] Phone: [phone]   Fax:  [phone]  Name: FELISSA BLOUCH MRN: [id number] Date of Birth: October 30, 1960

## 2017-05-07 NOTE — Patient Instructions (Addendum)
    .  Sit in front of a mirror. Do 5 slow deep breaths, breathing in through the nose and out through the mouth, letting your belly "inflate" as you breathe in.  Rest your hands on your abdomen as you do this to give slight pressure there.  1) Place hands on areas just behind collar bones and do 10 stationary circles with stretch in outward directions. 2) Do stationary circles at each armpit about 10 times. 3) Place one hand on the front of the opposite shoulder and do stationary circles with stretch downward toward underarm. 4) Repeat #1 above. 5) Imagine a river running in a line from the earlobe straight down the neck.  Place hands just behind this and do circles with stretch coming forward slightly and down, thinking about fluid flowing down that river.  Do 10 times. 6) Place one hand just in front of the river on one side and do circles with a slight back and then downward stretch, thinking again about putting that fluid in the river.  Do 10-20 times on each side. 7) Place one hand just slightly in front of the spot you just did and do the same thing.  DO THIS VERY GENTLY. 8) Use the webspace between your thumb and index finger to pump downward starting just under the chin and "stair stepping" downward with a stretch, working down to the chest. 9) Repeat #1. 10) Do stationary circles on each side of the face just above the chin, out and down with the stretch 10 times. 11) Do stationary circles on each side of the face on the cheeks with pressure going back and down, 10 times. 12) Do stationary circles on each side of the face between the eyes and ears, again back and downward 10 times. 13) Repeat steps 9,8,7,6,5,1,3 and 2 in that order!  Do not slide on the skin, but STRETCH it with your motions. Only give enough pressure to stretch the skin. DO THIS SLOWLY, PLEASE!  And do once a day.

## 2017-05-08 ENCOUNTER — Ambulatory Visit
Admission: RE | Admit: 2017-05-08 | Discharge: 2017-05-08 | Disposition: A | Discharge status: Home / Self Care | Payer: Medicare Other | Source: Ambulatory Visit | Attending: Radiation Oncology | Admitting: Radiation Oncology

## 2017-05-08 DIAGNOSIS — Z510 Encounter for antineoplastic radiation therapy: Secondary | ICD-10-CM | POA: Diagnosis not present

## 2017-05-09 ENCOUNTER — Ambulatory Visit: Payer: Medicare Other

## 2017-05-09 ENCOUNTER — Ambulatory Visit
Admission: RE | Admit: 2017-05-09 | Discharge: 2017-05-09 | Disposition: A | Discharge status: Home / Self Care | Payer: Medicare Other | Source: Ambulatory Visit | Attending: Radiation Oncology | Admitting: Radiation Oncology

## 2017-05-09 DIAGNOSIS — Z510 Encounter for antineoplastic radiation therapy: Secondary | ICD-10-CM | POA: Diagnosis not present

## 2017-05-09 DIAGNOSIS — Z483 Aftercare following surgery for neoplasm: Secondary | ICD-10-CM

## 2017-05-09 DIAGNOSIS — I890 Lymphedema, not elsewhere classified: Secondary | ICD-10-CM

## 2017-05-09 NOTE — Therapy (Signed)
[place], [place], [address] Phone: [phone]   Fax:  [phone]  Physical Therapy Treatment  Patient Details  Name: Nancy [name] MRN: [id number] Date of Birth: 03-Jun-1960 Referring Provider: Dr. Colin Benton   Encounter Date: 05/09/2017  PT End of Session - 05/09/17 1023    Visit Number  5    Number of Visits  18    Date for PT Re-Evaluation  06/19/16    PT Start Time  0937    PT Stop Time  1023    PT Time Calculation (min)  46 min    Activity Tolerance  Patient tolerated treatment well    Behavior During Therapy  [facility] for tasks assessed/performed       Past Medical History:  Diagnosis Date  . Bipolar 1 disorder ([place])   . Cervical cancer ([place])   . GERD (gastroesophageal reflux disease)   . History of tracheostomy 03/04/2017   She had trach placed during surgery and reversed before discharge [facility])  . Hypertension   . Hypothyroid   . Manic depressive disorder ([place])   . Tongue cancer [facility])     Past Surgical History:  Procedure Laterality Date  . bone-skin graft     free osteocutaneous flap with microvascular anastomosis: other than iliac crest, metatarsal or great toe.   . cervical lymphadenectomy  03/04/2017   (modified radical neck dissection) [facility].   . EXCISION OF TONGUE LESION Right 03/04/2017   PR excis tongue, mouth, jaw. [facility])  . exploration, carotid  Left 03/04/2017   [facility].   . GLOSSECTOMY  03/04/2017   Glossectomy; composite WO Radical neck dissect [facility])  . LARYNGOSCOPY  11/22/2016   Laryngoscopy, direct, operative with biopsy, [place]  03/04/2017   Reconstruct mandible/ maxil subperiosteal implant, [facility]  . PARTIAL GLOSSECTOMY    . SKIN FULL THICKNESS GRAFT  03/04/2017   [place]    . VAGINAL HYSTERECTOMY      There were no vitals filed for this  visit.  Subjective Assessment - 05/09/17 0940    Subjective  I just feel really tired this morning and slow moving. My Rt cheek feels more swollen today and I don't know why other than maybe the weather being rainy today? I don't kow. And my Lt ankle has been doing good but it's not hurting, just kind of stinging. I've only worn the chip pack a little since I was here last but I think it's helped some.     Pertinent History  chemo from july til sept 28. Oct 15 surgery for neck dissection, removal tongue and jaw with reconsturction with artery, skin and fibula from left leg. She has skin graft on left leg and has pain and swelling in leg.  She had physical therapy at home up until last week and is still getting home health RN for wound care  they took 19 lymph nodes and she has had swelling and decreased range of motion of her nedk and right arm     Patient Stated Goals  to get full range of motion of neck and shoulder and get rid of pain in leg and get help with swelling in her neck     Currently in Pain?  No/denies                      [facility] Adult PT Treatment/Exercise -  05/09/17 0001      Manual Therapy   Manual Therapy  Edema management;Manual Lymphatic Drainage (MLD)    Manual Lymphatic Drainage (MLD)  Performed and reviewed pt in same: In Supine: short neck, 5 diaphragmatic breaths, shoulder collectors, both axillae, anteior and posterior neck with extra time spent on firmness below chin, right cheek and around ears               PT Short Term Goals - 04/19/17 1311      PT SHORT TERM GOAL #1   Title  Pt will be independent in self manual lymph draiange and use of compression to manage neck lymphedema     Time  4    Period  Weeks    Status  New      PT SHORT TERM GOAL #2   Title  Pt will be independent in a home exercise program for strength of UE and LE  and neck range of motion     Time  4    Period  Weeks    Status  New      PT SHORT TERM GOAL #3   Title   Pt will increase active range of motin of right atm to 130 indicating increase in UE strength so that she can perfom household chores easier     Time  4    Period  Weeks    Status  New      PT SHORT TERM GOAL #4   Title  Pt will report a decrease in overall pain by 25%    Time  4    Period  Weeks    Status  New      PT SHORT TERM GOAL #5   Title  Pt will increase right grip strength by an average of 10#     Time  4    Period  Weeks    Status  New        PT Long Term Goals - 04/19/17 1314      PT LONG TERM GOAL #1   Title  Pt will decrease quick DASH score to less than 38 indicating a functional improvment in left arm use     Time  8    Period  Weeks    Status  New      PT LONG TERM GOAL #2   Title  Pt will increase 30 second sit to stand by 2 repetitions showing improvment in functional strength    Time  8    Period  Weeks    Status  New      PT LONG TERM GOAL #3   Title  pt will decrease TUG score by 2 seconds indicating an improvement in functional gait and balance.     Time  8    Period  Weeks    Status  New      PT LONG TERM GOAL #4   Title  Pt will report a decrease in pain by 50%     Time  8    Period  Weeks    Status  New            Plan - 05/09/17 1024    Clinical Impression Statement  Pt had increased swelling today which she said was affecting her speech as well, so focused on manual lymph drainage today. Pt had excellent visible improvement of swelling by end of session and reported improved ROM of jaw and felt she could talk  better.  Pt has worn chip pack a few times since here last, plans on wearing this as soon as she gets home today for  awhile to further results.     Rehab Potential  Good    Clinical Impairments Affecting Rehab Potential  extensive surgery     PT Frequency  2x / week    PT Duration  8 weeks    PT Treatment/Interventions  ADLs/Self Care Home Management;Therapeutic activities;Therapeutic exercise;Taping;Manual techniques;Manual  lymph drainage;Compression bandaging;Passive range of motion;Scar mobilization;Patient/family education;Functional mobility training;DME Instruction;Balance training;Neuromuscular re-education;Gait training;Stair training    PT Next Visit Plan  Cont MLD  and progress shoulder exercise with supine scapular series  and closed chain shoulder circles and scapular work.  For leg.  progress exercise for leg strength and gait training if needed.  Possibly add bike /nustep. Marland Kitchen     Consulted and Agree with Plan of Care  Patient       Patient will benefit from skilled therapeutic intervention in order to improve the following deficits and impairments:  Abnormal gait, Decreased endurance, Decreased skin integrity, Impaired sensation, Increased edema, Decreased scar mobility, Decreased knowledge of precautions, Decreased activity tolerance, Decreased knowledge of use of DME, Decreased strength, Increased fascial restricitons, Impaired UE functional use, Pain, Difficulty walking, Decreased mobility, Decreased balance, Decreased range of motion, Impaired perceived functional ability, Postural dysfunction  Visit Diagnosis: Aftercare following surgery for neoplasm  Lymphedema, not elsewhere classified     Problem List Patient Active Problem List   Diagnosis Date Noted  . 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 Squamous cell carcinoma of tongue ([facility]) 04/23/2017    Otelia Limes, PTA 05/09/2017, 10:34 AM  [place], [place], [address] Phone: [phone]   Fax:  [phone]  Name: Nancy [name] MRN: [id number] Date of Birth: [birth year]

## 2017-05-10 ENCOUNTER — Ambulatory Visit
Admission: RE | Admit: 2017-05-10 | Discharge: 2017-05-10 | Disposition: A | Discharge status: Home / Self Care | Payer: Medicare Other | Source: Ambulatory Visit | Attending: Radiation Oncology | Admitting: Radiation Oncology

## 2017-05-10 DIAGNOSIS — Z510 Encounter for antineoplastic radiation therapy: Secondary | ICD-10-CM | POA: Diagnosis not present

## 2017-05-13 ENCOUNTER — Ambulatory Visit
Admission: RE | Admit: 2017-05-13 | Discharge: 2017-05-13 | Disposition: A | Discharge status: Home / Self Care | Payer: Medicare Other | Source: Ambulatory Visit | Attending: Radiation Oncology | Admitting: Radiation Oncology

## 2017-05-13 ENCOUNTER — Encounter: Payer: Self-pay | Admitting: Radiation Oncology

## 2017-05-13 DIAGNOSIS — Z510 Encounter for antineoplastic radiation therapy: Secondary | ICD-10-CM | POA: Diagnosis not present

## 2017-05-13 NOTE — Progress Notes (Signed)
Rec'd from Dr. Isidore Moos completed FMLA documents. Originals given to L3 and copies are scanned into patient's chart.

## 2017-05-15 ENCOUNTER — Encounter: Payer: Self-pay | Admitting: Physical Therapy

## 2017-05-15 ENCOUNTER — Ambulatory Visit
Admission: RE | Admit: 2017-05-15 | Discharge: 2017-05-15 | Disposition: A | Discharge status: Home / Self Care | Payer: Medicare Other | Source: Ambulatory Visit | Attending: Radiation Oncology | Admitting: Radiation Oncology

## 2017-05-15 ENCOUNTER — Ambulatory Visit: Payer: Medicare Other | Admitting: Physical Therapy

## 2017-05-15 ENCOUNTER — Other Ambulatory Visit: Payer: Self-pay | Admitting: Radiation Oncology

## 2017-05-15 DIAGNOSIS — R262 Difficulty in walking, not elsewhere classified: Secondary | ICD-10-CM

## 2017-05-15 DIAGNOSIS — C029 Malignant neoplasm of tongue, unspecified: Secondary | ICD-10-CM

## 2017-05-15 DIAGNOSIS — Z510 Encounter for antineoplastic radiation therapy: Secondary | ICD-10-CM | POA: Diagnosis not present

## 2017-05-15 DIAGNOSIS — M25511 Pain in right shoulder: Secondary | ICD-10-CM

## 2017-05-15 DIAGNOSIS — Z483 Aftercare following surgery for neoplasm: Secondary | ICD-10-CM

## 2017-05-15 DIAGNOSIS — I890 Lymphedema, not elsewhere classified: Secondary | ICD-10-CM

## 2017-05-15 DIAGNOSIS — M6281 Muscle weakness (generalized): Secondary | ICD-10-CM

## 2017-05-15 MED ORDER — GABAPENTIN 300 MG PO CAPS: 300.0000 mg | ORAL_CAPSULE | Freq: Three times a day (TID) | ORAL | 0 refills | Status: DC

## 2017-05-15 MED FILL — GABAPENTIN 300 MG CAPSULE: 300 | 30 days supply | Qty: 90 | Fill #0

## 2017-05-15 NOTE — Patient Instructions (Signed)

## 2017-05-15 NOTE — Therapy (Signed)
[place], [place], [address] Phone: [phone]   Fax:  [phone]  Physical Therapy Treatment  Patient Details  Name: Nancy [name] MRN: [id number] Date of Birth: 05/19/1961 Referring Provider: Dr. Colin Benton   Encounter Date: 05/15/2017  PT End of Session - 05/15/17 1154    Visit Number  7    Number of Visits  18    Date for PT Re-Evaluation  06/19/16    PT Start Time  1110    PT Stop Time  1150    PT Time Calculation (min)  40 min    Activity Tolerance  Patient tolerated treatment well    Behavior During Therapy  [name] for tasks assessed/performed       Past Medical History:  Diagnosis Date  . Bipolar 1 disorder ([place])   . Cervical cancer ([place])   . GERD (gastroesophageal reflux disease)   . History of tracheostomy 03/04/2017   She had trach placed during surgery and reversed before discharge [facility])  . Hypertension   . Hypothyroid   . Manic depressive disorder ([place])   . Tongue cancer [facility])     Past Surgical History:  Procedure Laterality Date  . bone-skin graft     free osteocutaneous flap with microvascular anastomosis: other than iliac crest, metatarsal or great toe.   . cervical lymphadenectomy  03/04/2017   (modified radical neck dissection) [facility].   . EXCISION OF TONGUE LESION Right 03/04/2017   PR excis tongue, mouth, jaw. [facility])  . exploration, carotid  Left 03/04/2017   [facility].   . GLOSSECTOMY  03/04/2017   Glossectomy; composite WO Radical neck dissect [facility])  . LARYNGOSCOPY  11/22/2016   Laryngoscopy, direct, operative with biopsy, [place]  03/04/2017   Reconstruct mandible/ maxil subperiosteal implant, [facility]  . PARTIAL GLOSSECTOMY    . SKIN FULL THICKNESS GRAFT  03/04/2017   [place]    . VAGINAL HYSTERECTOMY      There were no vitals filed for this  visit.  Subjective Assessment - 05/15/17 1115    Subjective  The last 2 nights, pt has had increased pain across both shoulders down into upper arms whenevery she would lie down.  She does not have the pain if she sits up     Pertinent History  chemo from july til sept 28. Oct 15 surgery for neck dissection, removal tongue and jaw with reconsturction with artery, skin and fibula from left leg. She has skin graft on left leg and has pain and swelling in leg.  She had physical therapy at home up until last week and is still getting home health RN for wound care  they took 5 lymph nodes and she has had swelling and decreased range of motion of her nedk and right arm     Patient Stated Goals  to get full range of motion of neck and shoulder and get rid of pain in leg and get help with swelling in her neck     Currently in Pain?  No/denies    Pain Score  7  pt has taken tylenol     Pain Location  Shoulder    Pain Orientation  Right    Pain Descriptors / Indicators  Aching radiates into upper arms     Pain Type  Acute pain    Aggravating Factors   lying down  Pain Relieving Factors  sittin up     Effect of Pain on Daily Activities  can't sleep well                       [facility] Adult PT Treatment/Exercise - 05/15/17 0001      Neck Exercises: Seated   Other Seated Exercise  neck ROM in sitting with pt having painfull "poppoing in right lateral neck      Shoulder Exercises: Supine   Protraction  Strengthening;Right;Left;5 reps;Theraband    Theraband Level (Shoulder Protraction)  Level 1 (Yellow)    External Rotation  Strengthening;Right;Left pt had increased pain with this     Flexion  Strengthening;Both;5 reps;Theraband    Theraband Level (Shoulder Flexion)  Level 1 (Yellow)    Other Supine Exercises  diagonal elevation with yellow theraband with each side       Modalities   Modalities  Moist Heat      Moist Heat Therapy   Number Minutes Moist Heat  10 Minutes    Moist  Heat Location  Cervical      Manual Therapy   Manual Therapy  Soft tissue mobilization;Myofascial release;Manual Traction    Soft tissue mobilization  in sitting with biotone to cervical and upper interscapular muscles.     Myofascial Release  to paracervical muscles     Manual Traction  to cervical area in supine with head up on 2 pillows              PT Education - 05/15/17 1153    Education provided  Yes    Education Details  supine scapular series with yellow therabancd x 5 reps.  had increased pain with external rotation     Person(s) Educated  Patient    Methods  Explanation;Handout    Comprehension  Verbalized understanding;Returned demonstration       PT Short Term Goals - 04/19/17 1311      PT SHORT TERM GOAL #1   Title  Pt will be independent in self manual lymph draiange and use of compression to manage neck lymphedema     Time  4    Period  Weeks    Status  New      PT SHORT TERM GOAL #2   Title  Pt will be independent in a home exercise program for strength of UE and LE  and neck range of motion     Time  4    Period  Weeks    Status  New      PT SHORT TERM GOAL #3   Title  Pt will increase active range of motin of right atm to 130 indicating increase in UE strength so that she can perfom household chores easier     Time  4    Period  Weeks    Status  New      PT SHORT TERM GOAL #4   Title  Pt will report a decrease in overall pain by 25%    Time  4    Period  Weeks    Status  New      PT SHORT TERM GOAL #5   Title  Pt will increase right grip strength by an average of 10#     Time  4    Period  Weeks    Status  New        PT Long Term Goals - 04/19/17 1314      PT LONG  TERM GOAL #1   Title  Pt will decrease quick DASH score to less than 38 indicating a functional improvment in left arm use     Time  8    Period  Weeks    Status  New      PT LONG TERM GOAL #2   Title  Pt will increase 30 second sit to stand by 2 repetitions showing  improvment in functional strength    Time  8    Period  Weeks    Status  New      PT LONG TERM GOAL #3   Title  pt will decrease TUG score by 2 seconds indicating an improvement in functional gait and balance.     Time  8    Period  Weeks    Status  New      PT LONG TERM GOAL #4   Title  Pt will report a decrease in pain by 50%     Time  8    Period  Weeks    Status  New            Plan - 05/15/17 1203    Clinical Impression Statement  Pt with complaints of painful ache in neck and across both shoulders today that she has had previously but had gone away and now is recurrent. She has palpable tightness in neck and upper traps that was tender to palpation.  She received some relief from trearment but  felt mostly tired.  She was hopins she could get some sleep before radiation.     Clinical Impairments Affecting Rehab Potential  extensive surgery     PT Duration  8 weeks    PT Treatment/Interventions  ADLs/Self Care Home Management;Therapeutic activities;Therapeutic exercise;Taping;Manual techniques;Manual lymph drainage;Compression bandaging;Passive range of motion;Scar mobilization;Patient/family education;Functional mobility training;DME Instruction;Balance training;Neuromuscular re-education;Gait training;Stair training    PT Next Visit Plan  Reassess goals for monthly goal update Assess neck pain and results of treatment, If needed continue with soft tissue work andt MLD  if ok, progress shoulder exercise with review of supine scapular series  and closed chain shoulder circles and scapular work.  For leg.  progress exercise for leg strength and gait training if needed.  Possibly add bike /nustep. Marland Kitchen     Consulted and Agree with Plan of Care  Patient       Patient will benefit from skilled therapeutic intervention in order to improve the following deficits and impairments:  Abnormal gait, Decreased endurance, Decreased skin integrity, Impaired sensation, Increased edema, Decreased  scar mobility, Decreased knowledge of precautions, Decreased activity tolerance, Decreased knowledge of use of DME, Decreased strength, Increased fascial restricitons, Impaired UE functional use, Pain, Difficulty walking, Decreased mobility, Decreased balance, Decreased range of motion, Impaired perceived functional ability, Postural dysfunction  Visit Diagnosis: Aftercare following surgery for neoplasm  Lymphedema, not elsewhere classified  Muscle weakness (generalized)  Difficulty in walking, not elsewhere classified  Acute pain of right shoulder     Problem List Patient Active Problem List   Diagnosis Date Noted  . 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 Squamous cell carcinoma of tongue ([place]) 04/23/2017   Donato Heinz. Owens Shark PT  Norwood Levo 05/15/2017, 12:11 PM  [place], [place], [address] Phone: [phone]   Fax:  [phone]  Name: Nancy [name] MRN: [id number] Date of Birth: [birth year]

## 2017-05-16 ENCOUNTER — Ambulatory Visit
Admission: RE | Admit: 2017-05-16 | Discharge: 2017-05-16 | Disposition: A | Discharge status: Home / Self Care | Payer: Medicare Other | Source: Ambulatory Visit | Attending: Radiation Oncology | Admitting: Radiation Oncology

## 2017-05-16 ENCOUNTER — Ambulatory Visit: Payer: Medicare Other | Admitting: Nutrition

## 2017-05-16 DIAGNOSIS — Z510 Encounter for antineoplastic radiation therapy: Secondary | ICD-10-CM | POA: Diagnosis not present

## 2017-05-16 NOTE — Progress Notes (Signed)
Nutrition follow-up completed with patient after radiation therapy for tongue cancer. Weight improved documented as 177.2 pounds, increased from 175 pounds. Patient denies nausea vomiting, constipation, diarrhea. She did wake with a sore tongue this morning.                                 . She is using salt water rinses.      She drinks Ensure Plus, one bottle every other day.  Nutrition diagnosis: Predicted suboptimal energy intake continues.  Intervention: Educated patient to continue strategies for adequate calories and protein intake. Reviewed nutrition fact sheet on sore mouth and encouraged patient to continue saltwater rinses. Provided coupons for Ensure Plus. Provided recipes and fact sheets. Questions answered.  Teach back method used.  Monitoring, evaluation, goals: Patient will tolerate increased calories and protein for weight maintenance.  Next visit: Thursday, January 3 after radiation therapy.                    **Disclaimer: This note was dictated with voice recognition software. Similar sounding words can inadvertently be transcribed and this note may contain transcription errors which may not have been corrected upon publication of note.**

## 2017-05-17 ENCOUNTER — Ambulatory Visit: Payer: Medicare Other | Admitting: Physical Therapy

## 2017-05-17 ENCOUNTER — Encounter: Payer: Self-pay | Admitting: Physical Therapy

## 2017-05-17 ENCOUNTER — Ambulatory Visit
Admission: RE | Admit: 2017-05-17 | Discharge: 2017-05-17 | Disposition: A | Discharge status: Home / Self Care | Payer: Medicare Other | Source: Ambulatory Visit | Attending: Radiation Oncology | Admitting: Radiation Oncology

## 2017-05-17 DIAGNOSIS — I890 Lymphedema, not elsewhere classified: Secondary | ICD-10-CM

## 2017-05-17 DIAGNOSIS — Z483 Aftercare following surgery for neoplasm: Secondary | ICD-10-CM

## 2017-05-17 DIAGNOSIS — Z510 Encounter for antineoplastic radiation therapy: Secondary | ICD-10-CM | POA: Diagnosis not present

## 2017-05-17 DIAGNOSIS — M25511 Pain in right shoulder: Secondary | ICD-10-CM

## 2017-05-17 DIAGNOSIS — M6281 Muscle weakness (generalized): Secondary | ICD-10-CM

## 2017-05-17 NOTE — Therapy (Signed)
[place], [place], [address] Phone: [phone]   Fax:  [phone]  Physical Therapy Treatment  Patient Details  Name: Nancy [name] MRN: [id number] Date of Birth: Dec 23, 1960 Referring Provider: Dr. Colin Benton   Encounter Date: 05/17/2017  PT End of Session - 05/17/17 1253    Visit Number  8    Number of Visits  18    Date for PT Re-Evaluation  06/19/16    PT Start Time  1020    PT Stop Time  1100    PT Time Calculation (min)  40 min    Activity Tolerance  Patient tolerated treatment well    Behavior During Therapy  [facility] for tasks assessed/performed       Past Medical History:  Diagnosis Date  . Bipolar 1 disorder ([place])   . Cervical cancer ([place])   . GERD (gastroesophageal reflux disease)   . History of tracheostomy 03/04/2017   She had trach placed during surgery and reversed before discharge [facility])  . Hypertension   . Hypothyroid   . Manic depressive disorder ([place])   . Tongue cancer [facility])     Past Surgical History:  Procedure Laterality Date  . bone-skin graft     free osteocutaneous flap with microvascular anastomosis: other than iliac crest, metatarsal or great toe.   . cervical lymphadenectomy  03/04/2017   (modified radical neck dissection) [facility].   . EXCISION OF TONGUE LESION Right 03/04/2017   PR excis tongue, mouth, jaw. [facility])  . exploration, carotid  Left 03/04/2017   [facility].   . GLOSSECTOMY  03/04/2017   Glossectomy; composite WO Radical neck dissect [facility])  . LARYNGOSCOPY  11/22/2016   Laryngoscopy, direct, operative with biopsy, [place]  03/04/2017   Reconstruct mandible/ maxil subperiosteal implant, [facility]  . PARTIAL GLOSSECTOMY    . SKIN FULL THICKNESS GRAFT  03/04/2017   [place]    . VAGINAL HYSTERECTOMY      There were no vitals filed for this  visit.  Subjective Assessment - 05/17/17 1027    Subjective  Pt does not feel good today.  She has a sore throat and also has the pain across both but not as bad as the other day  She thinks it might be a little from the weather too. She feels her cheek is a lttle swollen too     Pertinent History  chemo from july til sept 28. Oct 15 surgery for neck dissection, removal tongue and jaw with reconsturction with artery, skin and fibula from left leg. She has skin graft on left leg and has pain and swelling in leg.  She had physical therapy at home up until last week and is still getting home health RN for wound care  they took 24 lymph nodes and she has had swelling and decreased range of motion of her nedk and right arm     Patient Stated Goals  to get full range of motion of neck and shoulder and get rid of pain in leg and get help with swelling in her neck     Currently in Pain?  Yes    Pain Score  2     Pain Location  Shoulder    Pain Orientation  Right;Left    Pain Descriptors / Indicators  Aching    Pain Type  Acute pain    Pain Radiating Towards  just down toward the elbow                       [facility] Adult PT Treatment/Exercise - 05/17/17 0001      Neck Exercises: Seated   Other Seated Exercise  neck ROM in sitting with pt having painfull "poppoing in right lateral neck      Shoulder Exercises: Pulleys   Flexion  3 minutes    ABduction  1 minute      Moist Heat Therapy   Number Minutes Moist Heat  10 Minutes    Moist Heat Location  Cervical      Manual Therapy   Manual Therapy  Soft tissue mobilization;Myofascial release;Manual Traction    Soft tissue mobilization  in sitting with biotone to cervical and upper interscapular muscles.     Myofascial Release  to paracervical muscles  with prolonged pressure to trigger points                PT Short Term Goals - 04/19/17 1311      PT SHORT TERM GOAL #1   Title  Pt will be independent in self manual lymph  draiange and use of compression to manage neck lymphedema     Time  4    Period  Weeks    Status  New      PT SHORT TERM GOAL #2   Title  Pt will be independent in a home exercise program for strength of UE and LE  and neck range of motion     Time  4    Period  Weeks    Status  New      PT SHORT TERM GOAL #3   Title  Pt will increase active range of motin of right atm to 130 indicating increase in UE strength so that she can perfom household chores easier     Time  4    Period  Weeks    Status  New      PT SHORT TERM GOAL #4   Title  Pt will report a decrease in overall pain by 25%    Time  4    Period  Weeks    Status  New      PT SHORT TERM GOAL #5   Title  Pt will increase right grip strength by an average of 10#     Time  4    Period  Weeks    Status  New        PT Long Term Goals - 04/19/17 1314      PT LONG TERM GOAL #1   Title  Pt will decrease quick DASH score to less than 38 indicating a functional improvment in left arm use     Time  8    Period  Weeks    Status  New      PT LONG TERM GOAL #2   Title  Pt will increase 30 second sit to stand by 2 repetitions showing improvment in functional strength    Time  8    Period  Weeks    Status  New      PT LONG TERM GOAL #3   Title  pt will decrease TUG score by 2 seconds indicating an improvement in functional gait and balance.     Time  8    Period  Weeks    Status  New      PT LONG TERM GOAL #  4   Title  Pt will report a decrease in pain by 50%     Time  8    Period  Weeks    Status  New            Plan - 05/17/17 1254    Clinical Impression Statement  Pt continues to have pain in neck with new onset of sore throat that she thinks is from the radiation.  She felt good with the stretching at the pulleys and gets some relief with soft tissue work to tight muscles.     Clinical Impairments Affecting Rehab Potential  extensive surgery     PT Next Visit Plan  Reassess goals for monthly goal update  Assess neck pain and results of treatment, If needed continue with soft tissue work andt MLD  if ok, progress shoulder exercise with review of supine scapular series  and closed chain shoulder circles and scapular work.  For leg.  progress exercise for leg strength and gait training if needed.  Possibly add bike /nustep. .        Patient will benefit from skilled therapeutic intervention in order to improve the following deficits and impairments:  Abnormal gait, Decreased endurance, Decreased skin integrity, Impaired sensation, Increased edema, Decreased scar mobility, Decreased knowledge of precautions, Decreased activity tolerance, Decreased knowledge of use of DME, Decreased strength, Increased fascial restricitons, Impaired UE functional use, Pain, Difficulty walking, Decreased mobility, Decreased balance, Decreased range of motion, Impaired perceived functional ability, Postural dysfunction  Visit Diagnosis: Aftercare following surgery for neoplasm  Lymphedema, not elsewhere classified  Muscle weakness (generalized)  Acute pain of right shoulder     Problem List Patient Active Problem List   Diagnosis Date Noted  . 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 Squamous cell carcinoma of tongue ([place]) 04/23/2017   Donato Heinz. Owens Shark PT  Norwood Levo 05/17/2017, 12:56 PM  [place], [place], [address] Phone: [phone]   Fax:  [phone]  Name: Nancy [name] MRN: [id number] Date of Birth: [birth year]

## 2017-05-20 ENCOUNTER — Encounter: Payer: Self-pay | Admitting: Physical Therapy

## 2017-05-20 ENCOUNTER — Ambulatory Visit
Admission: RE | Admit: 2017-05-20 | Discharge: 2017-05-20 | Disposition: A | Discharge status: Home / Self Care | Payer: Medicare Other | Source: Ambulatory Visit | Attending: Radiation Oncology | Admitting: Radiation Oncology

## 2017-05-20 ENCOUNTER — Ambulatory Visit: Payer: Medicare Other | Admitting: Physical Therapy

## 2017-05-20 DIAGNOSIS — M25511 Pain in right shoulder: Secondary | ICD-10-CM

## 2017-05-20 DIAGNOSIS — Z510 Encounter for antineoplastic radiation therapy: Secondary | ICD-10-CM | POA: Diagnosis not present

## 2017-05-20 DIAGNOSIS — Z483 Aftercare following surgery for neoplasm: Secondary | ICD-10-CM | POA: Diagnosis not present

## 2017-05-20 DIAGNOSIS — I890 Lymphedema, not elsewhere classified: Secondary | ICD-10-CM

## 2017-05-20 NOTE — Therapy (Signed)
[place], [place], [address] Phone: [phone]   Fax:  [phone]  Physical Therapy Treatment  Patient Details  Name: Nancy [name] MRN: [id number] Date of Birth: February 25, 1961 Referring Provider: Dr. Colin Benton   Encounter Date: 05/20/2017  PT End of Session - 05/20/17 0935    Visit Number  9    Date for PT Re-Evaluation  06/19/16    PT Start Time  0845    PT Stop Time  0930    PT Time Calculation (min)  45 min    Activity Tolerance  Patient tolerated treatment well    Behavior During Therapy  [facility] for tasks assessed/performed       Past Medical History:  Diagnosis Date  . Bipolar 1 disorder ([place])   . Cervical cancer ([place])   . GERD (gastroesophageal reflux disease)   . History of tracheostomy 03/04/2017   She had trach placed during surgery and reversed before discharge [facility])  . Hypertension   . Hypothyroid   . Manic depressive disorder ([place])   . Tongue cancer [facility])     Past Surgical History:  Procedure Laterality Date  . bone-skin graft     free osteocutaneous flap with microvascular anastomosis: other than iliac crest, metatarsal or great toe.   . cervical lymphadenectomy  03/04/2017   (modified radical neck dissection) [facility].   . EXCISION OF TONGUE LESION Right 03/04/2017   PR excis tongue, mouth, jaw. [facility])  . exploration, carotid  Left 03/04/2017   [facility].   . GLOSSECTOMY  03/04/2017   Glossectomy; composite WO Radical neck dissect [facility])  . LARYNGOSCOPY  11/22/2016   Laryngoscopy, direct, operative with biopsy, [place]  03/04/2017   Reconstruct mandible/ maxil subperiosteal implant, [facility]  . PARTIAL GLOSSECTOMY    . SKIN FULL THICKNESS GRAFT  03/04/2017   [place]    . VAGINAL HYSTERECTOMY      There were no vitals filed for this visit.  Subjective Assessment -  05/20/17 0852    Subjective  Pt feels better today.  She is having some radiation changes on her tongue but is using lidocaine to help that. She says she felt better after using the pulleys for her shoulders      Pertinent History  chemo from july til sept 28. Oct 15 surgery for neck dissection, removal tongue and jaw with reconsturction with artery, skin and fibula from left leg. She has skin graft on left leg and has pain and swelling in leg.  She had physical therapy at home up until last week and is still getting home health RN for wound care  they took 51 lymph nodes and she has had swelling and decreased range of motion of her nedk and right arm     Patient Stated Goals  to get full range of motion of neck and shoulder and get rid of pain in leg and get help with swelling in her neck     Currently in Pain?  No/denies                      [facility] Adult PT Treatment/Exercise - 05/20/17 0001      Neck Exercises: Seated   Other Seated Exercise  neck ROM in sitting with pt having painfull "poppoing in right lateral neck manual stretch to keep right shoulder back  Shoulder Exercises: Standing   Protraction  Strengthening;Both;10 reps    Protraction Limitations  into yellow ball on wall with cues for scapular protraciton     Extension  Strengthening;Right;Left;20 reps;Theraband    Theraband Level (Shoulder Extension)  Level 1 (Yellow)    Other Standing Exercises  both hands in yellow loop with elbow retractions     Other Standing Exercises  with UE ranger for scapular prortraction and retraction       Shoulder Exercises: Pulleys   Flexion  3 minutes    ABduction  3 minutes      Manual Therapy   Manual Therapy  Passive ROM    Manual Lymphatic Drainage (MLD)  in supine,  stationary circles with chest and shoulder circles and to anterior throat and neck. extra time spent on cheeks.     Manual Traction  to right shoulder with pec minor stretches                PT  Short Term Goals - 04/19/17 1311      PT SHORT TERM GOAL #1   Title  Pt will be independent in self manual lymph draiange and use of compression to manage neck lymphedema     Time  4    Period  Weeks    Status  New      PT SHORT TERM GOAL #2   Title  Pt will be independent in a home exercise program for strength of UE and LE  and neck range of motion     Time  4    Period  Weeks    Status  New      PT SHORT TERM GOAL #3   Title  Pt will increase active range of motin of right atm to 130 indicating increase in UE strength so that she can perfom household chores easier     Time  4    Period  Weeks    Status  New      PT SHORT TERM GOAL #4   Title  Pt will report a decrease in overall pain by 25%    Time  4    Period  Weeks    Status  New      PT SHORT TERM GOAL #5   Title  Pt will increase right grip strength by an average of 10#     Time  4    Period  Weeks    Status  New        PT Long Term Goals - 04/19/17 1314      PT LONG TERM GOAL #1   Title  Pt will decrease quick DASH score to less than 38 indicating a functional improvment in left arm use     Time  8    Period  Weeks    Status  New      PT LONG TERM GOAL #2   Title  Pt will increase 30 second sit to stand by 2 repetitions showing improvment in functional strength    Time  8    Period  Weeks    Status  New      PT LONG TERM GOAL #3   Title  pt will decrease TUG score by 2 seconds indicating an improvement in functional gait and balance.     Time  8    Period  Weeks    Status  New      PT LONG TERM GOAL #4   Title  Pt will report a decrease in pain by 50%     Time  8    Period  Weeks    Status  New            Plan - 05/20/17 0935    Clinical Impression Statement  Pt is feeling much better today.  She was able to do shoulder exercises to facilitate scapular muscles.  She continues to have tightness in right neck.  She received symptomatic relief from MLD to face and neck    Rehab Potential   Good    Clinical Impairments Affecting Rehab Potential  extensive surgery     PT Frequency  2x / week    PT Duration  8 weeks    PT Next Visit Plan  Reassess goals for monthly goal update Assess neck pain and results of treatment, If needed continue with soft tissue work andt MLD  if ok, progress shoulder exercise with review of supine scapular series  and closed chain shoulder circles and scapular work.  For leg.  progress exercise for leg strength and gait training if needed.  Possibly add bike /nustep. .        Patient will benefit from skilled therapeutic intervention in order to improve the following deficits and impairments:  Abnormal gait, Decreased endurance, Decreased skin integrity, Impaired sensation, Increased edema, Decreased scar mobility, Decreased knowledge of precautions, Decreased activity tolerance, Decreased knowledge of use of DME, Decreased strength, Increased fascial restricitons, Impaired UE functional use, Pain, Difficulty walking, Decreased mobility, Decreased balance, Decreased range of motion, Impaired perceived functional ability, Postural dysfunction  Visit Diagnosis: Aftercare following surgery for neoplasm  Lymphedema, not elsewhere classified  Acute pain of right shoulder     Problem List Patient Active Problem List   Diagnosis Date Noted  . 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 Squamous cell carcinoma of tongue ([place]) 04/23/2017   Donato Heinz. Owens Shark PT  Nancy [name] 05/20/2017, 9:38 AM  [place], [place], [address] Phone: [phone]   Fax:  [phone]  Name: Nancy [name] MRN: [id number] Date of Birth: 1960-06-05

## 2017-05-22 ENCOUNTER — Ambulatory Visit
Admission: RE | Admit: 2017-05-22 | Discharge: 2017-05-22 | Disposition: A | Discharge status: Home / Self Care | Payer: Medicare Other | Source: Ambulatory Visit | Attending: Radiation Oncology | Admitting: Radiation Oncology

## 2017-05-22 ENCOUNTER — Encounter: Payer: Self-pay | Admitting: Physical Therapy

## 2017-05-22 ENCOUNTER — Ambulatory Visit: Payer: Medicare Other | Attending: Otolaryngology | Admitting: Physical Therapy

## 2017-05-22 ENCOUNTER — Encounter: Payer: Self-pay | Admitting: *Deleted

## 2017-05-22 DIAGNOSIS — Z510 Encounter for antineoplastic radiation therapy: Secondary | ICD-10-CM | POA: Diagnosis not present

## 2017-05-22 DIAGNOSIS — I890 Lymphedema, not elsewhere classified: Secondary | ICD-10-CM | POA: Diagnosis present

## 2017-05-22 DIAGNOSIS — R262 Difficulty in walking, not elsewhere classified: Secondary | ICD-10-CM | POA: Diagnosis present

## 2017-05-22 DIAGNOSIS — M25511 Pain in right shoulder: Secondary | ICD-10-CM | POA: Diagnosis present

## 2017-05-22 DIAGNOSIS — R1312 Dysphagia, oropharyngeal phase: Secondary | ICD-10-CM | POA: Diagnosis present

## 2017-05-22 DIAGNOSIS — M6281 Muscle weakness (generalized): Secondary | ICD-10-CM | POA: Insufficient documentation

## 2017-05-22 DIAGNOSIS — Z483 Aftercare following surgery for neoplasm: Secondary | ICD-10-CM | POA: Diagnosis not present

## 2017-05-22 NOTE — Patient Instructions (Signed)
Warrior I Pose    In wide stride stance, feet facing forward, bend right knee and extend left leg behind, heel elevated. Distribute weight equally between front foot and ball of back foot. Extend arms upward beside ears. Hold for ___ breaths. Repeat with other leg forward. Repeat ___ times, alternating legs. Do ___ times per day.  Copyright  VHI. All rights reserved.

## 2017-05-22 NOTE — Progress Notes (Signed)
Oncology Nurse Navigator Documentation  Met with Ms. Pawelski prior to her RT. She indicated she is tolerating tmts with minimal difficulty, denied pronounced SEs. She denied needs/concerns, I encouraged her to contact me should that change.  She voiced agreement.  Gayleen Orem, RN, BSN, [place] [facility] Oncology Nurse [place] [facility] [place] [phone]

## 2017-05-22 NOTE — Therapy (Signed)
[place], [place], [address] Phone: [phone]   Fax:  [phone]  Physical Therapy Treatment  Patient Details  Name: Nancy [name] MRN: [id number] Date of Birth: Nov 16, 1960 Referring Provider: Dr. Colin Benton   Encounter Date: 05/22/2017  PT End of Session - 05/22/17 1246    Visit Number  10    Number of Visits  18    Date for PT Re-Evaluation  06/19/16    PT Start Time  1655    PT Stop Time  1100    PT Time Calculation (min)  45 min    Activity Tolerance  Patient tolerated treatment well    Behavior During Therapy  [facility] for tasks assessed/performed       Past Medical History:  Diagnosis Date  . Bipolar 1 disorder ([place])   . Cervical cancer ([place])   . GERD (gastroesophageal reflux disease)   . History of tracheostomy 03/04/2017   She had trach placed during surgery and reversed before discharge [facility])  . Hypertension   . Hypothyroid   . Manic depressive disorder ([place])   . Tongue cancer [facility])     Past Surgical History:  Procedure Laterality Date  . bone-skin graft     free osteocutaneous flap with microvascular anastomosis: other than iliac crest, metatarsal or great toe.   . cervical lymphadenectomy  03/04/2017   (modified radical neck dissection) [facility].   . EXCISION OF TONGUE LESION Right 03/04/2017   PR excis tongue, mouth, jaw. [facility])  . exploration, carotid  Left 03/04/2017   [facility].   . GLOSSECTOMY  03/04/2017   Glossectomy; composite WO Radical neck dissect [facility])  . LARYNGOSCOPY  11/22/2016   Laryngoscopy, direct, operative with biopsy, [place]  03/04/2017   Reconstruct mandible/ maxil [facility]per[facility]osteal implant, [facility]  . PARTIAL GLOSSECTOMY    . SKIN FULL THICKNESS GRAFT  03/04/2017   [place]    . VAGINAL HYSTERECTOMY      There were no vitals filed for this  visit.  Subjective Assessment - 05/22/17 1019    Subjective  "I'm moving a little slow today"  Pt states she is not having pain, but she has fatigue and soreness in in her tongue and throat.     Pertinent History  chemo from july til sept 28. Oct 15 surgery for neck dissection, removal tongue and jaw with reconsturction with artery, skin and fibula from left leg. She has skin graft on left leg and has pain and swelling in leg.  She had physical therapy at home up until last week and is still getting home health RN for wound care  they took 31 lymph nodes and she has had swelling and decreased range of motion of her nedk and right arm     Patient Stated Goals  to get full range of motion of neck and shoulder and get rid of pain in leg and get help with swelling in her neck                       [facility] Adult PT Treatment/Exercise - 05/22/17 0001      Balance Poses: Yoga   Warrior I  1 rep;15 seconds      Elbow Exercises   Elbow Flexion  Strengthening;Right;Left;20 reps      Neck Exercises: Seated   Other Seated Exercise  neck ROM in sitting  with pt having painfull "poppoing in right lateral neck manual stretch to keep right shoulder back       Lumbar Exercises: Aerobic   Stationary Bike  8 minutes at level 1  RPE  1-2       Knee/Hip Exercises: Standing   Heel Raises  Right;Left;5 reps    Lateral Step Up  Right;Left;5 reps;Step Height: 6"    Forward Step Up  Right;Left;5 reps;Step Height: 6"    Wall Squat  10 reps    Other Standing Knee Exercises  attempted to do backward step ups onto 6" step but pt was unable        Shoulder Exercises: Standing   Protraction  Strengthening;Both;10 reps    Retraction  Strengthening;Right;Left      Shoulder Exercises: Pulleys   Flexion  2 minutes    ABduction  2 minutes      Manual Therapy   Manual therapy comments  in sitting gentle manual work to right neck and anterior chest for lymphatic movement and myofascial release               PT Education - 05/22/17 1248    Education provided  Yes    Education Details  exercise programs at [facility] cancer center and [facility]     [employer]) Educated  Patient    Methods  Explanation;Handout    Comprehension  Verbalized understanding       PT Short Term Goals - 04/19/17 1311      PT SHORT TERM GOAL #1   Title  Pt will be independent in self manual lymph draiange and use of compression to manage neck lymphedema     Time  4    Period  Weeks    Status  New      PT SHORT TERM GOAL #2   Title  Pt will be independent in a home exercise program for strength of UE and LE  and neck range of motion     Time  4    Period  Weeks    Status  New      PT SHORT TERM GOAL #3   Title  Pt will increase active range of motin of right atm to 130 indicating increase in UE strength so that she can perfom household chores easier     Time  4    Period  Weeks    Status  New      PT SHORT TERM GOAL #4   Title  Pt will report a decrease in overall pain by 25%    Time  4    Period  Weeks    Status  New      PT SHORT TERM GOAL #5   Title  Pt will increase right grip strength by an average of 10#     Time  4    Period  Weeks    Status  New        PT Long Term Goals - 04/19/17 1314      PT LONG TERM GOAL #1   Title  Pt will decrease quick DASH score to less than 38 indicating a functional improvment in left arm use     Time  8    Period  Weeks    Status  New      PT LONG TERM GOAL #2   Title  Pt will increase 30 second sit to stand by 2 repetitions showing improvment in functional strength  Time  8    Period  Weeks    Status  New      PT LONG TERM GOAL #3   Title  pt will decrease TUG score by 2 seconds indicating an improvement in functional gait and balance.     Time  8    Period  Weeks    Status  New      PT LONG TERM GOAL #4   Title  Pt will report a decrease in pain by 50%     Time  8    Period  Weeks    Status  New            Plan  - 05/22/17 1247    Clinical Impression Statement  Pt is doing better with less pain today, so focused on strenthening for UE ,LE and general fitness  she did well with yoga stretches and educated her about other exercise programs at the cancer center     PT Frequency  2x / week    PT Duration  8 weeks    PT Treatment/Interventions  ADLs/Self Care Home Management;Therapeutic activities;Therapeutic exercise;Taping;Manual techniques;Manual lymph drainage;Compression bandaging;Passive range of motion;Scar mobilization;Patient/family education;Functional mobility training;DME Instruction;Balance training;Neuromuscular re-education;Gait training;Stair training    PT Next Visit Plan  Reassess goals for monthly goal update Assess neck pain and results of treatment, If needed continue with soft tissue work andt MLD  if ok, progress shoulder exercise with review of supine scapular series  and closed chain shoulder circles and scapular work.  For leg.  progress exercise for leg strength and gait training if needed.  Possibly add bike /nustep. [place]     Consulted and Agree with Plan of Care  Patient       Patient will benefit from skilled therapeutic intervention in order to improve the following deficits and impairments:  Abnormal gait, Decreased endurance, Decreased skin integrity, Impaired sensation, Increased edema, Decreased scar mobility, Decreased knowledge of precautions, Decreased activity tolerance, Decreased knowledge of use of DME, Decreased strength, Increased fascial restricitons, Impaired UE functional use, Pain, Difficulty walking, Decreased mobility, Decreased balance, Decreased range of motion, Impaired perceived functional ability, Postural dysfunction  Visit Diagnosis: Aftercare following surgery for neoplasm  Lymphedema, not elsewhere classified  Acute pain of right shoulder  Muscle weakness (generalized)  Difficulty in walking, not elsewhere classified     Problem List Patient Active  Problem List   Diagnosis Date Noted  . 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 Squamous cell carcinoma of tongue ([place]) 04/23/2017   Donato Heinz. Owens Shark PT  Norwood Levo 05/22/2017, 12:52 PM  [place], [place], [address] Phone: [phone]   Fax:  [phone]  Name: Nancy [name] MRN: [id number] Date of Birth: 02/04/1961

## 2017-05-23 ENCOUNTER — Ambulatory Visit
Admission: RE | Admit: 2017-05-23 | Discharge: 2017-05-23 | Disposition: A | Discharge status: Home / Self Care | Payer: Medicare Other | Source: Ambulatory Visit | Attending: Radiation Oncology | Admitting: Radiation Oncology

## 2017-05-23 ENCOUNTER — Ambulatory Visit: Payer: Self-pay | Admitting: Hematology

## 2017-05-23 ENCOUNTER — Ambulatory Visit: Payer: Medicare Other | Admitting: Nutrition

## 2017-05-23 DIAGNOSIS — Z510 Encounter for antineoplastic radiation therapy: Secondary | ICD-10-CM | POA: Diagnosis not present

## 2017-05-23 NOTE — Progress Notes (Signed)
Nutrition follow-up completed with patient after radiation therapy for tongue cancer. Weight improved documented as 180.4 pounds increased from 177.2 pounds. Patient denies nausea, vomiting, constipation, and diarrhea. Her mouth continues to be sore and she is using salt water rinses. She occasionally will drink Ensure Plus.  Nutrition diagnosis: Predicted suboptimal energy intake continues.  Intervention: Patient educated to continue strategies for increased calories and protein. Encouraged patient to continue salt water rinses for sore mouth. Recommended patient continue swallowing exercises as prescribed by speech pathologist. Recommended increased water intake. At patient request, educated patient about feeding tube placement. Questions were answered.  Teach back method used.  Monitoring, evaluation, goals: Patient will tolerate increased calories and protein for weight maintenance.  Next visit: Monday, January 7 after radiation therapy.  **Disclaimer: This note was dictated with voice recognition software. Similar sounding words can inadvertently be transcribed and this note may contain transcription errors which may not have been corrected upon publication of note.**

## 2017-05-24 ENCOUNTER — Ambulatory Visit
Admission: RE | Admit: 2017-05-24 | Discharge: 2017-05-24 | Disposition: A | Discharge status: Home / Self Care | Payer: Medicare Other | Source: Ambulatory Visit | Attending: Radiation Oncology | Admitting: Radiation Oncology

## 2017-05-24 ENCOUNTER — Ambulatory Visit (HOSPITAL_COMMUNITY): Payer: Self-pay | Admitting: Dentistry

## 2017-05-24 DIAGNOSIS — Z510 Encounter for antineoplastic radiation therapy: Secondary | ICD-10-CM | POA: Diagnosis not present

## 2017-05-27 ENCOUNTER — Encounter (HOSPITAL_COMMUNITY): Payer: Self-pay | Admitting: Dentistry

## 2017-05-27 ENCOUNTER — Ambulatory Visit
Admission: RE | Admit: 2017-05-27 | Discharge: 2017-05-27 | Disposition: A | Discharge status: Home / Self Care | Payer: Medicare Other | Source: Ambulatory Visit | Attending: Radiation Oncology | Admitting: Radiation Oncology

## 2017-05-27 ENCOUNTER — Ambulatory Visit: Payer: Medicare Other | Admitting: Nutrition

## 2017-05-27 ENCOUNTER — Other Ambulatory Visit: Payer: Self-pay | Admitting: Radiation Oncology

## 2017-05-27 ENCOUNTER — Ambulatory Visit (HOSPITAL_COMMUNITY): Payer: Self-pay | Admitting: Dentistry

## 2017-05-27 VITALS — BP 110/81 | HR 86 | Temp 99.0°F | Wt 180.0 lb

## 2017-05-27 DIAGNOSIS — C049 Malignant neoplasm of floor of mouth, unspecified: Secondary | ICD-10-CM

## 2017-05-27 DIAGNOSIS — C029 Malignant neoplasm of tongue, unspecified: Secondary | ICD-10-CM | POA: Diagnosis not present

## 2017-05-27 DIAGNOSIS — K1233 Oral mucositis (ulcerative) due to radiation: Secondary | ICD-10-CM

## 2017-05-27 DIAGNOSIS — R432 Parageusia: Secondary | ICD-10-CM

## 2017-05-27 DIAGNOSIS — R682 Dry mouth, unspecified: Secondary | ICD-10-CM

## 2017-05-27 DIAGNOSIS — R1310 Dysphagia, unspecified: Secondary | ICD-10-CM

## 2017-05-27 DIAGNOSIS — K117 Disturbances of salivary secretion: Secondary | ICD-10-CM

## 2017-05-27 DIAGNOSIS — Z510 Encounter for antineoplastic radiation therapy: Secondary | ICD-10-CM | POA: Diagnosis not present

## 2017-05-27 MED ORDER — GABAPENTIN 300 MG PO CAPS: 600.0000 mg | ORAL_CAPSULE | Freq: Three times a day (TID) | ORAL | 0 refills | Status: DC

## 2017-05-27 MED ORDER — LIDOCAINE VISCOUS 2 % MT SOLN: OROMUCOSAL | 5 refills | Status: DC

## 2017-05-27 MED FILL — LIDOCAINE 2% VISCOUS SOLN: 2 | 5 days supply | Qty: 100 | Fill #0

## 2017-05-27 MED FILL — GABAPENTIN 300 MG CAPSULE: 300 | 30 days supply | Qty: 180 | Fill #0

## 2017-05-27 NOTE — Progress Notes (Signed)
05/27/2017  Patient Name:   Nancy [name] Date of Birth:   1960/07/14 Medical Record Number: [id number]  BP 110/81 (BP Location: Left Arm)   Pulse 86   Temp 99 F (37.2 C) (Oral)   Wt 180 lb (81.6 kg)   BMI 29.95 kg/m   [name] presents for oral examination during radiation therapy. Patient has completed 13/30 radiation treatments. No chemotherapy.  REVIEW OF CHIEF COMPLAINTS:  DRY MOUTH: Yes HARD TO SWALLOW: Yes at times  HURT TO SWALLOW: Yes, at times. TASTE CHANGES: Taste starting to change. SORES IN MOUTH: Right side of tongue. TRISMUS: no problems with trismus. WEIGHT: 180 pounds. Started treatment at 178 pounds.  HOME OH REGIMEN:  BRUSHING: twice a day FLOSSING: Usually once a day RINSING: Biotene rinses starting to burn now using salt water rinses only. FLUORIDE: Using fluoride at bedtime TRISMUS EXERCISES:  Maximum interincisal opening: 30 mm DENTAL EXAM:  Oral Hygiene:(PLAQUE): good oral hygiene LOCATION OF MUCOSITIS: right lateral tongue and tip of tongue. DESCRIPTION OF SALIVA: Decreased saliva. Thick saliva. ANY EXPOSED BONE: none noted OTHER WATCHED AREAS: previous site of reconstructive surgery. DX: Xerostomia, Dysgeusia, Dysphagia, Odynophagia and Mucositis  RECOMMENDATIONS: 1. Brush after meals and at bedtime.  Use fluoride at bedtime. 2. Use trismus exercises as directed. 3. Use Biotene Rinse or salt water soda rinses. 4. Multiple sips of water as needed. 5. Return to clinic in two months for oral exam after radiation therapy. Call if problems before then.  Lenn Cal, DDS

## 2017-05-27 NOTE — Progress Notes (Signed)
Nutrition follow-up completed with patient after radiation therapy for tongue cancer. Patient reports weight is 178 pounds this week which is stable. Patient reports mouth is sore, however that is her only nutrition impact symptom. Patient has not yet begun oral nutrition supplements but states she will do that starting day.  Nutrition diagnosis: Predicted suboptimal energy intake continues.  Intervention: Patient educated on modifying textures of foods secondary to sore mouth. Recommended patient continue salt water rinses. Encouraged increased water intake. Questions were answered.  Teach back method used.  Monitoring, evaluation, goals: Patient will work to consume adequate calories and protein for weight maintenance.  Next visit: Wednesday, January 16.  **Disclaimer: This note was dictated with voice recognition software. Similar sounding words can inadvertently be transcribed and this note may contain transcription errors which may not have been corrected upon publication of note.**

## 2017-05-27 NOTE — Patient Instructions (Addendum)
RECOMMENDATIONS: 1. Brush after meals and at bedtime.  Use fluoride at bedtime. 2. Use trismus exercises as directed. 3. Use Biotene Rinse or salt water rinses. 4. Multiple sips of water as needed. 5. Return to clinic in two months for oral exam after radiation therapy. Call if problems before then.  Lenn Cal, DDS

## 2017-05-28 ENCOUNTER — Ambulatory Visit
Admission: RE | Admit: 2017-05-28 | Discharge: 2017-05-28 | Disposition: A | Discharge status: Home / Self Care | Payer: Medicare Other | Source: Ambulatory Visit | Attending: Radiation Oncology | Admitting: Radiation Oncology

## 2017-05-28 DIAGNOSIS — Z510 Encounter for antineoplastic radiation therapy: Secondary | ICD-10-CM | POA: Diagnosis not present

## 2017-05-29 ENCOUNTER — Ambulatory Visit
Admission: RE | Admit: 2017-05-29 | Discharge: 2017-05-29 | Disposition: A | Discharge status: Home / Self Care | Payer: Medicare Other | Source: Ambulatory Visit | Attending: Radiation Oncology | Admitting: Radiation Oncology

## 2017-05-29 ENCOUNTER — Encounter: Payer: Self-pay | Admitting: Physical Therapy

## 2017-05-29 ENCOUNTER — Ambulatory Visit: Payer: Medicare Other | Admitting: Physical Therapy

## 2017-05-29 DIAGNOSIS — I890 Lymphedema, not elsewhere classified: Secondary | ICD-10-CM

## 2017-05-29 DIAGNOSIS — Z483 Aftercare following surgery for neoplasm: Secondary | ICD-10-CM

## 2017-05-29 DIAGNOSIS — Z510 Encounter for antineoplastic radiation therapy: Secondary | ICD-10-CM | POA: Diagnosis not present

## 2017-05-29 DIAGNOSIS — M25511 Pain in right shoulder: Secondary | ICD-10-CM

## 2017-05-29 DIAGNOSIS — M6281 Muscle weakness (generalized): Secondary | ICD-10-CM

## 2017-05-29 NOTE — Therapy (Addendum)
[place], [place], [address] Phone: [phone]   Fax:  [phone]  Physical Therapy Treatment  Patient Details  Name: Nancy [name] MRN: [id number] Date of Birth: 02-Sep-1960 Referring Provider: Dr. Colin Benton   Encounter Date: 05/29/2017  PT End of Session - 05/29/17 1116    Visit Number  11    Number of Visits  18    Date for PT Re-Evaluation  06/19/16    PT Start Time  1130    PT Stop Time  1110    PT Time Calculation (min)  1420 min    Activity Tolerance  Patient tolerated treatment well       Past Medical History:  Diagnosis Date  . Bipolar 1 disorder ([place])   . Cervical cancer ([place])   . GERD (gastroesophageal reflux disease)   . History of tracheostomy 03/04/2017   She had trach placed during surgery and reversed before discharge [facility])  . Hypertension   . Hypothyroid   . Manic depressive disorder ([place])   . Tongue cancer [facility])     Past Surgical History:  Procedure Laterality Date  . bone-skin graft     free osteocutaneous flap with microvascular anastomosis: other than iliac crest, metatarsal or great toe.   . cervical lymphadenectomy  03/04/2017   (modified radical neck dissection) [facility].   . EXCISION OF TONGUE LESION Right 03/04/2017   PR excis tongue, mouth, jaw. [facility])  . exploration, carotid  Left 03/04/2017   [facility]   . GLOSSECTOMY  03/04/2017   Glossectomy; composite WO Radical neck dissect [facility])  . LARYNGOSCOPY  11/22/2016   Laryngoscopy, direct, operative with biopsy, [place]  03/04/2017   Reconstruct mandible/ maxil subperiosteal implant, [facility]  . PARTIAL GLOSSECTOMY    . SKIN FULL THICKNESS GRAFT  03/04/2017   [place]    . VAGINAL HYSTERECTOMY      There were no vitals filed for this visit.  Subjective Assessment - 05/29/17 1027    Subjective  pt states  her feet feel swollen today and she is having pain  in her shoulders and left arm     Pertinent History  chemo from july til sept 28. Oct 15 surgery for neck dissection, removal tongue and jaw with reconsturction with artery, skin and fibula from left leg. She has skin graft on left leg and has pain and swelling in leg.  She had physical therapy at home up until last week and is still getting home health RN for wound care  they took 10 lymph nodes and she has had swelling and decreased range of motion of her nedk and right arm     Patient Stated Goals  to get full range of motion of neck and shoulder and get rid of pain in leg and get help with swelling in her neck     Currently in Pain?  Yes    Pain Score  2     Pain Location  Shoulder    Pain Orientation  Left    Pain Descriptors / Indicators  Aching    Pain Radiating Towards  toward elbow    Pain Onset  More than a month ago    Pain Frequency  Intermittent    Aggravating Factors   depends on how she moves it     Pain Relieving Factors  gabapentin helps  [facility] PT Assessment - 05/29/17 0001      Sit to Stand   Comments  16      AROM   Right Shoulder ABduction  150 Degrees                  [facility] Adult PT Treatment/Exercise - 05/29/17 0001      Elbow Exercises   Elbow Flexion  Strengthening;Right;20 reps;Bar weights/barbell 2#      Lumbar Exercises: Aerobic   Stationary Bike  11 minutes at level 1       Lumbar Exercises: Supine   Bridge  10 reps    Other Supine Lumbar Exercises  encouragement to bear weight through both heels       Knee/Hip Exercises: Sidelying   Hip ABduction  AROM;Right;Left;10 reps      Shoulder Exercises: Supine   Protraction  Strengthening;Right;20 reps 10 reps with no weight     Flexion  Strengthening;Right;Weights long arm with 2# x 10 reps     Other Supine Exercises  small circles in each direction with 2 #       Shoulder Exercises: Sidelying   External Rotation   AROM;Strengthening;Right;10 reps and isometrics     Other Sidelying Exercises  isometric scapular elevation                PT Short Term Goals - 05/29/17 1029      PT SHORT TERM GOAL #1   Title  Pt will be independent in self manual lymph draiange and use of compression to manage neck lymphedema     Baseline  has not received compression garment and still needs to work on massage     Status  On-going      PT SHORT TERM GOAL #2   Title  Pt will be independent in a home exercise program for strength of UE and LE  and neck range of motion     Status  Achieved      PT SHORT TERM GOAL #3   Title  Pt will increase active range of motin of right atm to 130 indicating increase in UE strength so that she can perfom household chores easier     Baseline  150    Status  Achieved      PT SHORT TERM GOAL #4   Title  Pt will report a decrease in overall pain by 25%    Baseline  pain is increased with radiation treatment     Status  On-going      PT SHORT TERM GOAL #5   Title  Pt will increase right grip strength by an average of 10#     Status  On-going        PT Long Term Goals - 05/29/17 1032      PT LONG TERM GOAL #1   Title  Pt will decrease quick DASH score to less than 38 indicating a functional improvment in left arm use     Time  8    Period  Weeks    Status  On-going      PT LONG TERM GOAL #2   Title  Pt will increase 30 second sit to stand by 2 repetitions showing improvment in functional strength    Baseline  16    Status  Achieved      PT LONG TERM GOAL #3   Title  pt will decrease TUG score by 2 seconds indicating an improvement in functional gait and balance.  Status  On-going      PT LONG TERM GOAL #4   Title  Pt will report a decrease in pain by 50%     Status  On-going            Plan - 05/29/17 1106    Clinical Impression Statement  Pt is doing well, but starting to have some problems from radition.  She still has crepitus in right shoulder  with elevation.  Working on bike to treat fatigue  She has met some goals, but still is having pain     PT Frequency  2x / week    PT Duration  8 weeks    PT Next Visit Plan  continue with strengthening Assess neck pain and results of treatment, If needed continue with soft tissue work andt MLD  if ok, progress shoulder exercise with review of supine scapular series  and closed chain shoulder circles and scapular work.  For leg.  progress exercise for leg strength and gait training if needed.  Possibly add bike /nustep. Marland Kitchen     Consulted and Agree with Plan of Care  Patient       Patient will benefit from skilled therapeutic intervention in order to improve the following deficits and impairments:  Abnormal gait, Decreased endurance, Decreased skin integrity, Impaired sensation, Increased edema, Decreased scar mobility, Decreased knowledge of precautions, Decreased activity tolerance, Decreased knowledge of use of DME, Decreased strength, Increased fascial restricitons, Impaired UE functional use, Pain, Difficulty walking, Decreased mobility, Decreased balance, Decreased range of motion, Impaired perceived functional ability, Postural dysfunction  Visit Diagnosis: Aftercare following surgery for neoplasm  Lymphedema, not elsewhere classified  Acute pain of right shoulder  Muscle weakness (generalized)     Problem List Patient Active Problem List   Diagnosis Date Noted  . 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 Squamous cell carcinoma of tongue ([place]) 04/23/2017   Donato Heinz. Owens Shark PT  Norwood Levo 05/29/2017, 11:17 AM  [place], [place], [address] Phone: [phone]   Fax:  [phone]  Name: Nancy [name] MRN: [id number] Date of Birth: 1960-07-14

## 2017-05-30 ENCOUNTER — Ambulatory Visit
Admission: RE | Admit: 2017-05-30 | Discharge: 2017-05-30 | Disposition: A | Discharge status: Home / Self Care | Payer: Medicare Other | Source: Ambulatory Visit | Attending: Radiation Oncology | Admitting: Radiation Oncology

## 2017-05-30 DIAGNOSIS — Z510 Encounter for antineoplastic radiation therapy: Secondary | ICD-10-CM | POA: Diagnosis not present

## 2017-05-31 ENCOUNTER — Encounter: Payer: Self-pay | Admitting: Physical Therapy

## 2017-05-31 ENCOUNTER — Ambulatory Visit
Admission: RE | Admit: 2017-05-31 | Discharge: 2017-05-31 | Disposition: A | Discharge status: Home / Self Care | Payer: Medicare Other | Source: Ambulatory Visit | Attending: Radiation Oncology | Admitting: Radiation Oncology

## 2017-05-31 ENCOUNTER — Ambulatory Visit: Payer: Medicare Other | Admitting: Physical Therapy

## 2017-05-31 DIAGNOSIS — M25511 Pain in right shoulder: Secondary | ICD-10-CM

## 2017-05-31 DIAGNOSIS — M6281 Muscle weakness (generalized): Secondary | ICD-10-CM

## 2017-05-31 DIAGNOSIS — I890 Lymphedema, not elsewhere classified: Secondary | ICD-10-CM

## 2017-05-31 DIAGNOSIS — Z483 Aftercare following surgery for neoplasm: Secondary | ICD-10-CM

## 2017-05-31 DIAGNOSIS — Z510 Encounter for antineoplastic radiation therapy: Secondary | ICD-10-CM | POA: Diagnosis not present

## 2017-05-31 NOTE — Therapy (Signed)
[place], [place], [address] Phone: [phone]   Fax:  [phone]  Physical Therapy Treatment  Patient Details  Name: Nancy [name] MRN: [id number] Date of Birth: 12-Apr-1961 Referring Provider: Dr. Colin Benton   Encounter Date: 05/31/2017  PT End of Session - 05/31/17 1255    Visit Number  12    Number of Visits  18    Date for PT Re-Evaluation  06/19/16    PT Start Time  8937    PT Stop Time  1100    PT Time Calculation (min)  45 min    Activity Tolerance  Patient tolerated treatment well    Behavior During Therapy  [facility] for tasks assessed/performed       Past Medical History:  Diagnosis Date  . Bipolar 1 disorder ([place])   . Cervical cancer ([place])   . GERD (gastroesophageal reflux disease)   . History of tracheostomy 03/04/2017   She had trach placed during surgery and reversed before discharge [facility])  . Hypertension   . Hypothyroid   . Manic depressive disorder ([place])   . Tongue cancer [facility])     Past Surgical History:  Procedure Laterality Date  . bone-skin graft     free osteocutaneous flap with microvascular anastomosis: other than iliac crest, metatarsal or great toe.   . cervical lymphadenectomy  03/04/2017   (modified radical neck dissection) [facility].   . EXCISION OF TONGUE LESION Right 03/04/2017   PR excis tongue, mouth, jaw. [facility])  . exploration, carotid  Left 03/04/2017   [facility].   . GLOSSECTOMY  03/04/2017   Glossectomy; composite WO Radical neck dissect [place])  . LARYNGOSCOPY  11/22/2016   Laryngoscopy, direct, operative with biopsy, [place]  03/04/2017   Reconstruct mandible/ maxil subperiosteal implant, [facility]  . PARTIAL GLOSSECTOMY    . SKIN FULL THICKNESS GRAFT  03/04/2017   [place]    . VAGINAL HYSTERECTOMY      There were no vitals filed for this  visit.  Subjective Assessment - 05/31/17 1024    Subjective  Pt says she had increased pain in neck and across shoulders Wednesday night and could not lie down to sleep because she has pain across both her shoulders  She is concerned about the  effects from chemo     Pertinent History  chemo from july til sept 28. Oct 15 surgery for neck dissection, removal tongue and jaw with reconsturction with artery, skin and fibula from left leg. She has skin graft on left leg and has pain and swelling in leg.  She had physical therapy at home up until last week and is still getting home health RN for wound care  they took 29 lymph nodes and she has had swelling and decreased range of motion of her nedk and right arm     Patient Stated Goals  to get full range of motion of neck and shoulder and get rid of pain in leg and get help with swelling in her neck     Currently in Pain?  No/denies                      [facility] Adult PT Treatment/Exercise - 05/31/17 0001      Lumbar Exercises: Aerobic   Stationary Bike  10 minutes at level 1  HR 104, O2 sats 97. Pt feels dyspneic today  with this activity       Manual Therapy   Manual Therapy  Soft tissue mobilization;Myofascial release;Manual Lymphatic Drainage (MLD)    Manual therapy comments  in sitting gentle manual work to right neck and anterior chest for lymphatic movement and myofascial release  avoiding radiation area     Soft tissue mobilization  in sitting with biotone to cervical and upper interscapular muscles.     Myofascial Release  to paracervical muscles  with prolonged pressure to trigger points                PT Short Term Goals - 05/29/17 1029      PT SHORT TERM GOAL #1   Title  Pt will be independent in self manual lymph draiange and use of compression to manage neck lymphedema     Baseline  has not received compression garment and still needs to work on massage     Status  On-going      PT SHORT TERM GOAL #2   Title   Pt will be independent in a home exercise program for strength of UE and LE  and neck range of motion     Status  Achieved      PT SHORT TERM GOAL #3   Title  Pt will increase active range of motin of right atm to 130 indicating increase in UE strength so that she can perfom household chores easier     Baseline  150    Status  Achieved      PT SHORT TERM GOAL #4   Title  Pt will report a decrease in overall pain by 25%    Baseline  pain is increased with radiation treatment     Status  On-going      PT SHORT TERM GOAL #5   Title  Pt will increase right grip strength by an average of 10#     Status  On-going        PT Long Term Goals - 05/29/17 1032      PT LONG TERM GOAL #1   Title  Pt will decrease quick DASH score to less than 38 indicating a functional improvment in left arm use     Time  8    Period  Weeks    Status  On-going      PT LONG TERM GOAL #2   Title  Pt will increase 30 second sit to stand by 2 repetitions showing improvment in functional strength    Baseline  16    Status  Achieved      PT LONG TERM GOAL #3   Title  pt will decrease TUG score by 2 seconds indicating an improvement in functional gait and balance.     Status  On-going      PT LONG TERM GOAL #4   Title  Pt will report a decrease in pain by 50%     Status  On-going            Plan - 05/31/17 1256    Clinical Impression Statement  Pt states she felt better at end of treatment.  Discussed option for pt suspending treatment during end of radiation as she will likely need more treatment when it its over, but she wants to keep coming for now.     Clinical Impairments Affecting Rehab Potential  extensive surgery     PT Frequency  2x / week    PT Duration  8 weeks  PT Treatment/Interventions  ADLs/Self Care Home Management;Therapeutic activities;Therapeutic exercise;Taping;Manual techniques;Manual lymph drainage;Compression bandaging;Passive range of motion;Scar mobilization;Patient/family  education;Functional mobility training;DME Instruction;Balance training;Neuromuscular re-education;Gait training;Stair training    PT Next Visit Plan  continue with strengthening Assess neck pain and results of treatment, If needed continue with soft tissue work andt MLD  if ok, progress shoulder exercise with review of supine scapular series  and closed chain shoulder circles and scapular work.  For leg.  progress exercise for leg strength and gait training if needed.  Possibly add bike /nustep. Marland Kitchen     Consulted and Agree with Plan of Care  Patient       Patient will benefit from skilled therapeutic intervention in order to improve the following deficits and impairments:  Abnormal gait, Decreased endurance, Decreased skin integrity, Impaired sensation, Increased edema, Decreased scar mobility, Decreased knowledge of precautions, Decreased activity tolerance, Decreased knowledge of use of DME, Decreased strength, Increased fascial restricitons, Impaired UE functional use, Pain, Difficulty walking, Decreased mobility, Decreased balance, Decreased range of motion, Impaired perceived functional ability, Postural dysfunction  Visit Diagnosis: Aftercare following surgery for neoplasm  Lymphedema, not elsewhere classified  Acute pain of right shoulder  Muscle weakness (generalized)     Problem List Patient Active Problem List   Diagnosis Date Noted  . 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 Squamous cell carcinoma of tongue ([place]) 04/23/2017   Maudry Diego, PT 05/31/17 12:59 PM  Norwood Levo 05/31/2017, 12:59 PM  [place], [place], [address] Phone: [phone]   Fax:  [phone]  Name: Nancy [name] MRN: [id number] Date of Birth: 1960-11-06

## 2017-06-03 ENCOUNTER — Ambulatory Visit
Admission: RE | Admit: 2017-06-03 | Discharge: 2017-06-03 | Disposition: A | Discharge status: Home / Self Care | Payer: Medicare Other | Source: Ambulatory Visit | Attending: Radiation Oncology | Admitting: Radiation Oncology

## 2017-06-03 ENCOUNTER — Other Ambulatory Visit: Payer: Self-pay | Admitting: Radiation Oncology

## 2017-06-03 DIAGNOSIS — Z510 Encounter for antineoplastic radiation therapy: Secondary | ICD-10-CM | POA: Diagnosis not present

## 2017-06-03 DIAGNOSIS — C029 Malignant neoplasm of tongue, unspecified: Secondary | ICD-10-CM

## 2017-06-03 MED ORDER — SUCRALFATE 1 G PO TABS: ORAL_TABLET | ORAL | 5 refills | Status: DC

## 2017-06-03 MED FILL — SUCRALFATE 1 GM TABLET: 1 | 12 days supply | Qty: 48 | Fill #0

## 2017-06-04 ENCOUNTER — Ambulatory Visit: Payer: Medicare Other | Admitting: Physical Therapy

## 2017-06-04 ENCOUNTER — Encounter: Payer: Self-pay | Admitting: Physical Therapy

## 2017-06-04 ENCOUNTER — Emergency Department (HOSPITAL_COMMUNITY)
Admission: EM | Admit: 2017-06-04 | Discharge: 2017-06-04 | Disposition: A | Discharge status: Home / Self Care | Payer: Medicare Other | Attending: Emergency Medicine | Admitting: Emergency Medicine

## 2017-06-04 ENCOUNTER — Emergency Department (HOSPITAL_COMMUNITY): Payer: Medicare Other

## 2017-06-04 ENCOUNTER — Ambulatory Visit
Admission: RE | Admit: 2017-06-04 | Discharge: 2017-06-04 | Disposition: A | Discharge status: Home / Self Care | Payer: Medicare Other | Source: Ambulatory Visit | Attending: Radiation Oncology | Admitting: Radiation Oncology

## 2017-06-04 DIAGNOSIS — R070 Pain in throat: Secondary | ICD-10-CM | POA: Diagnosis not present

## 2017-06-04 DIAGNOSIS — R42 Dizziness and giddiness: Secondary | ICD-10-CM | POA: Diagnosis not present

## 2017-06-04 DIAGNOSIS — Z483 Aftercare following surgery for neoplasm: Secondary | ICD-10-CM | POA: Diagnosis not present

## 2017-06-04 DIAGNOSIS — I951 Orthostatic hypotension: Secondary | ICD-10-CM | POA: Diagnosis not present

## 2017-06-04 DIAGNOSIS — M6281 Muscle weakness (generalized): Secondary | ICD-10-CM

## 2017-06-04 DIAGNOSIS — M25511 Pain in right shoulder: Secondary | ICD-10-CM

## 2017-06-04 DIAGNOSIS — R51 Headache: Secondary | ICD-10-CM | POA: Diagnosis not present

## 2017-06-04 DIAGNOSIS — Z923 Personal history of irradiation: Secondary | ICD-10-CM | POA: Insufficient documentation

## 2017-06-04 DIAGNOSIS — Z79899 Other long term (current) drug therapy: Secondary | ICD-10-CM | POA: Diagnosis not present

## 2017-06-04 DIAGNOSIS — I10 Essential (primary) hypertension: Secondary | ICD-10-CM | POA: Insufficient documentation

## 2017-06-04 DIAGNOSIS — Z85810 Personal history of malignant neoplasm of tongue: Secondary | ICD-10-CM | POA: Diagnosis not present

## 2017-06-04 DIAGNOSIS — Z510 Encounter for antineoplastic radiation therapy: Secondary | ICD-10-CM | POA: Diagnosis not present

## 2017-06-04 DIAGNOSIS — Z8541 Personal history of malignant neoplasm of cervix uteri: Secondary | ICD-10-CM | POA: Insufficient documentation

## 2017-06-04 DIAGNOSIS — R531 Weakness: Secondary | ICD-10-CM | POA: Diagnosis present

## 2017-06-04 DIAGNOSIS — E860 Dehydration: Secondary | ICD-10-CM | POA: Insufficient documentation

## 2017-06-04 DIAGNOSIS — R262 Difficulty in walking, not elsewhere classified: Secondary | ICD-10-CM

## 2017-06-04 DIAGNOSIS — I890 Lymphedema, not elsewhere classified: Secondary | ICD-10-CM

## 2017-06-04 DIAGNOSIS — E039 Hypothyroidism, unspecified: Secondary | ICD-10-CM | POA: Diagnosis not present

## 2017-06-04 LAB — CBC WITH DIFFERENTIAL/PLATELET
BASOS ABS: 0 10*3/uL (ref 0.0–0.1)
BASOS PCT: 0 %
EOS ABS: 0 10*3/uL (ref 0.0–0.7)
Eosinophils Relative: 0 %
HCT: 39.3 % (ref 36.0–46.0)
Hemoglobin: 13.7 g/dL (ref 12.0–15.0)
LYMPHS PCT: 15 %
Lymphs Abs: 1.4 10*3/uL (ref 0.7–4.0)
MCH: 31.6 pg (ref 26.0–34.0)
MCHC: 34.9 g/dL (ref 30.0–36.0)
MCV: 90.6 fL (ref 78.0–100.0)
Monocytes Absolute: 0.5 10*3/uL (ref 0.1–1.0)
Monocytes Relative: 5 %
Neutro Abs: 7.1 10*3/uL (ref 1.7–7.7)
Neutrophils Relative %: 80 %
PLATELETS: 257 10*3/uL (ref 150–400)
RBC: 4.34 MIL/uL (ref 3.87–5.11)
RDW: 12.1 % (ref 11.5–15.5)
WBC: 9.1 10*3/uL (ref 4.0–10.5)

## 2017-06-04 LAB — BASIC METABOLIC PANEL
ANION GAP: 9 (ref 5–15)
BUN: 9 mg/dL (ref 6–20)
CALCIUM: 9.5 mg/dL (ref 8.9–10.3)
CO2: 27 mmol/L (ref 22–32)
Chloride: 100 mmol/L — ABNORMAL LOW (ref 101–111)
Creatinine, Ser: 0.48 mg/dL (ref 0.44–1.00)
Glucose, Bld: 96 mg/dL (ref 65–99)
POTASSIUM: 3.5 mmol/L (ref 3.5–5.1)
SODIUM: 136 mmol/L (ref 135–145)

## 2017-06-04 LAB — URINALYSIS, ROUTINE W REFLEX MICROSCOPIC
BILIRUBIN URINE: NEGATIVE
Glucose, UA: NEGATIVE mg/dL
Hgb urine dipstick: NEGATIVE
KETONES UR: NEGATIVE mg/dL
Leukocytes, UA: NEGATIVE
NITRITE: NEGATIVE
PROTEIN: NEGATIVE mg/dL
Specific Gravity, Urine: 1.006 (ref 1.005–1.030)
pH: 7 (ref 5.0–8.0)

## 2017-06-04 LAB — MAGNESIUM: MAGNESIUM: 1.9 mg/dL (ref 1.7–2.4)

## 2017-06-04 MED ORDER — METOCLOPRAMIDE HCL 5 MG/ML IJ SOLN
10.0000 mg | Freq: Once | INTRAMUSCULAR | Status: AC
  Administered 2017-06-04: 10 mg via INTRAVENOUS
  Filled 2017-06-04: qty 2

## 2017-06-04 MED ORDER — DIPHENHYDRAMINE HCL 50 MG/ML IJ SOLN
25.0000 mg | Freq: Once | INTRAMUSCULAR | Status: AC
  Administered 2017-06-04: 25 mg via INTRAVENOUS
  Filled 2017-06-04: qty 1

## 2017-06-04 MED ORDER — MAGIC MOUTHWASH W/LIDOCAINE: 5.0000 mL | Freq: Four times a day (QID) | ORAL | 0 refills | Status: DC | PRN

## 2017-06-04 MED ORDER — SODIUM CHLORIDE 0.9 % IV BOLUS (SEPSIS)
1000.0000 mL | Freq: Once | INTRAVENOUS | Status: AC
  Administered 2017-06-04: 1000 mL via INTRAVENOUS

## 2017-06-04 NOTE — ED Notes (Signed)
Patient transported to X-ray 

## 2017-06-04 NOTE — Therapy (Signed)
[place], [place], [address] Phone: [phone]   Fax:  [phone]  Physical Therapy Treatment  Patient Details  Name: Nancy [name] MRN: [id number] Date of Birth: 15-Nov-1960 Referring Provider: Dr. Colin Benton   Encounter Date: 06/04/2017  PT End of Session - 06/04/17 1228    Visit Number  13    Number of Visits  18    Date for PT Re-Evaluation  06/19/16    PT Start Time  5929    PT Stop Time  1100    PT Time Calculation (min)  45 min    Activity Tolerance  Patient tolerated treatment well    Behavior During Therapy  [facility] for tasks assessed/performed       Past Medical History:  Diagnosis Date  . Bipolar 1 disorder ([place])   . Cervical cancer ([place])   . GERD (gastroesophageal reflux disease)   . History of tracheostomy 03/04/2017   She had trach placed during surgery and reversed before discharge [facility] [place] [facility])  . Hypertension   . Hypothyroid   . Manic depressive disorder ([place])   . Tongue cancer [facility])     Past Surgical History:  Procedure Laterality Date  . bone-skin graft     free osteocutaneous flap with microvascular anastomosis: other than iliac crest, metatarsal or great toe.   . cervical lymphadenectomy  03/04/2017   (modified radical neck dissection) [facility].   . EXCISION OF TONGUE LESION Right 03/04/2017   PR excis tongue, mouth, jaw. [facility])  . exploration, carotid  Left 03/04/2017   [facility].   . GLOSSECTOMY  03/04/2017   Glossectomy; composite WO Radical neck dissect [facility])  . LARYNGOSCOPY  11/22/2016   Laryngoscopy, direct, operative with biopsy, [place]  03/04/2017   Reconstruct mandible/ [facility]il [facility]per[facility]osteal [facility], [facility]  . PARTIAL GLOSSECTOMY    . SKIN FULL THICKNESS GRAFT  03/04/2017   [place]    . VAGINAL HYSTERECTOMY      There were no vitals filed for this  visit.  Subjective Assessment - 06/04/17 1029    Subjective  Pt does not feel well today.  She has dry throat and points to sternal notch area .  She appears to have more swelling in left cheek  and says she still as sores inside mouth     Pertinent History  chemo from july til sept 28. Oct 15 surgery for neck dissection, removal tongue and jaw with reconsturction with artery, skin and fibula from left leg. She has skin graft on left leg and has pain and swelling in leg.  She had physical therapy at home up until last week and is still getting home health RN for wound care  they took 42 lymph nodes and she has had swelling and decreased range of motion of her nedk and right arm     Patient Stated Goals  to get full range of motion of neck and shoulder and get rid of pain in leg and get help with swelling in her neck     Currently in Pain?  Yes    Pain Score  10-Worst pain ever    Pain Location  Neck    Pain Orientation  Anterior;Mid    Pain Descriptors / Indicators  Sore    Pain Onset  Yesterday  [place] [facility] PT Treatment/Exercise - 06/04/17 0001      Shoulder Exercises: Standing   Other Standing Exercises  for warm up , standing with glute sets and postural extension, weight shift exercises backward shoulder rolls and scapular retraction       Manual Therapy   Manual Lymphatic Drainage (MLD)  in sitting, short neck, diaphragmatic breathing, posterior shoulder circles, axillary and pectorla nodes and stationary circles at lateral and anterior neck directing fluid down and avoiding radiation areas, bilateral cheeks to posterior neck,                 PT Short Term Goals - 05/29/17 1029      PT SHORT TERM GOAL #1   Title  Pt will be independent in self manual lymph draiange and use of compression to manage neck lymphedema     Baseline  has not received compression garment and still needs to work on massage     Status  On-going      PT SHORT TERM  GOAL #2   Title  Pt will be independent in a home exercise program for strength of UE and LE  and neck range of motion     Status  Achieved      PT SHORT TERM GOAL #3   Title  Pt will increase active range of motin of right atm to 130 indicating increase in UE strength so that she can perfom household chores easier     Baseline  150    Status  Achieved      PT SHORT TERM GOAL #4   Title  Pt will report a decrease in overall pain by 25%    Baseline  pain is increased with radiation treatment     Status  On-going      PT SHORT TERM GOAL #5   Title  Pt will increase right grip strength by an average of 10#     Status  On-going        PT Long Term Goals - 05/29/17 1032      PT LONG TERM GOAL #1   Title  Pt will decrease quick DASH score to less than 38 indicating a functional improvment in left arm use     Time  8    Period  Weeks    Status  On-going      PT LONG TERM GOAL #2   Title  Pt will increase 30 second sit to stand by 2 repetitions showing improvment in functional strength    Baseline  16    Status  Achieved      PT LONG TERM GOAL #3   Title  pt will decrease TUG score by 2 seconds indicating an improvement in functional gait and balance.     Status  On-going      PT LONG TERM GOAL #4   Title  Pt will report a decrease in pain by 50%     Status  On-going            Plan - 06/04/17 1228    Clinical Impression Statement  Pt is having a hard time with radiation side effects, but said she felt some improvment at anterior neck after treatment focusing on manual lymph draiange.  Pt would like to continue PT as she always feels better when she leaves here.     PT Next Visit Plan  continue with strengthening Assess neck pain and results of treatment, If needed continue with soft tissue  work andt MLD  if ok, progress shoulder exercise with review of supine scapular series  and closed chain shoulder circles and scapular work.  For leg.  progress exercise for leg strength  and gait training if needed.  Possibly add bike /nustep. .        Patient will benefit from skilled therapeutic intervention in order to improve the following deficits and impairments:  Abnormal gait, Decreased endurance, Decreased skin integrity, Impaired sensation, Increased edema, Decreased scar mobility, Decreased knowledge of precautions, Decreased activity tolerance, Decreased knowledge of use of DME, Decreased strength, Increased fascial restricitons, Impaired UE functional use, Pain, Difficulty walking, Decreased mobility, Decreased balance, Decreased range of motion, Impaired perceived functional ability, Postural dysfunction  Visit Diagnosis: Aftercare following surgery for neoplasm  Lymphedema, not elsewhere classified  Acute pain of right shoulder  Muscle weakness (generalized)  Difficulty in walking, not elsewhere classified     Problem List Patient Active Problem List   Diagnosis Date Noted  . 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 Squamous cell carcinoma of tongue ([place]) 04/23/2017   Donato Heinz. Owens Shark PT  [place]wood Levo 06/04/2017, 12:31 PM  [place], [place], [address] Phone: [phone]   Fax:  [phone]  Name: Nancy [name] MRN: [id number] Date of Birth: [birth year]

## 2017-06-04 NOTE — Discharge Instructions (Signed)
Fortunately, your lab work today was very reassuring. I suspect that some of your weakness and symptoms are due to being dehydrated. Do your best to drink at least 6-8 glasses of water daily. I've also prescribed a mouthwash to help with your pain, to encourage you to drink and eat as much as possible. If you have pain/difficulty swallowing solid food, I'd recommend Boost, Ensure, or other supplement to help get your calories while treating pain.

## 2017-06-04 NOTE — ED Notes (Signed)
Bed: UP10 Expected date:  Expected time:  Means of arrival:  Comments: Cancer center patient

## 2017-06-04 NOTE — ED Triage Notes (Signed)
Coming from Cancer center--currently being treated for tongue cancer, received radiation this morning. EVS staff found patient in the bathroom complaining of weakness/dizziness and a headache. Report received from [place], [place].

## 2017-06-04 NOTE — ED Notes (Signed)
Urine was not collected.

## 2017-06-04 NOTE — Progress Notes (Signed)
I took Ms. Nancy [name] to the ED. Gave report to [employer] Rn.

## 2017-06-04 NOTE — ED Provider Notes (Signed)
Pt signed out by Dr. Ellender Hose pending UA.  The UA is negative.  Pt feels much better and is ready to go.  Pt stable for d/c.   Isla Pence, MD 06/04/17 1724

## 2017-06-04 NOTE — Progress Notes (Signed)
I was called to the waiting room. Ms. Phetteplace was brought from upstairs. She developed dizziness after using the bathroom. She was found laying against the wall by staff members upstairs in medical oncology. They then brought her down to radiation because she had just completed the her radiation treatment today before going upstairs. Ms. Dillenbeck reports feeling dizzy and like she is jerking inside. I do not see visible shaking. I took her vital signs and they were documented in Epic. Dr. Isidore Moos saw her and assessed her. She recommended she be evaluated in the Emergency Room. I called and spoke to [employer] in the ED and she gave me a room number to have Ms. Garr brought to. Baxter Flattery LPN has transported her to the Emergency Room

## 2017-06-04 NOTE — ED Provider Notes (Signed)
DEPT Provider Note   CSN: [id number] Arrival date & time: 06/04/17  1309     History   Chief Complaint Chief Complaint  Patient presents with  . Weakness    HPI Nancy [name] is a 57 y.o. female.  HPI   57 year old female with past medical history of squamous cell carcinoma of the tongue here with generalized weakness.  The patient presents today after receiving therapy at the cancer center.  She is currently receiving treatment on her neck and cheek after lymph node dissection, and receive treatment today.  She states she has been feeling generally unwell for the last 24 hours.  She has had a dry throat as well as sore throat since her most recent radiation therapy.  She endorses lightheadedness upon standing as well as dizziness upon standing.  After treatment, she was found in the bathroom.  She states that she tried to stand up and was very dizzy.  She feels like she is "shaking on the inside" but had no seizure-like activity.  There was no syncope.  She does have a mild headache at this time.  Denies any other specific complaints.  She denies any nausea or vomiting.  She does note she has not been eating and drinking very much due to her sore throat and pain from the radiation treatment.  Denies any fevers.  No chest pain or shortness of breath.  Past Medical History:  Diagnosis Date  . Bipolar 1 disorder ([place])   . Cervical cancer ([place])   . GERD (gastroesophageal reflux disease)   . History of tracheostomy 03/04/2017   She had trach placed during surgery and reversed before discharge [facility])  . Hypertension   . Hypothyroid   . Manic depressive disorder ([place])   . Tongue cancer [facility])     Patient Active Problem List   Diagnosis Date Noted  . 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 Squamous cell carcinoma of tongue ([place]) 04/23/2017    Past  Surgical History:  Procedure Laterality Date  . bone-skin graft     free osteocutaneous flap with microvascular anastomosis: other than iliac crest, metatarsal or great toe.   . cervical lymphadenectomy  03/04/2017   (modified radical neck dissection) [facility].   . EXCISION OF TONGUE LESION Right 03/04/2017   PR excis tongue, mouth, jaw. [facility])  . exploration, carotid  Left 03/04/2017   [facility].   . GLOSSECTOMY  03/04/2017   Glossectomy; composite WO Radical neck dissect [facility])  . LARYNGOSCOPY  11/22/2016   Laryngoscopy, direct, operative with biopsy, [place]  03/04/2017   Reconstruct mandible/ maxil subperiosteal implant, [facility]  . PARTIAL GLOSSECTOMY    . SKIN FULL THICKNESS GRAFT  03/04/2017   [place]    . VAGINAL HYSTERECTOMY      OB History    No data available       Home Medications    Prior to Admission medications   Medication Sig Start Date End Date Taking? Authorizing Provider  acetaminophen (TYLENOL) 500 MG tablet Take 1,000 mg by mouth every 6 (six) hours as needed for mild pain.    Yes [provider]  chlorthalidone (HYGROTON) 25 MG tablet Take 25 mg by mouth daily.  02/19/17  Yes [provider]  DEXILANT 60 MG capsule Take 60 mg by mouth  daily.  04/16/17  Yes [provider]  gabapentin (NEURONTIN) 300 MG capsule Take 2 capsules (600 mg total) by mouth 3 (three) times daily. For pain. Take regularly. 05/27/17  Yes Eppie Gibson, MD  ibuprofen (ADVIL,MOTRIN) 200 MG tablet Take 400 mg by mouth every 6 (six) hours as needed for mild pain.    Yes [provider]  lamoTRIgine (LAMICTAL) 150 MG tablet Take 300 mg by mouth daily.   Yes [provider]  levothyroxine (SYNTHROID, LEVOTHROID) 25 MCG tablet Take 25 mcg by mouth daily before breakfast.  02/19/17  Yes [provider]  lidocaine (XYLOCAINE) 2 % solution Patient: Mix 1part 2%  viscous lidocaine, 1part H20. Swish & swallow 21mL of diluted mixture, 39min before meals and at bedtime, up to QID Patient taking differently: See admin instructions. Patient: Mix 1part 2% viscous lidocaine, 1part H20. Swish & swallow 38mL of diluted mixture, 67min before meals and at bedtime, as needed for mouth pain. 05/27/17  Yes Eppie Gibson, MD  LORazepam (ATIVAN) 0.5 MG tablet TAKE 1 TABLET BY MOUTH EVERY DAY AS NEEDED FOR ANXIETY 04/05/17  Yes [provider]  losartan (COZAAR) 100 MG tablet Take 100 mg by mouth daily.  04/16/17  Yes [provider]  PARoxetine (PAXIL) 40 MG tablet Take 40 mg by mouth daily.   Yes [provider]  sodium fluoride (FLUORISHIELD) 1.1 % GEL dental gel Instill 1 drop of gel per tooth space of fluoride tray.  Place over teeth for 5 minutes.  Remove.  Spit out excess.  Repeat nightly. 04/25/17  Yes Lenn Cal, DDS  magic mouthwash w/lidocaine SOLN Take 5 mLs by mouth 4 (four) times daily as needed for mouth pain. 06/04/17   Duffy Bruce, MD  sucralfate (CARAFATE) 1 g tablet Dissolve 1 tablet in 10 mL H20 and swallow 30 min prior to meals and bedtime. 06/03/17   Eppie Gibson, MD    Family History No family history on file.  Social History Social History   Tobacco Use  . Smoking status: Never Smoker  . Smokeless tobacco: Never Used  Substance Use Topics  . Alcohol use: No  . Drug use: No     Allergies   Amoxicillin and Iodinated diagnostic agents   Review of Systems Review of Systems  Constitutional: Positive for fatigue.  Neurological: Positive for weakness and light-headedness.  All other systems reviewed and are negative.    Physical Exam Updated Vital Signs BP 113/73   Pulse 72   Temp 98.7 F (37.1 C) (Oral)   Resp 19   SpO2 96%   Physical Exam  Constitutional: She is oriented to person, place, and time. She appears well-developed and well-nourished. No distress.  HENT:  Head: Normocephalic and  atraumatic.  Status post partial tongue resection, with healing flap noted in the right oropharynx and right mandible.  There is no overt purulence or erythema.  No fluctuance.  There are chronic skin changes throughout the lower face as well as anterior neck due to radiation, with no warmth or erythema.  No fluctuance.  No significant neck tenderness or masses.  Eyes: Conjunctivae are normal.  Neck: Neck supple.  Cardiovascular: Normal rate, regular rhythm and normal heart sounds. Exam reveals no friction rub.  No murmur heard. Pulmonary/Chest: Effort normal and breath sounds normal. No respiratory distress. She has no wheezes. She has no rales.  Abdominal: She exhibits no distension.  Musculoskeletal: She exhibits no edema.  Neurological: She is alert and oriented to person,  place, and time. She has normal strength. No cranial nerve deficit or sensory deficit. She exhibits normal muscle tone. Gait normal. GCS eye subscore is 4. GCS verbal subscore is 5. GCS motor subscore is 6.  Skin: Skin is warm. Capillary refill takes less than 2 seconds.  Psychiatric: She has a normal mood and affect.  Nursing note and vitals reviewed.    ED Treatments / Results  Labs (all labs ordered are listed, but only abnormal results are displayed) Labs Reviewed  BASIC METABOLIC PANEL - Abnormal; Notable for the following components:      Result Value   Chloride 100 (*)    All other components within normal limits  CBC WITH DIFFERENTIAL/PLATELET  MAGNESIUM  URINALYSIS, ROUTINE W REFLEX MICROSCOPIC    EKG  EKG Interpretation None       Radiology Dg Chest 2 View  Result Date: 06/04/2017 CLINICAL DATA:  Coming from Cancer center--currently being treated for tongue cancer, received radiation this morning. EVS staff found patient in the bathroom complaining of weakness/dizziness and a headache. Pt takes HTN meds. Not diabetic. Nonsomker. No hx of asthma. EXAM: CHEST  2 VIEW COMPARISON:  12/08/2005.   Chest CT, 11/16/2016. FINDINGS: The heart size and mediastinal contours are within normal limits. Both lungs are clear. No pleural effusion or pneumothorax. The visualized skeletal structures are unremarkable. IMPRESSION: No active cardiopulmonary disease. Electronically Signed   By: Lajean Manes M.D.   On: 06/04/2017 15:24   Ct Head Wo Contrast  Result Date: 06/04/2017 CLINICAL DATA:  Coming from Cancer center--currently being treated for tongue cancer, received radiation this morning. EVS staff found patient in the bathroom complaining of weakness/dizziness and a headache. EXAM: CT HEAD WITHOUT CONTRAST TECHNIQUE: Contiguous axial images were obtained from the base of the skull through the vertex without intravenous contrast. COMPARISON:  Head CT dated 12/08/2005 FINDINGS: Brain: Ventricles are normal in size and configuration. All areas of the brain demonstrate normal gray-white matter differentiation. There is no mass, hemorrhage, edema or other evidence of acute parenchymal abnormality. No extra-axial hemorrhage. Vascular: There are chronic calcified atherosclerotic changes of the large vessels at the skull base. No unexpected hyperdense vessel. Skull: Normal. Negative for fracture or focal lesion. Sinuses/Orbits: No acute finding. Other: None. IMPRESSION: Negative head CT.  No intracranial mass, hemorrhage or edema. Electronically Signed   By: Franki Cabot M.D.   On: 06/04/2017 16:23    Procedures Procedures (including critical care time)  Medications Ordered in ED Medications  sodium chloride 0.9 % bolus 1,000 mL (0 mLs Intravenous Stopped 06/04/17 1516)  sodium chloride 0.9 % bolus 1,000 mL (1,000 mLs Intravenous New Bag/Given 06/04/17 1515)  metoCLOPramide (REGLAN) injection 10 mg (10 mg Intravenous Given 06/04/17 1540)  diphenhydrAMINE (BENADRYL) injection 25 mg (25 mg Intravenous Given 06/04/17 1540)     Initial Impression / Assessment and Plan / ED Course  I have reviewed the triage  vital signs and the nursing notes.  Pertinent labs & imaging results that were available during my care of the patient were reviewed by me and considered in my medical decision making (see chart for details).    57 yo F with PMHx as above here with generalized weakness, dizziness on standing, and mild headache. Suspect this is 2/2 dehydration, mild orthostasis in setting of poor PO intake from her tongue CA. Symptoms reproduced with standing though not overtly orthostatic on VS. Fluids, HA meds given. Will check basic lytes, as well as CT Head given known lingual CA, HA  to eval for mets/edema. If CT neg, suspect she can be managed as an outpt.  Patient feeling improved after fluids.  She is awaiting a urinalysis.  CT head is negative.  Suspect that patient can be discharged with encourage p.o. fluids if UA is unremarkable.  If positive, I do suspect she can likely be treated as an outpatient as well.  She is otherwise afebrile, well-appearing, and her headache is improved.  Patient care transferred to Dr. Gilford Raid at the end of my shift. Patient presentation, ED course, and plan of care discussed with review of all pertinent labs and imaging. Please see his/her note for further details regarding further ED course and disposition.   Final Clinical Impressions(s) / ED Diagnoses   Final diagnoses:  Dehydration  Orthostasis    ED Discharge Orders        Ordered    magic mouthwash w/lidocaine SOLN  4 times daily PRN     06/04/17 1552       Duffy Bruce, MD 06/04/17 1637

## 2017-06-05 ENCOUNTER — Ambulatory Visit
Admission: RE | Admit: 2017-06-05 | Discharge: 2017-06-05 | Disposition: A | Discharge status: Home / Self Care | Payer: Medicare Other | Source: Ambulatory Visit | Attending: Radiation Oncology | Admitting: Radiation Oncology

## 2017-06-05 ENCOUNTER — Ambulatory Visit: Payer: Medicare Other | Admitting: Nutrition

## 2017-06-05 DIAGNOSIS — Z510 Encounter for antineoplastic radiation therapy: Secondary | ICD-10-CM | POA: Diagnosis not present

## 2017-06-05 MED FILL — CMPD MMW LID/MAALOX/DIPHEN: 10 days supply | Qty: 200 | Fill #0

## 2017-06-05 NOTE — Progress Notes (Signed)
Nutrition follow-up completed with patient after radiation therapy for tongue cancer. Patient reports her mouth is sore and she is beginning to have difficulty eating and drinking She has not started oral nutrition supplements but states she is planning on starting today.  Nutrition diagnosis:  Predicted suboptimal energy intake has evolved into inadequate oral intake related to tongue cancer as evidenced by patient's self-report of oral intake.  Intervention: Educated patient to continue soft foods as tolerated. Encouraged patient to consume increased fluids. Recommended a minimum of 2 oral nutrition supplements daily. Questions were answered.  Teach back method used.  Monitoring, evaluation, goals: Patient will increase calories and protein as well as fluids to promote healing and weight maintenance.  Next visit: Friday, January 25 after radiation therapy.  **Disclaimer: This note was dictated with voice recognition software. Similar sounding words can inadvertently be transcribed and this note may contain transcription errors which may not have been corrected upon publication of note.**

## 2017-06-06 ENCOUNTER — Ambulatory Visit
Admission: RE | Admit: 2017-06-06 | Discharge: 2017-06-06 | Disposition: A | Discharge status: Home / Self Care | Payer: Medicare Other | Source: Ambulatory Visit | Attending: Radiation Oncology | Admitting: Radiation Oncology

## 2017-06-06 ENCOUNTER — Encounter: Payer: Self-pay | Admitting: Physical Therapy

## 2017-06-06 ENCOUNTER — Ambulatory Visit: Payer: Medicare Other | Admitting: Physical Therapy

## 2017-06-06 ENCOUNTER — Ambulatory Visit: Payer: Medicare Other | Admitting: Psychiatry

## 2017-06-06 DIAGNOSIS — M25511 Pain in right shoulder: Secondary | ICD-10-CM

## 2017-06-06 DIAGNOSIS — M6281 Muscle weakness (generalized): Secondary | ICD-10-CM

## 2017-06-06 DIAGNOSIS — R262 Difficulty in walking, not elsewhere classified: Secondary | ICD-10-CM

## 2017-06-06 DIAGNOSIS — Z483 Aftercare following surgery for neoplasm: Secondary | ICD-10-CM

## 2017-06-06 DIAGNOSIS — Z510 Encounter for antineoplastic radiation therapy: Secondary | ICD-10-CM | POA: Diagnosis not present

## 2017-06-06 DIAGNOSIS — I890 Lymphedema, not elsewhere classified: Secondary | ICD-10-CM
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
## 2017-06-07 ENCOUNTER — Ambulatory Visit
Admission: RE | Admit: 2017-06-07 | Discharge: 2017-06-07 | Disposition: A | Discharge status: Home / Self Care | Payer: Medicare Other | Source: Ambulatory Visit | Attending: Radiation Oncology | Admitting: Radiation Oncology

## 2017-06-07 DIAGNOSIS — Z510 Encounter for antineoplastic radiation therapy: Secondary | ICD-10-CM | POA: Diagnosis not present

## 2017-06-10 ENCOUNTER — Ambulatory Visit
Admission: RE | Admit: 2017-06-10 | Discharge: 2017-06-10 | Disposition: A | Discharge status: Home / Self Care | Payer: Medicare Other | Source: Ambulatory Visit | Attending: Radiation Oncology | Admitting: Radiation Oncology

## 2017-06-10 ENCOUNTER — Ambulatory Visit: Payer: Medicare Other

## 2017-06-10 ENCOUNTER — Other Ambulatory Visit: Payer: Self-pay | Admitting: Radiation Oncology

## 2017-06-10 DIAGNOSIS — C029 Malignant neoplasm of tongue, unspecified: Secondary | ICD-10-CM

## 2017-06-10 DIAGNOSIS — Z510 Encounter for antineoplastic radiation therapy: Secondary | ICD-10-CM | POA: Diagnosis not present

## 2017-06-10 DIAGNOSIS — Z483 Aftercare following surgery for neoplasm: Secondary | ICD-10-CM | POA: Diagnosis not present

## 2017-06-10 DIAGNOSIS — R1312 Dysphagia, oropharyngeal phase: Secondary | ICD-10-CM

## 2017-06-10 MED ORDER — GABAPENTIN 800 MG PO TABS: 800.0000 mg | ORAL_TABLET | Freq: Three times a day (TID) | ORAL | 0 refills | Status: DC

## 2017-06-10 MED FILL — GABAPENTIN 800 MG TABS: 800 | 21 days supply | Qty: 63 | Fill #0

## 2017-06-10 MED FILL — LIDOCAINE 2% VISCOUS SOLN: 2 | 5 days supply | Qty: 100 | Fill #1

## 2017-06-10 NOTE — Therapy (Signed)
[place] [address] [place], [place], [address] Phone: [phone]   Fax:  [phone]  Speech Language Pathology Treatment  Patient Details  Name: Nancy [name] MRN: [id number] Date of Birth: May 23, 1960 Referring Provider: Eppie Gibson, MD   Encounter Date: 06/10/2017  End of Session - 06/10/17 1013    Visit Number  2    Number of Visits  3    Date for SLP Re-Evaluation  07/18/17    SLP Start Time  0927    SLP Stop Time   0959    SLP Time Calculation (min)  32 min    Activity Tolerance  Patient tolerated treatment well       Past Medical History:  Diagnosis Date  . Bipolar 1 disorder ([place])   . Cervical cancer ([place])   . GERD (gastroesophageal reflux disease)   . History of tracheostomy 03/04/2017   She had trach placed during surgery and reversed before discharge [facility])  . Hypertension   . Hypothyroid   . Manic depressive disorder ([place])   . Tongue cancer [facility])     Past Surgical History:  Procedure Laterality Date  . bone-skin graft     free osteocutaneous flap with microvascular anastomosis: other than iliac crest, metatarsal or great toe.   . cervical lymphadenectomy  03/04/2017   (modified radical neck dissection) [facility]   . EXCISION OF TONGUE LESION Right 03/04/2017   PR excis tongue, mouth, jaw. [facility])  . exploration, carotid  Left 03/04/2017   [facility].   . GLOSSECTOMY  03/04/2017   Glossectomy; composite WO Radical neck dissect [facility])  . LARYNGOSCOPY  11/22/2016   Laryngoscopy, direct, operative with biopsy, [place]  03/04/2017   Reconstruct mandible/ maxil subperiosteal implant, [facility]  . PARTIAL GLOSSECTOMY    . SKIN FULL THICKNESS GRAFT  03/04/2017   [place]    . VAGINAL HYSTERECTOMY      There were no vitals filed for this visit.  Subjective Assessment - 06/10/17 0931    Subjective  3 cans Ensure per day, and soft diet, 64 oz water/day.    Currently in Pain?  Yes    Pain Score  1     Pain Location  -- jaw    Pain Orientation  Right    Pain Descriptors / Indicators  Sore    Pain Type  Acute pain    Pain Onset  1 to 4 weeks ago sunday 06-02-17    Pain Frequency  Intermittent    Aggravating Factors   when opening    Pain Relieving Factors  meds            ADULT SLP TREATMENT - 06/10/17 0936      General Information   Behavior/Cognition  Alert;Cooperative;Pleasant mood      Treatment Provided   Treatment provided  Dysphagia      Dysphagia Treatment   Temperature Spikes Noted  No    Respiratory Status  Room air    Oral Cavity - Dentition  Missing dentition    Treatment Methods  Skilled observation;Therapeutic exercise;Patient/caregiver education    Patient observed directly with PO's  Yes    Type of PO's observed  Dysphagia 1 (puree);Thin liquids    Oral Phase Signs & Symptoms  Prolonged bolus formation    Pharyngeal Phase Signs & Symptoms  -- none noted    Other treatment/comments  Pt to meet  with dentist at [facility] on 07-04-17 for consult. Back teeth on lt do not meet. In the last week pt unable to do HEP for swallowing. SLP encouraged pt to do all exercises as she is able, except the fist/jaw resist. Pt's procedure with remaining exercises except jist/jaw resist was WNL. She was encouraged to take some pain meds prior to performing the HEP, PRN.  Pt told SLP  3 overt s/s of aspiration PNA today wiht mod independence.      Assessment / Recommendations / Plan   Plan  Continue with current plan of care      Dysphagia Recommendations   Diet recommendations  Dysphagia 3 (mechanical soft);Dysphagia 2 (fine chop);Thin liquid (diet as tolerated)    Liquids provided via  Cup    Medication Administration  Whole meds with puree as tolerated    Supervision  Patient able to self feed    Postural Changes and/or Swallow Maneuvers  -- keep food positioned on  lt as much as possible      Progression Toward Goals   Progression toward goals  Progressing toward goals       SLP Education - 06/10/17 1012    Education provided  Yes    Education Details  keep up with all exercises PRN, incr completion as pt is able (except jaw/fist resist exercise)    Person(s) Educated  Patient    Methods  Explanation    Comprehension  Verbalized understanding       SLP Short Term Goals - 06/10/17 1014      SLP SHORT TERM GOAL #1   Title  pt will complete HEP with rare min A    Status  Achieved      SLP SHORT TERM GOAL #2   Title  pt will tell SLP why she is completing HEP     Status  Achieved      SLP SHORT TERM GOAL #3   Title  pt will follow compensations of lt-sided mastication and lt-sided oral transit with POs    Status  Achieved      SLP SHORT TERM GOAL #4   Title  pt will tell SLP 3 overt s/s aspiration PNA with modified independence    Status  Achieved       SLP Long Term Goals - 06/10/17 1015      SLP LONG TERM GOAL #1   Title  pt will complete HEP with modified independence     Time  1    Period  -- visits    Status  On-going      SLP LONG TERM GOAL #2   Title  pt will tell how a food journal can expedite return to more normalized diet    Time  1    Period  -- visits    Status  On-going      SLP LONG TERM GOAL #3   Title  pt will tell SLP 3 overt s/s aspiration PNA over 2 visits    Baseline  06-10-17    Time  1    Period  -- visits    Status  On-going       Plan - 06/10/17 1013    Clinical Impression Statement  Pt with oropharyngeal swallowing continues essentially WFL with soft foods, with compensations of lt-sided chewing and lt-sided oral transit. However the probability of further swallowing difficulty increases dramatically with the initiation of radiation therapy. Pt will need to be followed by SLP for regular assessment  of accurate HEP completion as well as for safety with POs both during and following treatment/s.     Speech Therapy Frequency  -- approx once every four weeks    Duration  -- 3 total visits    Treatment/Interventions  Aspiration precaution training;Pharyngeal strengthening exercises;Diet toleration management by SLP;Trials of upgraded texture/liquids;Internal/external aids;Patient/family education;Compensatory strategies;SLP instruction and feedback;Environmental controls    Potential to Achieve Goals  Good       Patient will benefit from skilled therapeutic intervention in order to improve the following deficits and impairments:   Oropharyngeal dysphagia    Problem List Patient Active Problem List   Diagnosis Date Noted  . 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 Squamous cell carcinoma of tongue ([place]) 04/23/2017    SCHINKE,CARL ,MS, [place]  06/10/2017, 10:15 AM  [place] [address] [place], [place], [address] Phone: [phone]   Fax:  [phone]   Name: Nancy [name] MRN: [id number] Date of Birth: 01-04-1961

## 2017-06-11 ENCOUNTER — Ambulatory Visit
Admission: RE | Admit: 2017-06-11 | Discharge: 2017-06-11 | Disposition: A | Discharge status: Home / Self Care | Payer: Medicare Other | Source: Ambulatory Visit | Attending: Radiation Oncology | Admitting: Radiation Oncology

## 2017-06-11 ENCOUNTER — Ambulatory Visit: Payer: Medicare Other

## 2017-06-11 DIAGNOSIS — I890 Lymphedema, not elsewhere classified: Secondary | ICD-10-CM

## 2017-06-11 DIAGNOSIS — Z483 Aftercare following surgery for neoplasm: Secondary | ICD-10-CM | POA: Diagnosis not present

## 2017-06-11 DIAGNOSIS — Z510 Encounter for antineoplastic radiation therapy: Secondary | ICD-10-CM | POA: Diagnosis not present

## 2017-06-11 DIAGNOSIS — M25511 Pain in right shoulder: Secondary | ICD-10-CM

## 2017-06-11 NOTE — Therapy (Signed)
[place], [place], [address] Phone: [phone]   Fax:  [phone]  Physical Therapy Treatment  Patient Details  Name: Nancy [name] MRN: [id number] Date of Birth: 1960-12-14 Referring Provider: Dr. Colin Benton   Encounter Date: 06/11/2017  PT End of Session - 06/11/17 1103    Visit Number  15    Number of Visits  18    Date for PT Re-Evaluation  06/19/16    PT Start Time  1025    PT Stop Time  1105    PT Time Calculation (min)  40 min    Activity Tolerance  Patient tolerated treatment well    Behavior During Therapy  [facility] for tasks assessed/performed       Past Medical History:  Diagnosis Date  . Bipolar 1 disorder ([place])   . Cervical cancer ([place])   . GERD (gastroesophageal reflux disease)   . History of tracheostomy 03/04/2017   She had trach placed during surgery and reversed before discharge [facility])  . Hypertension   . Hypothyroid   . Manic depressive disorder ([place])   . Tongue cancer [facility])     Past Surgical History:  Procedure Laterality Date  . bone-skin graft     free osteocutaneous flap with microvascular anastomosis: other than iliac crest, metatarsal or great toe.   . cervical lymphadenectomy  03/04/2017   (modified radical neck dissection) [facility].   . EXCISION OF TONGUE LESION Right 03/04/2017   PR excis tongue, mouth, jaw. [facility])  . exploration, carotid  Left 03/04/2017   [facility].   . GLOSSECTOMY  03/04/2017   Glossectomy; composite WO Radical neck dissect [facility])  . LARYNGOSCOPY  11/22/2016   Laryngoscopy, direct, operative with biopsy, [place]  03/04/2017   Reconstruct mandible/ maxil subperiosteal implant, [facility]  . PARTIAL GLOSSECTOMY    . SKIN FULL THICKNESS GRAFT  03/04/2017   [place]    . VAGINAL HYSTERECTOMY      There were no vitals filed for this  visit.  Subjective Assessment - 06/11/17 1031    Subjective  My swelling at the throat is my biggest complaint today. My chest/shoulders are actually feeling really good, I've been stretching and took a really hot shower this morning and that really helps.     Pertinent History  chemo from july til sept 28. Oct 15 surgery for neck dissection, removal tongue and jaw with reconsturction with artery, skin and fibula from left leg. She has skin graft on left leg and has pain and swelling in leg.  She had physical therapy at home up until last week and is still getting home health RN for wound care  they took 46 lymph nodes and she has had swelling and decreased range of motion of her nedk and right arm     Patient Stated Goals  to get full range of motion of neck and shoulder and get rid of pain in leg and get help with swelling in her neck     Currently in Pain?  No/denies                      [facility] Adult PT Treatment/Exercise - 06/11/17 0001      Shoulder Exercises: Supine   Other Supine Exercises  Meeks shoulder presses and head press 5x for 5 second holds after MLD      Manual Therapy  Manual Lymphatic Drainage (MLD)  in supine with HOB elevated: short neck, diaphragmatic breathing, bil shoulder collectors, axillary and pectorla nodes and stationary circles at lateral and anterior neck directing fluid down and avoiding radiation areas, bilateral cheeks to posterior neck,                 PT Short Term Goals - 05/29/17 1029      PT SHORT TERM GOAL #1   Title  Pt will be independent in self manual lymph draiange and use of compression to manage neck lymphedema     Baseline  has not received compression garment and still needs to work on massage     Status  On-going      PT [place] #2   Title  Pt will be independent in a home exercise program for strength of UE and LE  and neck range of motion     Status  Achieved      PT SHORT TERM GOAL #3   Title  Pt will  increase active range of motin of right atm to 130 indicating increase in UE strength so that she can perfom household chores easier     Baseline  150    Status  Achieved      PT SHORT TERM GOAL #4   Title  Pt will report a decrease in overall pain by 25%    Baseline  pain is increased with radiation treatment     Status  On-going      PT SHORT TERM GOAL #5   Title  Pt will increase right grip strength by an average of 10#     Status  On-going        PT Long Term Goals - 05/29/17 1032      PT LONG TERM GOAL #1   Title  Pt will decrease quick DASH score to less than 38 indicating a functional improvment in left arm use     Time  8    Period  Weeks    Status  On-going      PT LONG TERM GOAL #2   Title  Pt will increase 30 second sit to stand by 2 repetitions showing improvment in functional strength    Baseline  16    Status  Achieved      PT LONG TERM GOAL #3   Title  pt will decrease TUG score by 2 seconds indicating an improvement in functional gait and balance.     Status  On-going      PT LONG TERM GOAL #4   Title  Pt will report a decrease in pain by 50%     Status  On-going            Plan - 06/11/17 1106    Clinical Impression Statement  Pt was struggling with opening her mouth before session and by end of session she was visibly able to open her mlouth more and she reported it feeling looser and with less swelling.     Rehab Potential  Good    Clinical Impairments Affecting Rehab Potential  extensive surgery ; currently undergoing radiation    PT Frequency  2x / week    PT Duration  8 weeks    PT Treatment/Interventions  ADLs/Self Care Home Management;Therapeutic activities;Therapeutic exercise;Taping;Manual techniques;Manual lymph drainage;Compression bandaging;Passive range of motion;Scar mobilization;Patient/family education;Functional mobility training;DME Instruction;Balance training;Neuromuscular re-education;Gait training;Stair training    PT Next  Visit Plan  continue with strengthening Assess neck  pain and results of treatment, If needed continue with soft tissue work andt MLD  if ok, progress shoulder exercise with review of supine scapular series  and closed chain shoulder circles and scapular work.  For leg.  progress exercise for leg strength and gait training if needed.  Possibly add bike /nustep. [place] Kitchen     Consulted and Agree with Plan of Care  Patient       Patient will benefit from skilled therapeutic intervention in order to improve the following deficits and impairments:  Abnormal gait, Decreased endurance, Decreased skin integrity, Impaired sensation, Increased edema, Decreased scar mobility, Decreased knowledge of precautions, Decreased activity tolerance, Decreased knowledge of use of DME, Decreased strength, Increased fascial restricitons, Impaired UE functional use, Pain, Difficulty walking, Decreased mobility, Decreased balance, Decreased range of motion, Impaired perceived functional ability, Postural dysfunction  Visit Diagnosis: Aftercare following surgery for neoplasm  Lymphedema, not elsewhere classified  Acute pain of right shoulder     Problem List Patient Active Problem List   Diagnosis Date Noted  . 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 Squamous cell carcinoma of tongue ([place]) 04/23/2017    [place] A[place] 06/11/2017, 11:08 AM  [place], [place], [address] Phone: [phone]   Fax:  [phone]  Name: Nancy [name] MRN: [id number] Date of Birth: [birth year]

## 2017-06-12 ENCOUNTER — Ambulatory Visit
Admission: RE | Admit: 2017-06-12 | Discharge: 2017-06-12 | Disposition: A | Discharge status: Home / Self Care | Payer: Medicare Other | Source: Ambulatory Visit | Attending: Radiation Oncology | Admitting: Radiation Oncology

## 2017-06-12 DIAGNOSIS — Z510 Encounter for antineoplastic radiation therapy: Secondary | ICD-10-CM | POA: Diagnosis not present

## 2017-06-13 ENCOUNTER — Ambulatory Visit
Admission: RE | Admit: 2017-06-13 | Discharge: 2017-06-13 | Disposition: A | Discharge status: Home / Self Care | Payer: Medicare Other | Source: Ambulatory Visit | Attending: Radiation Oncology | Admitting: Radiation Oncology

## 2017-06-13 ENCOUNTER — Ambulatory Visit: Payer: Medicare Other

## 2017-06-13 DIAGNOSIS — Z483 Aftercare following surgery for neoplasm: Secondary | ICD-10-CM | POA: Diagnosis not present

## 2017-06-13 DIAGNOSIS — Z510 Encounter for antineoplastic radiation therapy: Secondary | ICD-10-CM | POA: Diagnosis not present

## 2017-06-13 DIAGNOSIS — I890 Lymphedema, not elsewhere classified: Secondary | ICD-10-CM

## 2017-06-13 NOTE — Therapy (Signed)
[place], [place], [address] Phone: [phone]   Fax:  [phone]  Physical Therapy Treatment  Patient Details  Name: Nancy [name] MRN: [id number] Date of Birth: December 04, 1960 Referring Provider: Dr. Colin Benton   Encounter Date: 06/13/2017  PT End of Session - 06/13/17 1106    Visit Number  16    Number of Visits  18    Date for PT Re-Evaluation  06/19/16    PT Start Time  1025    PT Stop Time  1105    PT Time Calculation (min)  40 min    Activity Tolerance  Patient tolerated treatment well;Treatment limited secondary to medical complications (Comment) Pt feeling nauseous from chemo and not up to exercising    Behavior During Therapy  [name] for tasks assessed/performed       Past Medical History:  Diagnosis Date  . Bipolar 1 disorder ([place])   . Cervical cancer ([place])   . GERD (gastroesophageal reflux disease)   . History of tracheostomy 03/04/2017   She had trach placed during surgery and reversed before discharge [facility])  . Hypertension   . Hypothyroid   . Manic depressive disorder ([name])   . Tongue cancer [facility])     Past Surgical History:  Procedure Laterality Date  . bone-skin graft     free osteocutaneous flap with microvascular anastomosis: other than iliac crest, metatarsal or great toe.   . cervical lymphadenectomy  03/04/2017   (modified radical neck dissection) [facility].   . EXCISION OF TONGUE LESION Right 03/04/2017   PR excis tongue, mouth, jaw. [facility])  . exploration, carotid  Left 03/04/2017   [facility].   . GLOSSECTOMY  03/04/2017   Glossectomy; composite WO Radical neck dissect [facility])  . LARYNGOSCOPY  11/22/2016   Laryngoscopy, direct, operative with biopsy, [place]  03/04/2017   Reconstruct mandible/ maxil subperiosteal implant, [facility]  . PARTIAL GLOSSECTOMY    . SKIN FULL THICKNESS GRAFT  03/04/2017    [place]    . VAGINAL HYSTERECTOMY      There were no vitals filed for this visit.  Subjective Assessment - 06/13/17 1030    Subjective  My neck/throat is feeling much worse in past 2 days. I was really nauseous the past 2 days so I started taking my Zofran that I still had from chemo. That has helped ALOT.    Pertinent History  chemo from july til sept 28. Oct 15 surgery for neck dissection, removal tongue and jaw with reconsturction with artery, skin and fibula from left leg. She has skin graft on left leg and has pain and swelling in leg.  She had physical therapy at home up until last week and is still getting home health RN for wound care  they took 26 lymph nodes and she has had swelling and decreased range of motion of her nedk and right arm     Patient Stated Goals  to get full range of motion of neck and shoulder and get rid of pain in leg and get help with swelling in her neck     Currently in Pain?  Yes    Pain Score  7     Pain Location  Throat    Pain Descriptors / Indicators  Other (Comment) Raw    Pain Type  Acute pain    Pain Onset  Yesterday    Aggravating  Factors   eating and drinking mostly, but the radiation is whats making it worse    Pain Relieving Factors  club soda                      [facility] Adult PT Treatment/Exercise - 06/13/17 0001      Manual Therapy   Manual Lymphatic Drainage (MLD)  in supine with HOB elevated: short neck, diaphragmatic breathing, bil shoulder collectors, axillary and pectoral nodes, bil upper quadrants and then stationary circles at lateral and anterior neck directing fluid down and avoiding radiation areas, bilateral cheeks to posterior neck,                 PT Short Term Goals - 05/29/17 1029      PT SHORT TERM GOAL #1   Title  Pt will be independent in self manual lymph draiange and use of compression to manage neck lymphedema     Baseline  has not received compression garment and still  needs to work on massage     Status  On-going      PT [place] #2   Title  Pt will be independent in a home exercise program for strength of UE and LE  and neck range of motion     Status  Achieved      PT SHORT TERM GOAL #3   Title  Pt will increase active range of motin of right atm to 130 indicating increase in UE strength so that she can perfom household chores easier     Baseline  150    Status  Achieved      PT SHORT TERM GOAL #4   Title  Pt will report a decrease in overall pain by 25%    Baseline  pain is increased with radiation treatment     Status  On-going      PT SHORT TERM GOAL #5   Title  Pt will increase right grip strength by an average of 10#     Status  On-going        PT Long Term Goals - 05/29/17 1032      PT LONG TERM GOAL #1   Title  Pt will decrease quick DASH score to less than 38 indicating a functional improvment in left arm use     Time  8    Period  Weeks    Status  On-going      PT LONG TERM GOAL #2   Title  Pt will increase 30 second sit to stand by 2 repetitions showing improvment in functional strength    Baseline  16    Status  Achieved      PT LONG TERM GOAL #3   Title  pt will decrease TUG score by 2 seconds indicating an improvement in functional gait and balance.     Status  On-going      PT LONG TERM GOAL #4   Title  Pt will report a decrease in pain by 50%     Status  On-going            Plan - 06/13/17 1106    Clinical Impression Statement  Pts soreness, redness and throat irritation is worse today and her radiation oncologist warned her it would be for last treatments (4 more after today). Pt also reporting nauseousness began yesterday but Zofran has really helped keep it manageable, no vomitting. Continued with manual lymph drainage as this is all  pt can tolerate at this time and reports this really helps increase how much she can open her mouth. Pt looks forward to having radiation over after Tuesday.     Rehab  Potential  Good    Clinical Impairments Affecting Rehab Potential  extensive surgery ; currently undergoing radiation    PT Frequency  2x / week    PT Duration  8 weeks    PT Treatment/Interventions  ADLs/Self Care Home Management;Therapeutic activities;Therapeutic exercise;Taping;Manual techniques;Manual lymph drainage;Compression bandaging;Passive range of motion;Scar mobilization;Patient/family education;Functional mobility training;DME Instruction;Balance training;Neuromuscular re-education;Gait training;Stair training    PT Next Visit Plan  continue with strengthening when able after feeling recovered from radiation; Assess neck pain and results of treatment, If needed continue with soft tissue work andt MLD  if ok, progress shoulder exercise with review of supine scapular series  and closed chain shoulder circles and scapular work.  For leg.  progress exercise for leg strength and gait training if needed.  Possibly add bike /nustep. Marland Kitchen     Consulted and Agree with Plan of Care  Patient       Patient will benefit from skilled therapeutic intervention in order to improve the following deficits and impairments:  Abnormal gait, Decreased endurance, Decreased skin integrity, Impaired sensation, Increased edema, Decreased scar mobility, Decreased knowledge of precautions, Decreased activity tolerance, Decreased knowledge of use of DME, Decreased strength, Increased fascial restricitons, Impaired UE functional use, Pain, Difficulty walking, Decreased mobility, Decreased balance, Decreased range of motion, Impaired perceived functional ability, Postural dysfunction  Visit Diagnosis: Aftercare following surgery for neoplasm  Lymphedema, not elsewhere classified     Problem List Patient Active Problem List   Diagnosis Date Noted  . 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 Squamous cell carcinoma of tongue ([facility])  04/23/2017    Otelia Limes, PTA 06/13/2017, 11:10 AM  [place], [place], [address] Phone: [phone]   Fax:  [phone]  Name: Nancy [name] MRN: [id number] Date of Birth: 12/29/1960

## 2017-06-14 ENCOUNTER — Other Ambulatory Visit: Payer: Self-pay | Admitting: Radiation Oncology

## 2017-06-14 ENCOUNTER — Ambulatory Visit
Admission: RE | Admit: 2017-06-14 | Discharge: 2017-06-14 | Disposition: A | Discharge status: Home / Self Care | Payer: Medicare Other | Source: Ambulatory Visit | Attending: Radiation Oncology | Admitting: Radiation Oncology

## 2017-06-14 ENCOUNTER — Ambulatory Visit: Payer: Medicare Other | Admitting: Nutrition

## 2017-06-14 DIAGNOSIS — C021 Malignant neoplasm of border of tongue: Secondary | ICD-10-CM

## 2017-06-14 DIAGNOSIS — Z510 Encounter for antineoplastic radiation therapy: Secondary | ICD-10-CM | POA: Diagnosis not present

## 2017-06-14 MED ORDER — FENTANYL 25 MCG/HR TD PT72: 25.0000 ug | MEDICATED_PATCH | TRANSDERMAL | 0 refills | Status: DC

## 2017-06-14 MED FILL — fentaNYL 25 MCG/HR PT72: 25 | 15 days supply | Qty: 5 | Fill #0

## 2017-06-14 NOTE — Progress Notes (Signed)
Nutrition follow-up completed with patient after radiation therapy for tongue cancer. Patient states her mouth is very sore and she has difficulty opening her mouth wide. She is unable to eat solid foods at this time but can drink Ensure Plus and is swallowing broth. Weight decreased to 173.6 pounds down from 180 pounds. Patient is not taking any pain medication but plans on asking her physician for a prescription.  Nutrition diagnosis: Inadequate oral intake continues.  Intervention: Patient was educated to consume 6-7 bottles of Ensure Plus daily. Educated patient on strategies to reduce textures of foods for easier swallowing. Encouraged patient to talk physician regarding pain control. Provided second complimentary case of Ensure Plus. Questions answered.  Teach back method used.  Monitoring, evaluation, goals: Patient will increase oral intake to minimize further weight loss.  Next visit: Monday, January 28.  **Disclaimer: This note was dictated with voice recognition software. Similar sounding words can inadvertently be transcribed and this note may contain transcription errors which may not have been corrected upon publication of note.**

## 2017-06-17 ENCOUNTER — Ambulatory Visit: Payer: Medicare Other | Admitting: Nutrition

## 2017-06-17 ENCOUNTER — Ambulatory Visit
Admission: RE | Admit: 2017-06-17 | Discharge: 2017-06-17 | Disposition: A | Discharge status: Home / Self Care | Payer: Medicare Other | Source: Ambulatory Visit | Attending: Radiation Oncology | Admitting: Radiation Oncology

## 2017-06-17 ENCOUNTER — Other Ambulatory Visit: Payer: Self-pay | Admitting: Radiation Oncology

## 2017-06-17 DIAGNOSIS — C029 Malignant neoplasm of tongue, unspecified: Secondary | ICD-10-CM

## 2017-06-17 DIAGNOSIS — Z510 Encounter for antineoplastic radiation therapy: Secondary | ICD-10-CM | POA: Diagnosis not present

## 2017-06-17 MED ORDER — SONAFINE EX EMUL
1.0000 "application " | Freq: Once | CUTANEOUS | Status: AC
  Administered 2017-06-17: 1 via TOPICAL

## 2017-06-17 MED ORDER — ONDANSETRON HCL 8 MG PO TABS: 8.0000 mg | ORAL_TABLET | Freq: Three times a day (TID) | ORAL | 2 refills | Status: AC | PRN

## 2017-06-17 MED FILL — ONDANSETRON HCL 8 MG TAB: 8 | 10 days supply | Qty: 30 | Fill #0

## 2017-06-17 MED FILL — LIDOCAINE 2% VISCOUS SOLN: 2 | 5 days supply | Qty: 100 | Fill #2

## 2017-06-17 NOTE — Progress Notes (Signed)
Nutrition follow-up completed with patient after radiation therapy for tongue cancer. Patient finishes radiation therapy tomorrow Tuesday, January 29. She is drinking of approximately 5 Ensure Plus daily and 5-16 oz bottles of water Patient is trying to eat some soft foods. Weight decreased and documented as 172.4 pounds down from 173.6 pounds on Friday. Patient try fentanyl patch but did not like how it made her feel. She reports she is not in a lot of pain today.  Nutrition diagnosis: Inadequate oral intake continues.  Intervention: Educated patient to increase Ensure Plus 6-7 bottles daily. Continue soft foods as tolerated. Teach back method used.  Monitoring, evaluation, goals: Patient will work to increase calories and protein to promote healing.  Next visit: Tuesday, February 19 before M.D.  **Disclaimer: This note was dictated with voice recognition software. Similar sounding words can inadvertently be transcribed and this note may contain transcription errors which may not have been corrected upon publication of note.**

## 2017-06-18 ENCOUNTER — Encounter: Payer: Self-pay | Admitting: *Deleted

## 2017-06-18 ENCOUNTER — Ambulatory Visit
Admission: RE | Admit: 2017-06-18 | Discharge: 2017-06-18 | Disposition: A | Discharge status: Home / Self Care | Payer: Medicare Other | Source: Ambulatory Visit | Attending: Radiation Oncology | Admitting: Radiation Oncology

## 2017-06-18 ENCOUNTER — Ambulatory Visit: Payer: Medicare Other

## 2017-06-18 DIAGNOSIS — Z483 Aftercare following surgery for neoplasm: Secondary | ICD-10-CM

## 2017-06-18 DIAGNOSIS — Z510 Encounter for antineoplastic radiation therapy: Secondary | ICD-10-CM | POA: Diagnosis not present

## 2017-06-18 DIAGNOSIS — I890 Lymphedema, not elsewhere classified: Secondary | ICD-10-CM

## 2017-06-18 NOTE — Therapy (Signed)
[place], [place], [address] Phone: [phone]   Fax:  [phone]  Physical Therapy Treatment  Patient Details  Name: Nancy [name] MRN: [id number] Date of Birth: [birth year] Referring Provider: Dr. Colin Benton   Encounter Date: 06/18/2017  PT End of Session - 06/18/17 1105    Visit Number  17    Number of Visits  18    Date for PT Re-Evaluation  06/19/16    PT Start Time  1024    PT Stop Time  1106    PT Time Calculation (min)  42 min    Activity Tolerance  Patient tolerated treatment well;Treatment limited secondary to medical complications (Comment) Pt conts with nauseous and now dizziness so limited treatment    Behavior During Therapy  [facility] for tasks assessed/performed       Past Medical History:  Diagnosis Date  . Bipolar 1 disorder ([place])   . Cervical cancer ([place])   . GERD (gastroesophageal reflux disease)   . History of tracheostomy 03/04/2017   She had trach placed during surgery and reversed before discharge [facility])  . Hypertension   . Hypothyroid   . Manic depressive disorder ([place])   . Tongue cancer [facility])     Past Surgical History:  Procedure Laterality Date  . bone-skin graft     free osteocutaneous flap with microvascular anastomosis: other than iliac crest, metatarsal or great toe.   . cervical lymphadenectomy  03/04/2017   (modified radical neck dissection) [facility].   . EXCISION OF TONGUE LESION Right 03/04/2017   PR excis tongue, mouth, jaw. [facility])  . exploration, carotid  Left 03/04/2017   [facility].   . GLOSSECTOMY  03/04/2017   Glossectomy; composite WO Radical neck dissect [facility])  . LARYNGOSCOPY  11/22/2016   Laryngoscopy, direct, operative with biopsy, [place]  03/04/2017   Reconstruct mandible/ maxil subperiosteal implant, [facility]  . PARTIAL GLOSSECTOMY    . SKIN FULL THICKNESS GRAFT   03/04/2017   [place]    . VAGINAL HYSTERECTOMY      There were no vitals filed for this visit.  Subjective Assessment - 06/18/17 1031    Subjective  I have a sore in my throat thats bothering me but my voice is better. I was really nauseous over the weekend and didn't feel well at all. Pretty dizzy today but it's my last day of radiation so I had to come out anyways. I'm excited to be done!    Pertinent History  chemo from july til sept 28. Oct 15 surgery for neck dissection, removal tongue and jaw with reconsturction with artery, skin and fibula from left leg. She has skin graft on left leg and has pain and swelling in leg.  She had physical therapy at home up until last week and is still getting home health RN for wound care  they took 79 lymph nodes and she has had swelling and decreased range of motion of her nedk and right arm     Patient Stated Goals  to get full range of motion of neck and shoulder and get rid of pain in leg and get help with swelling in her neck     Currently in Pain?  No/denies                      [facility] Adult PT Treatment/Exercise - 06/18/17 0001  Manual Therapy   Manual Lymphatic Drainage (MLD)  in supine with HOB elevated: short neck, diaphragmatic breathing, bil shoulder collectors, axillary and pectoral nodes, bil upper quadrants and then stationary circles at lateral and anterior neck directing fluid down and avoiding radiation areas, bilateral cheeks to posterior neck,                 PT Short Term Goals - 05/29/17 1029      PT SHORT TERM GOAL #1   Title  Pt will be independent in self manual lymph draiange and use of compression to manage neck lymphedema     Baseline  has not received compression garment and still needs to work on massage     Status  On-going      PT [place] #2   Title  Pt will be independent in a home exercise program for strength of UE and LE  and neck range of motion      Status  Achieved      PT SHORT TERM GOAL #3   Title  Pt will increase active range of motin of right atm to 130 indicating increase in UE strength so that she can perfom household chores easier     Baseline  150    Status  Achieved      PT SHORT TERM GOAL #4   Title  Pt will report a decrease in overall pain by 25%    Baseline  pain is increased with radiation treatment     Status  On-going      PT SHORT TERM GOAL #5   Title  Pt will increase right grip strength by an average of 10#     Status  On-going        PT Long Term Goals - 05/29/17 1032      PT LONG TERM GOAL #1   Title  Pt will decrease quick DASH score to less than 38 indicating a functional improvment in left arm use     Time  8    Period  Weeks    Status  On-going      PT LONG TERM GOAL #2   Title  Pt will increase 30 second sit to stand by 2 repetitions showing improvment in functional strength    Baseline  16    Status  Achieved      PT LONG TERM GOAL #3   Title  pt will decrease TUG score by 2 seconds indicating an improvement in functional gait and balance.     Status  On-going      PT LONG TERM GOAL #4   Title  Pt will report a decrease in pain by 50%     Status  On-going            Plan - 06/18/17 1108    Clinical Impression Statement  Pt reports having a sore on the inside of her throat but her voice was improved today. Swelling is pts biggest complaint with causing her discomfort so continued to focus on this today, but also due to pt reporting feeling dizzy and nauseous, therefore still unable to tolerate exercising. Her swelling continues to show improvement by the end of each session.     Rehab Potential  Good    Clinical Impairments Affecting Rehab Potential  extensive surgery ; currently undergoing radiation    PT Frequency  2x / week    PT Duration  8 weeks    PT Treatment/Interventions  ADLs/Self Care Home Management;Therapeutic activities;Therapeutic exercise;Taping;Manual  techniques;Manual lymph drainage;Compression bandaging;Passive range of motion;Scar mobilization;Patient/family education;Functional mobility training;DME Instruction;Balance training;Neuromuscular re-education;Gait training;Stair training    PT Next Visit Plan  continue with strengthening when able after feeling recovered from radiation; Assess neck pain and results of treatment, If needed continue with soft tissue work andt MLD  if ok, progress shoulder exercise with review of supine scapular series  and closed chain shoulder circles and scapular work.  For leg.  progress exercise for leg strength and gait training if needed.  Possibly add bike /nustep. Marland Kitchen     Consulted and Agree with Plan of Care  Patient       Patient will benefit from skilled therapeutic intervention in order to improve the following deficits and impairments:  Abnormal gait, Decreased endurance, Decreased skin integrity, Impaired sensation, Increased edema, Decreased scar mobility, Decreased knowledge of precautions, Decreased activity tolerance, Decreased knowledge of use of DME, Decreased strength, Increased fascial restricitons, Impaired UE functional use, Pain, Difficulty walking, Decreased mobility, Decreased balance, Decreased range of motion, Impaired perceived functional ability, Postural dysfunction  Visit Diagnosis: Aftercare following surgery for neoplasm  Lymphedema, not elsewhere classified     Problem List Patient Active Problem List   Diagnosis Date Noted  . 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 Squamous cell carcinoma of tongue ([facility]) 04/23/2017    Otelia Limes, PTA 06/18/2017, 11:17 AM  [place], [place], [address] Phone: [phone]   Fax:  [phone]  Name: BRITISH MOYD MRN: [id number] Date of Birth: 02-Sep-1960

## 2017-06-19 MED FILL — LIDOCAINE 2% VISCOUS SOLN: 2 | 5 days supply | Qty: 100 | Fill #3

## 2017-06-19 NOTE — Progress Notes (Signed)
Oncology Nurse Navigator Documentation  Met with pt during final RT to offer support and to celebrate end of radiation treatment.  She was accompanied by her father and step-mother. I provided verbal/written post-RT guidance:  Importance of keeping all follow-up appts, especially those with Nutrition and SLP.  Importance of protecting treatment area from sun.  Continuation of Sonafine application 2-3 times daily until supply exhausted after which transition to OTC lotion with vitamin E. I explained that my role as navigator will continue for several more months and that I will be calling and/or joining him during follow-up visits.   I encouraged her to call me with needs/concerns.   Patient and family verbalized understanding of information provided.  Gayleen Orem, RN, BSN, [place] at [place] [phone]

## 2017-06-20 ENCOUNTER — Ambulatory Visit: Payer: Medicare Other

## 2017-06-20 DIAGNOSIS — I890 Lymphedema, not elsewhere classified: Secondary | ICD-10-CM

## 2017-06-20 DIAGNOSIS — Z483 Aftercare following surgery for neoplasm: Secondary | ICD-10-CM | POA: Diagnosis not present

## 2017-06-20 NOTE — Therapy (Signed)
[place], [place], [address] Phone: [phone]   Fax:  [phone]  Physical Therapy Treatment  Patient Details  Name: Nancy [name] MRN: [id number] Date of Birth: Apr 01, 1961 Referring Provider: Dr. Colin Benton   Encounter Date: 06/20/2017  PT End of Session - 06/20/17 1109    Visit Number  18    Number of Visits  18    Date for PT Re-Evaluation  06/19/16    PT Start Time  1025    PT Stop Time  1108    PT Time Calculation (min)  43 min    Activity Tolerance  Patient tolerated treatment well    Behavior During Therapy  [facility] for tasks assessed/performed       Past Medical History:  Diagnosis Date  . Bipolar 1 disorder ([place])   . Cervical cancer ([place])   . GERD (gastroesophageal reflux disease)   . History of tracheostomy 03/04/2017   She had trach placed during surgery and reversed before discharge [facility])  . Hypertension   . Hypothyroid   . Manic depressive disorder ([place])   . Tongue cancer [facility])     Past Surgical History:  Procedure Laterality Date  . bone-skin graft     free osteocutaneous flap with microvascular anastomosis: other than iliac crest, metatarsal or great toe.   . cervical lymphadenectomy  03/04/2017   (modified radical neck dissection) [facility].   . EXCISION OF TONGUE LESION Right 03/04/2017   PR excis tongue, mouth, jaw. [facility])  . exploration, carotid  Left 03/04/2017   [facility].   . GLOSSECTOMY  03/04/2017   Glossectomy; composite WO Radical neck dissect [facility])  . LARYNGOSCOPY  11/22/2016   Laryngoscopy, direct, operative with biopsy, [place]  03/04/2017   Reconstruct mandible/ maxil subperiosteal implant, [facility]  . PARTIAL GLOSSECTOMY    . SKIN FULL THICKNESS GRAFT  03/04/2017   Newborn    . VAGINAL HYSTERECTOMY      There were no vitals filed for this  visit.  Subjective Assessment - 06/20/17 1029    Subjective  I finished radiation!! I am so happy and can already tell some improvement with my throat pain. I'm still really swollen though on my gums, I haven't been able to use my fluoride trays. I'm hoping that gets better soon bc I can't eat with my gums feeling like this.     Pertinent History  chemo from july til sept 28. Oct 15 surgery for neck dissection, removal tongue and jaw with reconsturction with artery, skin and fibula from left leg. She has skin graft on left leg and has pain and swelling in leg.  She had physical therapy at home up until last week and is still getting home health RN for wound care  they took 7 lymph nodes and she has had swelling and decreased range of motion of her nedk and right arm     Patient Stated Goals  to get full range of motion of neck and shoulder and get rid of pain in leg and get help with swelling in her neck     Pain Score  1     Pain Location  Other (Comment) cheek    Pain Orientation  Right    Pain Descriptors / Indicators  Sore    Pain Type  Acute pain    Pain Onset  1 to 4 weeks ago  Pain Frequency  Constant    Aggravating Factors   from radiation    Pain Relieving Factors  manual lymph drainage                      [facility] Adult PT Treatment/Exercise - 06/20/17 0001      Shoulder Exercises: Supine   Horizontal ABduction  Strengthening;Both;10 reps;Theraband    Theraband Level (Shoulder Horizontal ABduction)  Level 2 (Red)    External Rotation  Strengthening;Both;10 reps;Theraband    Theraband Level (Shoulder External Rotation)  Level 2 (Red)    Flexion  Strengthening;Both;10 reps;Theraband Narrow and Wide Grip, 10 times each    Theraband Level (Shoulder Flexion)  Level 2 (Red)    Other Supine Exercises  Bil D2 with red theraband 5 times each with pt returning correct therapist demonstration for review of technique as pt hasn't done these in awhile      Manual Therapy    Manual Lymphatic Drainage (MLD)  in supine with HOB elevated: short neck, diaphragmatic breathing, bil shoulder collectors, axillary and pectoral nodes, bil upper quadrants and then stationary circles at lateral and anterior neck directing fluid down and avoiding radiation areas, bilateral cheeks to posterior neck,                 PT Short Term Goals - 05/29/17 1029      PT SHORT TERM GOAL #1   Title  Pt will be independent in self manual lymph draiange and use of compression to manage neck lymphedema     Baseline  has not received compression garment and still needs to work on massage     Status  On-going      PT SHORT TERM GOAL #2   Title  Pt will be independent in a home exercise program for strength of UE and LE  and neck range of motion     Status  Achieved      PT SHORT TERM GOAL #3   Title  Pt will increase active range of motin of right atm to 130 indicating increase in UE strength so that she can perfom household chores easier     Baseline  150    Status  Achieved      PT SHORT TERM GOAL #4   Title  Pt will report a decrease in overall pain by 25%    Baseline  pain is increased with radiation treatment     Status  On-going      PT SHORT TERM GOAL #5   Title  Pt will increase right grip strength by an average of 10#     Status  On-going        PT Long Term Goals - 05/29/17 1032      PT LONG TERM GOAL #1   Title  Pt will decrease quick DASH score to less than 38 indicating a functional improvment in left arm use     Time  8    Period  Weeks    Status  On-going      PT LONG TERM GOAL #2   Title  Pt will increase 30 second sit to stand by 2 repetitions showing improvment in functional strength    Baseline  16    Status  Achieved      PT LONG TERM GOAL #3   Title  pt will decrease TUG score by 2 seconds indicating an improvement in functional gait and balance.     Status  On-going      PT LONG TERM GOAL #4   Title  Pt will report a decrease in pain by 50%      Status  On-going            Plan - 06/20/17 1111    Clinical Impression Statement  Pt was in much better spirits today having completed radiation Tuesday and already starting to notice some improvement in her pain/sore in jaw and on gums. Tolerated supine scapular series well today (issued red but instructed pt to use yellow at home for next 1-2 weeks as her strength returns). Still reports trouble with eating due to having difficulty opening her mouth from the swelling but reports this reduces enough temporarily after each session of MLD allowing her to eat with some greater ease. Pt would like to cont coming as she was progressing well before starting radiation but suffered setback with her swelling and weakness, and would like to resume progress to previous goals.     Rehab Potential  Good    Clinical Impairments Affecting Rehab Potential  extensive surgery ; completed radiation 06/18/17    PT Frequency  2x / week    PT Duration  8 weeks    PT Treatment/Interventions  ADLs/Self Care Home Management;Therapeutic activities;Therapeutic exercise;Taping;Manual techniques;Manual lymph drainage;Compression bandaging;Passive range of motion;Scar mobilization;Patient/family education;Functional mobility training;DME Instruction;Balance training;Neuromuscular re-education;Gait training;Stair training    PT Next Visit Plan  Renewal needed next visit; continue with strengthening when able after feeling recovered from radiation; Assess neck pain and results of treatment, If needed cont MLD; when ok, progress shoulder exercise with review of supine scapular series  and closed chain shoulder circles and scapular work.  For leg: progress exercise for leg strength and gait training if needed.  Possibly add bike /nustep. Marland Kitchen     Consulted and Agree with Plan of Care  Patient       Patient will benefit from skilled therapeutic intervention in order to improve the following deficits and impairments:  Abnormal  gait, Decreased endurance, Decreased skin integrity, Impaired sensation, Increased edema, Decreased scar mobility, Decreased knowledge of precautions, Decreased activity tolerance, Decreased knowledge of use of DME, Decreased strength, Increased fascial restricitons, Impaired UE functional use, Pain, Difficulty walking, Decreased mobility, Decreased balance, Decreased range of motion, Impaired perceived functional ability, Postural dysfunction  Visit Diagnosis: Aftercare following surgery for neoplasm  Lymphedema, not elsewhere classified     Problem List Patient Active Problem List   Diagnosis Date Noted  . 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 Squamous cell carcinoma of tongue ([facility]) 04/23/2017    Otelia Limes, PTA 06/20/2017, 11:28 AM  [place], [place], [address] Phone: [phone]   Fax:  [phone]  Name: Nancy [name] MRN: [id number] Date of Birth: 1960-06-20

## 2017-06-21 ENCOUNTER — Encounter: Payer: Self-pay | Admitting: Radiation Oncology

## 2017-06-21 NOTE — Progress Notes (Signed)
  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 At the beginning of treatment, pt reported no concerns and she was advised to use sonafine on her skin BID. Towards the end of treatment, pt reported mouth pain, mouth sores, and weight loss (8lbs). She was also consuming 6 cans of ensure daily and 5 16 oz containers of water. She also reported that she was unable to tolerate fentanyl patch due to lightheadedness. She did tolerate MMW and sucralfate.  On PE she had ulcers in the mouth without thrush. Gabapentin was gradually titrated up for pain control.  Plan: The patient has completed radiation treatment. The patient will return to radiation oncology clinic for routine followup in one month. I advised them to call or return sooner if they have any questions or concerns related to their recovery or treatment.  -----------------------------------  Eppie Gibson, MD  This document serves as a record of services personally performed by Eppie Gibson, MD. It was created on her behalf by Steva Colder, a trained medical scribe. The creation of this record is based on the scribe's personal observations and the provider's statements to them. This document has been checked and approved by the  attending provider.

## 2017-06-24 ENCOUNTER — Other Ambulatory Visit: Payer: Self-pay | Admitting: Radiation Oncology

## 2017-06-24 DIAGNOSIS — C029 Malignant neoplasm of tongue, unspecified: Secondary | ICD-10-CM

## 2017-06-24 MED ORDER — LIDOCAINE VISCOUS 2 % MT SOLN: OROMUCOSAL | 2 refills | Status: AC

## 2017-06-24 MED FILL — LIDOCAINE 2% VISCOUS SOLN: 2 | 15 days supply | Qty: 300 | Fill #0

## 2017-06-27 ENCOUNTER — Ambulatory Visit: Payer: Medicare Other | Attending: Otolaryngology | Admitting: Physical Therapy

## 2017-06-27 ENCOUNTER — Encounter: Payer: Self-pay | Admitting: Physical Therapy

## 2017-06-27 DIAGNOSIS — R1312 Dysphagia, oropharyngeal phase: Secondary | ICD-10-CM | POA: Diagnosis present

## 2017-06-27 DIAGNOSIS — Z483 Aftercare following surgery for neoplasm: Secondary | ICD-10-CM | POA: Insufficient documentation

## 2017-06-27 DIAGNOSIS — M6281 Muscle weakness (generalized): Secondary | ICD-10-CM | POA: Insufficient documentation

## 2017-06-27 DIAGNOSIS — I890 Lymphedema, not elsewhere classified: Secondary | ICD-10-CM | POA: Diagnosis present

## 2017-06-27 DIAGNOSIS — M25511 Pain in right shoulder: Secondary | ICD-10-CM | POA: Insufficient documentation

## 2017-06-27 NOTE — Therapy (Signed)
[place], [place], [address] Phone: [phone]   Fax:  [phone]  Physical Therapy Treatment  Patient Details  Name: Nancy [name] MRN: [id number] Date of Birth: [birth year] Referring Provider: Dr. Colin Benton    Encounter Date: 06/27/2017  PT End of Session - 06/27/17 1800    Visit Number  19    Number of Visits  35    Date for PT Re-Evaluation  08/25/16    PT Start Time  1300    PT Stop Time  1345    PT Time Calculation (min)  45 min    Activity Tolerance  Patient tolerated treatment well    Behavior During Therapy  [facility] for tasks assessed/performed       Past Medical History:  Diagnosis Date  . Bipolar 1 disorder ([place])   . Cervical cancer ([place])   . GERD (gastroesophageal reflux disease)   . History of tracheostomy 03/04/2017   She had trach placed during surgery and reversed before discharge [facility])  . Hypertension   . Hypothyroid   . Manic depressive disorder ([place])   . Tongue cancer [facility])     Past Surgical History:  Procedure Laterality Date  . bone-skin graft     free osteocutaneous flap with microvascular anastomosis: other than iliac crest, metatarsal or great toe.   . cervical lymphadenectomy  03/04/2017   (modified radical neck dissection) [facility].   . EXCISION OF TONGUE LESION Right 03/04/2017   PR excis tongue, mouth, jaw. [facility])  . exploration, carotid  Left 03/04/2017   [facility].   . GLOSSECTOMY  03/04/2017   Glossectomy; composite WO Radical neck dissect [facility])  . LARYNGOSCOPY  11/22/2016   Laryngoscopy, direct, operative with biopsy, [place]  03/04/2017   Reconstruct mandible/ maxil subperiosteal implant, [facility]  . PARTIAL GLOSSECTOMY    . SKIN FULL THICKNESS GRAFT  03/04/2017   [place]    . VAGINAL HYSTERECTOMY      There were no vitals filed for this  visit.  Subjective Assessment - 06/27/17 1306    Subjective  Pt has completed radiation and is doing well.  She still hass some sores in her mouth and is going next week for evaluation of teeth alignment.  She feels improvement in her arm with occasional pain in right upper chest below the collar bone, She feels that her leg is good.  The wound is closed and she is walking better  She is doing an exercise program She has the compression garment but has not been able to wear it because she has swelling on the inside.  She has been getting some relief from the manual lymph drainage when she comes here and it fills up after the third day     Pertinent History  chemo from july til sept 28. Oct 15 surgery for neck dissection, removal tongue and jaw with reconsturction with artery, skin and fibula from left leg. She has skin graft on left leg and has pain and swelling in leg.  She had physical therapy at home up until last week and is still getting home health RN for wound care  they took 36 lymph nodes and she has had swelling and decreased range of motion of her nedk and right arm     Patient Stated Goals  to get full range of motion of neck and shoulder and get rid of  pain in leg and get help with swelling in her neck          [facility] PT Assessment - 06/27/17 0001      Assessment   Medical Diagnosis  tongue cancer    Referring Provider  Dr. Colin Benton       Prior Function   Level of Independence  Independent      AROM   Overall AROM Comments  Right grip strength 45/46/50/   left grip 52/55/55/      Strength   Right Shoulder Flexion  3/5    Right Shoulder ABduction  3/5      Timed Up and Go Test   Normal TUG (seconds)  5.79           Quick Dash - 06/27/17 0001    Open a tight or new jar  Severe difficulty    Do heavy household chores (wash walls, wash floors)  Moderate difficulty    Carry a shopping bag or briefcase  Moderate difficulty    Wash your back  Mild difficulty    Use  a knife to cut food  No difficulty    Recreational activities in which you take some force or impact through your arm, shoulder, or hand (golf, hammering, tennis)  Severe difficulty    During the past week, to what extent has your arm, shoulder or hand problem interfered with your normal social activities with family, friends, neighbors, or groups?  Slightly    During the past week, to what extent has your arm, shoulder or hand problem limited your work or other regular daily activities  Slightly    Arm, shoulder, or hand pain.  Mild    Tingling (pins and needles) in your arm, shoulder, or hand  None    Difficulty Sleeping  No difficulty    DASH Score  31.82 %            [facility] Adult PT Treatment/Exercise - 06/27/17 0001      Manual Therapy   Manual Therapy  Taping    Manual Lymphatic Drainage (MLD)  in sitting short neck, diaphragmatic breathing, bil shoulder collectors, axillary and pectoral nodes, bil upper quadrants and then stationary circles at lateral and anterior neck directing fluid down and avoiding radiation areas, bilateral cheeks to posterior neck,      Kinesiotex  Edema      Kinesiotix   Edema  skin kote applied to right face, then kinesiotape with 4 pronged fan shape from full areas on cheek back to posterior neck . Pt instructed to remove at any sign of discomfort              PT Education - 06/27/17 1800    Education provided  Yes    Education Details  remove kinesiotape at sign of discomfort with oil on a cotton ball     Person(s) Educated  Patient    Methods  Explanation;Handout    Comprehension  Verbalized understanding       PT Short Term Goals - 06/27/17 1313      PT SHORT TERM GOAL #1   Title  Pt will be independent in self manual lymph draiange and use of compression to manage neck lymphedema     Baseline  pt is able to do it at home, but gets more effect from the treatment here     Time  8    Period  Weeks    Status  On-going  PT SHORT  TERM GOAL #2   Title  Pt will be independent in a home exercise program for strength of UE and LE  and neck range of motion     Status  Achieved      PT SHORT TERM GOAL #3   Title  Pt will increase active range of motion of right atm to 130 indicating increase in UE strength so that she can perfom household chores easier     Baseline  150    Status  Achieved      PT SHORT TERM GOAL #4   Title  Pt will report a decrease in overall pain by 25%    Status  Achieved      PT SHORT TERM GOAL #5   Title  Pt will increase right grip strength by an average of 10#     Status  Achieved        PT Long Term Goals - 06/27/17 1315      PT LONG TERM GOAL #1   Title  Pt will decrease quick DASH score to less than 38 indicating a functional improvment in left arm use     Baseline  63.64 on eval,  31.82 on 06/27/2017    Status  Achieved      PT LONG TERM GOAL #2   Title  Pt will increase 30 second sit to stand by 2 repetitions showing improvment in functional strength    Status  Achieved      PT LONG TERM GOAL #3   Title  pt will decrease TUG score by 2 seconds indicating an improvement in functional gait and balance.     Baseline  6.91 sec on eval 5.79 sec on 06/27/2017     Status  Partially Met      PT LONG TERM GOAL #4   Title  Pt will report a decrease in pain by 50%     Status  Achieved      PT LONG TERM GOAL #5   Title  Pt will increase the strength of right shoudler flexion and abduction to 4/5     Time  8    Period  Weeks    Status  New            Plan - 06/27/17 1801    Clinical Impression Statement  Nancy [name] has made significant improvments and has met many of her short and long term goals.  She still has pain and swelling in her face and mouth, especailly in her right cheek.  Tried kinesiotape today to see if that will help her and she continues to get relief for a few days with manua lymph drainage technique.  Have asked for recert today as she wants to continue for more work on  lymphedma as well as continued shoulder strengthening and instruction in a more comprehensive home exercise program prior to discharge     Rehab Potential  Good    Clinical Impairments Affecting Rehab Potential  extensive surgery ; completed radiation 06/18/17    PT Frequency  2x / week    PT Duration  8 weeks    PT Treatment/Interventions  ADLs/Self Care Home Management;Therapeutic activities;Therapeutic exercise;Taping;Manual techniques;Manual lymph drainage;Compression bandaging;Passive range of motion;Scar mobilization;Patient/family education;Functional mobility training;DME Instruction;Balance training;Neuromuscular re-education;Gait training;Stair training    PT Next Visit Plan  Assess effect of kinesiotape.  Continue with MLD to face and neck,  Progress strengthening to right scapula and shoulder continue upgrade to HEP  Consulted and Agree with Plan of Care  Patient       Patient will benefit from skilled therapeutic intervention in order to improve the following deficits and impairments:  Abnormal gait, Decreased endurance, Decreased skin integrity, Impaired sensation, Increased edema, Decreased scar mobility, Decreased knowledge of precautions, Decreased activity tolerance, Decreased knowledge of use of DME, Decreased strength, Increased fascial restricitons, Impaired UE functional use, Pain, Difficulty walking, Decreased mobility, Decreased balance, Decreased range of motion, Impaired perceived functional ability, Postural dysfunction  Visit Diagnosis: Aftercare following surgery for neoplasm - Plan: PT plan of care cert/re-cert  Lymphedema, not elsewhere classified - Plan: PT plan of care cert/re-cert  Acute pain of right shoulder - Plan: PT plan of care cert/re-cert  Muscle weakness (generalized) - Plan: PT plan of care cert/re-cert     Problem List Patient Active Problem List   Diagnosis Date Noted  . 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 Squamous  cell carcinoma of floor of mouth ([place]) 04/23/2017  . Squamous cell carcinoma of tongue ([place]) 04/23/2017   Donato Heinz. Owens Shark PT  [place] 06/27/2017, 6:10 PM  [place], [place], [address] Phone: [phone]   Fax:  [phone]  Name: Nancy [name] MRN: [id number] Date of Birth: 1961/03/13

## 2017-06-27 NOTE — Patient Instructions (Signed)
Kinesiotape should stay on your body for a few days.  It's OK to shower with it on.  Just pat it dry afterwards.    While the tape is on, keep an eye on the skin around it.  If you feel any itching, see any redness or feel in any way that the tape is irritating your skin, it is time to gently remove it.   To loosen the tape from your skin, use something oily like olive oil or baby oil on a cotton ball.  Gently roll the edge of the tape away from the skin.  Then, hold the edge of the tape away from the skin and gently press the skin away from the tape.  Do not pull the tape off the skin or you may cause skin irritation.  Then, inspect your skin.  Gently cleanse with soap and water  

## 2017-06-28 ENCOUNTER — Ambulatory Visit: Payer: Medicare Other | Admitting: Physical Therapy

## 2017-06-28 ENCOUNTER — Encounter: Payer: Self-pay | Admitting: Physical Therapy

## 2017-06-28 DIAGNOSIS — M6281 Muscle weakness (generalized): Secondary | ICD-10-CM

## 2017-06-28 DIAGNOSIS — M25511 Pain in right shoulder: Secondary | ICD-10-CM

## 2017-06-28 DIAGNOSIS — I890 Lymphedema, not elsewhere classified: Secondary | ICD-10-CM

## 2017-06-28 DIAGNOSIS — Z483 Aftercare following surgery for neoplasm: Secondary | ICD-10-CM

## 2017-06-28 NOTE — Therapy (Signed)
[place], [place], [address] Phone: [phone]   Fax:  [phone]  Physical Therapy Treatment  Patient Details  Name: Nancy [name] MRN: [id number] Date of Birth: 06-01-1960 Referring Provider: Dr. Colin Benton    Encounter Date: 06/28/2017  PT End of Session - 06/28/17 1206    Visit Number  20    Number of Visits  35    Date for PT Re-Evaluation  08/25/16    PT Start Time  5809    PT Stop Time  1100    PT Time Calculation (min)  45 min       Past Medical History:  Diagnosis Date  . Bipolar 1 disorder ([place])   . Cervical cancer ([place])   . GERD (gastroesophageal reflux disease)   . History of tracheostomy 03/04/2017   She had trach placed during surgery and reversed before discharge [facility])  . Hypertension   . Hypothyroid   . Manic depressive disorder ([place])   . Tongue cancer [facility])     Past Surgical History:  Procedure Laterality Date  . bone-skin graft     free osteocutaneous flap with microvascular anastomosis: other than iliac crest, metatarsal or great toe.   . cervical lymphadenectomy  03/04/2017   (modified radical neck dissection) [facility].   . EXCISION OF TONGUE LESION Right 03/04/2017   PR excis tongue, mouth, jaw. [facility])  . exploration, carotid  Left 03/04/2017   [facility].   . GLOSSECTOMY  03/04/2017   Glossectomy; composite WO Radical neck dissect [facility])  . LARYNGOSCOPY  11/22/2016   Laryngoscopy, direct, operative with biopsy, [place] [facility]  03/04/2017   Reconstruct mandible/ maxil subperiosteal implant, [facility]  . PARTIAL GLOSSECTOMY    . SKIN FULL THICKNESS GRAFT  03/04/2017   [place]    . VAGINAL HYSTERECTOMY      There were no vitals filed for this visit.  Subjective Assessment - 06/28/17 1021    Subjective  Pt comes in today still wearing the kinesiotape since yesterday.   She is having pain today,and may need to take some Advie.  Its hard to say if the kinesiotape is helping  but she felt better yesterday     Pertinent History  chemo from july til sept 28. Oct 15 surgery for neck dissection, removal tongue and jaw with reconsturction with artery, skin and fibula from left leg. She has skin graft on left leg and has pain and swelling in leg.  She had physical therapy at home up until last week and is still getting home health RN for wound care  they took 27 lymph nodes and she has had swelling and decreased range of motion of her nedk and right arm     Patient Stated Goals  to get full range of motion of neck and shoulder and get rid of pain in leg and get help with swelling in her neck     Currently in Pain?  Yes    Pain Score  -- did not rate     Pain Location  Face    Pain Orientation  Left    Pain Descriptors / Indicators  Sore    Pain Type  Acute pain    Pain Onset  In the past 7 days                      [facility] Adult  PT Treatment/Exercise - 06/28/17 0001      Manual Therapy   Manual therapy comments  ktape gently removed and skin checked.  not open areas temoporary redness in right cheek and neck at one spot     Manual Lymphatic Drainage (MLD)  in sitting short neck, diaphragmatic breathing, bil shoulder collectors, axillary and pectoral nodes, bil upper quadrants and then stationary circles at lateral and anterior neck directing fluid down and avoiding radiation areas, bilateral cheeks to posterior neck,               PT Education - 06/27/17 1800    Education provided  Yes    Education Details  remove kinesiotape at sign of discomfort with oil on a cotton ball     Person(s) Educated  Patient    Methods  Explanation;Handout    Comprehension  Verbalized understanding       PT Short Term Goals - 06/27/17 1313      PT SHORT TERM GOAL #1   Title  Pt will be independent in self manual lymph draiange and use of compression to manage  neck lymphedema     Baseline  pt is able to do it at home, but gets more effect from the treatment here     Time  8    Period  Weeks    Status  On-going      PT SHORT TERM GOAL #2   Title  Pt will be independent in a home exercise program for strength of UE and LE  and neck range of motion     Status  Achieved      PT SHORT TERM GOAL #3   Title  Pt will increase active range of motion of right atm to 130 indicating increase in UE strength so that she can perfom household chores easier     Baseline  150    Status  Achieved      PT SHORT TERM GOAL #4   Title  Pt will report a decrease in overall pain by 25%    Status  Achieved      PT SHORT TERM GOAL #5   Title  Pt will increase right grip strength by an average of 10#     Status  Achieved        PT Long Term Goals - 06/27/17 1315      PT LONG TERM GOAL #1   Title  Pt will decrease quick DASH score to less than 38 indicating a functional improvment in left arm use     Baseline  63.64 on eval,  31.82 on 06/27/2017    Status  Achieved      PT LONG TERM GOAL #2   Title  Pt will increase 30 second sit to stand by 2 repetitions showing improvment in functional strength    Status  Achieved      PT LONG TERM GOAL #3   Title  pt will decrease TUG score by 2 seconds indicating an improvement in functional gait and balance.     Baseline  6.91 sec on eval 5.79 sec on 06/27/2017     Status  Partially Met      PT LONG TERM GOAL #4   Title  Pt will report a decrease in pain by 50%     Status  Achieved      PT LONG TERM GOAL #5   Title  Pt will increase the strength of right shoudler flexion and abduction to  4/5     Time  8    Period  Weeks    Status  New            Plan - 06/28/17 1206    Clinical Impression Statement  No lymphedema perceived in her neck after use of kinesiotape, but pt still has visible fullness in her right cheek and feels that she has some sores in her mouth.  she felt better after the MLD but still thinks  she will need a pain pill     Clinical Impairments Affecting Rehab Potential  extensive surgery ; completed radiation 06/18/17    PT Frequency  2x / week    PT Duration  8 weeks    PT Next Visit Plan    Continue with MLD to face and neck, use kinesiotape PRN Progress strengthening to right scapula and shoulder continue upgrade to HEP     Consulted and Agree with Plan of Care  Patient       Patient will benefit from skilled therapeutic intervention in order to improve the following deficits and impairments:     Visit Diagnosis: Aftercare following surgery for neoplasm  Lymphedema, not elsewhere classified  Acute pain of right shoulder  Muscle weakness (generalized)     Problem List Patient Active Problem List   Diagnosis Date Noted  . 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 Squamous cell carcinoma of tongue ([place]) 04/23/2017   Donato Heinz. Owens Shark PT  Norwood Levo 06/28/2017, 12:09 PM  [place], [place], [address] Phone: [phone]   Fax:  [phone]  Name: Nancy [name] MRN: [id number] Date of Birth: 1961/03/21

## 2017-07-02 ENCOUNTER — Ambulatory Visit: Payer: Medicare Other

## 2017-07-02 DIAGNOSIS — Z483 Aftercare following surgery for neoplasm: Secondary | ICD-10-CM | POA: Diagnosis not present

## 2017-07-02 DIAGNOSIS — I890 Lymphedema, not elsewhere classified: Secondary | ICD-10-CM

## 2017-07-02 NOTE — Therapy (Signed)
[place], [place], [address] Phone: [phone]   Fax:  [phone]  Physical Therapy Treatment  Patient Details  Name: Nancy [name] MRN: [id number] Date of Birth: 18-Mar-1961 Referring Provider: Dr. Colin Benton    Encounter Date: 07/02/2017  PT End of Session - 07/02/17 1019    Visit Number  21    Number of Visits  35    Date for PT Re-Evaluation  08/25/16    PT Start Time  0937    PT Stop Time  1016    PT Time Calculation (min)  39 min    Activity Tolerance  Patient tolerated treatment well    Behavior During Therapy  [facility] for tasks assessed/performed       Past Medical History:  Diagnosis Date  . Bipolar 1 disorder ([place])   . Cervical cancer ([place])   . GERD (gastroesophageal reflux disease)   . History of tracheostomy 03/04/2017   She had trach placed during surgery and reversed before discharge [facility])  . Hypertension   . Hypothyroid   . Manic depressive disorder ([place])   . Tongue cancer [facility])     Past Surgical History:  Procedure Laterality Date  . bone-skin graft     free osteocutaneous flap with microvascular anastomosis: other than iliac crest, metatarsal or great toe.   . cervical lymphadenectomy  03/04/2017   (modified radical neck dissection) [facility].   . EXCISION OF TONGUE LESION Right 03/04/2017   PR excis tongue, mouth, jaw. [facility])  . exploration, carotid  Left 03/04/2017   [facility].   . GLOSSECTOMY  03/04/2017   Glossectomy; composite WO Radical neck dissect [facility])  . LARYNGOSCOPY  11/22/2016   Laryngoscopy, direct, operative with biopsy, [place]  03/04/2017   Reconstruct mandible/ maxil subperiosteal implant, [facility]  . PARTIAL GLOSSECTOMY    . SKIN FULL THICKNESS GRAFT  03/04/2017   [place]    . VAGINAL HYSTERECTOMY      There were no vitals filed for this  visit.  Subjective Assessment - 07/02/17 0940    Subjective  I fell asleep in my chair so that pain in my Lt upper quadrant came back on me, it's just sore now but it woke me up last night. I also painted my room yesterday and I'm Lt handed so that probably didn't help either. My Rt cheek is still really swollen, especially on the inside, but other than that I feel good.     Pertinent History  chemo from july til sept 28. Oct 15 surgery for neck dissection, removal tongue and jaw with reconsturction with artery, skin and fibula from left leg. She has skin graft on left leg and has pain and swelling in leg.  She had physical therapy at home up until last week and is still getting home health RN for wound care  they took 28 lymph nodes and she has had swelling and decreased range of motion of her nedk and right arm     Patient Stated Goals  to get full range of motion of neck and shoulder and get rid of pain in leg and get help with swelling in her neck     Currently in Pain?  No/denies                      [facility] Adult PT Treatment/Exercise - 07/02/17 0001  Manual Therapy   Manual Lymphatic Drainage (MLD)  In Supine: short neck, diaphragmatic breathing, bil shoulder collectors, axillary and pectoral nodes, bil upper quadrants and then stationary circles at lateral and anterior neck directing fluid down, bilateral cheeks to posterior neck,  towards lateral nec pathways directing towards bil axillae               PT Short Term Goals - 06/27/17 1313      PT SHORT TERM GOAL #1   Title  Pt will be independent in self manual lymph draiange and use of compression to manage neck lymphedema     Baseline  pt is able to do it at home, but gets more effect from the treatment here     Time  8    Period  Weeks    Status  On-going      PT SHORT TERM GOAL #2   Title  Pt will be independent in a home exercise program for strength of UE and LE  and neck range of motion     Status   Achieved      PT SHORT TERM GOAL #3   Title  Pt will increase active range of motion of right atm to 130 indicating increase in UE strength so that she can perfom household chores easier     Baseline  150    Status  Achieved      PT SHORT TERM GOAL #4   Title  Pt will report a decrease in overall pain by 25%    Status  Achieved      PT SHORT TERM GOAL #5   Title  Pt will increase right grip strength by an average of 10#     Status  Achieved        PT Long Term Goals - 06/27/17 1315      PT LONG TERM GOAL #1   Title  Pt will decrease quick DASH score to less than 38 indicating a functional improvment in left arm use     Baseline  63.64 on eval,  31.82 on 06/27/2017    Status  Achieved      PT LONG TERM GOAL #2   Title  Pt will increase 30 second sit to stand by 2 repetitions showing improvment in functional strength    Status  Achieved      PT LONG TERM GOAL #3   Title  pt will decrease TUG score by 2 seconds indicating an improvement in functional gait and balance.     Baseline  6.91 sec on eval 5.79 sec on 06/27/2017     Status  Partially Met      PT LONG TERM GOAL #4   Title  Pt will report a decrease in pain by 50%     Status  Achieved      PT LONG TERM GOAL #5   Title  Pt will increase the strength of right shoudler flexion and abduction to 4/5     Time  8    Period  Weeks    Status  New            Plan - 07/02/17 1019    Clinical Impression Statement  Focused on manual lymph drainage to Rt cheek today as pt reports feeling most swelling here and this is visible and palpable. Offered interoral MLD as well but pt reported still feeling so tender as inside of cheek she did not want to try this today. Pt reported  tightness some better at end of treatment but normally feels most relief later after appointment .     Rehab Potential  Good    Clinical Impairments Affecting Rehab Potential  extensive surgery ; completed radiation 06/18/17    PT Frequency  2x / week     PT Duration  8 weeks    PT Treatment/Interventions  ADLs/Self Care Home Management;Therapeutic activities;Therapeutic exercise;Taping;Manual techniques;Manual lymph drainage;Compression bandaging;Passive range of motion;Scar mobilization;Patient/family education;Functional mobility training;DME Instruction;Balance training;Neuromuscular re-education;Gait training;Stair training    PT Next Visit Plan    Continue with MLD to face and neck, use kinesiotape PRN Progress strengthening to right scapula and shoulder continue upgrade to HEP     Consulted and Agree with Plan of Care  Patient       Patient will benefit from skilled therapeutic intervention in order to improve the following deficits and impairments:  Abnormal gait, Decreased endurance, Decreased skin integrity, Impaired sensation, Increased edema, Decreased scar mobility, Decreased knowledge of precautions, Decreased activity tolerance, Decreased knowledge of use of DME, Decreased strength, Increased fascial restricitons, Impaired UE functional use, Pain, Difficulty walking, Decreased mobility, Decreased balance, Decreased range of motion, Impaired perceived functional ability, Postural dysfunction  Visit Diagnosis: Aftercare following surgery for neoplasm  Lymphedema, not elsewhere classified     Problem List Patient Active Problem List   Diagnosis Date Noted  . 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 Squamous cell carcinoma of tongue ([facility]) 04/23/2017    Otelia Limes, PTA 07/02/2017, 10:21 AM  [place], [place], [address] Phone: [phone]   Fax:  [phone]  Name: SALLI BODIN MRN: [id number] Date of Birth: 28-Apr-1961

## 2017-07-04 ENCOUNTER — Encounter: Payer: Self-pay | Admitting: Physical Therapy

## 2017-07-04 NOTE — Progress Notes (Signed)
Ms. Asmus presents for follow up of radiation completed 06/18/17 to her head and neck/ Tongue.   Pain issues, if any: She denies. She does report pain when eating to her throat to one spot to the right side of her throat.  Using a feeding tube?: No Weight changes, if any:  Wt Readings from Last 3 Encounters:  07/09/17 168 lb 3.2 oz (76.3 kg)  07/09/17 169 lb 4 oz (76.8 kg)  06/17/17 172 lb 6.4 oz (78.2 kg)   Swallowing issues, if any: She reports pain when swallowing to the right side of her throat. She does not modify her diet. She is able to eat sandwiches, crackers, and meats. She continues to supplement with ensure mixed with ice cream.  Smoking or chewing tobacco? No Using fluoride trays daily? Yes Last ENT visit was on: Not since diagnosis Other notable issues, if any:  She reports dryness, especially in the morning to her mouth and throat.  Appointments with PT for Lymphedema 07/05/17, 07/09/17... Appointment with Garald Balding 07/08/17 Appointment with nutrition 07/09/17. Appointment with Dr. Enrique Sack 07/23/17  She also had an appointment with Dentistry at [facility] on 07/04/17.   BP (!) 131/91   Pulse 69   Temp 98.5 F (36.9 C)   Ht 5\' 5"  (1.651 m)   Wt 168 lb 3.2 oz (76.3 kg)   SpO2 98% Comment: room air  BMI 27.99 kg/m

## 2017-07-05 ENCOUNTER — Ambulatory Visit: Payer: Medicare Other | Admitting: Physical Therapy

## 2017-07-08 ENCOUNTER — Ambulatory Visit: Payer: Medicare Other

## 2017-07-08 DIAGNOSIS — R1312 Dysphagia, oropharyngeal phase: Secondary | ICD-10-CM

## 2017-07-08 DIAGNOSIS — Z483 Aftercare following surgery for neoplasm: Secondary | ICD-10-CM | POA: Diagnosis not present

## 2017-07-08 MED FILL — LIDOCAINE 2% VISCOUS SOLN: 2 | 15 days supply | Qty: 300 | Fill #1

## 2017-07-08 NOTE — Therapy (Signed)
[place] [address] [place], [place], [address] Phone: [phone]   Fax:  [phone]  Speech Language Pathology Treatment  Patient Details  Name: Nancy [name] MRN: [id number] Date of Birth: 1960/08/02 Referring Provider: Eppie Gibson, MD   Encounter Date: 07/08/2017  End of Session - 07/08/17 1112    Visit Number  3    Number of Visits  5    Date for SLP Re-Evaluation  09/12/17    SLP Start Time  2423    SLP Stop Time   1055    SLP Time Calculation (min)  32 min    Activity Tolerance  Patient tolerated treatment well       Past Medical History:  Diagnosis Date  . Bipolar 1 disorder ([place])   . Cervical cancer ([place])   . GERD (gastroesophageal reflux disease)   . History of tracheostomy 03/04/2017   She had trach placed during surgery and reversed before discharge [facility])  . Hypertension   . Hypothyroid   . Manic depressive disorder ([place])   . Tongue cancer [facility])     Past Surgical History:  Procedure Laterality Date  . bone-skin graft     free osteocutaneous flap with microvascular anastomosis: other than iliac crest, metatarsal or great toe.   . cervical lymphadenectomy  03/04/2017   (modified radical neck dissection) [facility].   . EXCISION OF TONGUE LESION Right 03/04/2017   PR excis tongue, mouth, jaw. [facility])  . exploration, carotid  Left 03/04/2017   [facility].   . GLOSSECTOMY  03/04/2017   Glossectomy; composite WO Radical neck dissect [facility])  . LARYNGOSCOPY  11/22/2016   Laryngoscopy, direct, operative with biopsy, [place]  03/04/2017   Reconstruct mandible/ maxil [facility]per[facility]osteal implant, [facility]  . PARTIAL GLOSSECTOMY    . SKIN FULL THICKNESS GRAFT  03/04/2017   [place]    . VAGINAL HYSTERECTOMY      There were no vitals filed for this visit.  Subjective Assessment - 07/08/17 1026    Subjective  "I got a [place] sandwich down." Tried steak but throat sill too raw.            ADULT SLP TREATMENT - 07/08/17 1030      General Information   Behavior/Cognition  Alert;Cooperative;Pleasant mood      Treatment Provided   Treatment provided  Dysphagia      Dysphagia Treatment   Temperature Spikes Noted  No    Respiratory Status  Room air    Oral Cavity - Dentition  Missing dentition    Treatment Methods  Skilled observation;Therapeutic exercise;Patient/caregiver education    Patient observed directly with PO's  Yes    Type of PO's observed  Dysphagia 3 (soft);Thin liquids    Liquids provided via  Cup    Oral Phase Signs & Symptoms  Prolonged bolus formation    Pharyngeal Phase Signs & Symptoms  -- none noted    Other treatment/comments  Pt reports a small area of soreness lateral rt neck (internal). Pt requires min A occasionally for HEP.       Assessment / Recommendations / Plan   Plan  Continue with current plan of care      Dysphagia Recommendations   Diet recommendations  Dysphagia 3 (mechanical soft);Thin liquid    Liquids provided via  Cup    Medication Administration  Whole meds with liquid  Progression Toward Goals   Progression toward goals  Progressing toward goals         SLP Short Term Goals - 06/10/17 1014      SLP SHORT TERM GOAL #1   Title  pt will complete HEP with rare min A    Status  Achieved      SLP SHORT TERM GOAL #2   Title  pt will tell SLP why she is completing HEP     Status  Achieved      SLP SHORT TERM GOAL #3   Title  pt will follow compensations of lt-sided mastication and lt-sided oral transit with POs    Status  Achieved      SLP SHORT TERM GOAL #4   Title  pt will tell SLP 3 overt s/s aspiration PNA with modified independence    Status  Achieved       SLP Long Term Goals - 07/08/17 1116      SLP LONG TERM GOAL #1   Title  pt will complete HEP with modified independence     Baseline  LTGs renewed  07-08-17    Time  2    Period  -- visits    Status  Not Met and ongoing      SLP LONG TERM GOAL #2   Title  pt will tell how a food journal can expedite return to more normalized diet    Time  2    Period  -- visits    Status  Not Met and ongoing      SLP LONG TERM GOAL #3   Title  pt will tell SLP 3 overt s/s aspiration PNA over 2 visits    Baseline  06-10-17    Time  2    Period  -- visits    Status  Partially Met and ongoing       Plan - 07/08/17 1112    Clinical Impression Statement  Pt with oropharyngeal swallowing continues essentially WNL with soft foods. She reports a wider variety of foods consumed and now can take meds with H2O. No overt s/s aspiration today, however pt admits to suboptimal compliance with HEP. SLP reminded pt of non-swallowing exercises on the HEP can be done on days of high pain in rt neck/throat, or take lidocaine for throat pain prior to HEP.  The probability of further swallowing difficulty increases dramatically with the initiation of radiation therapy. Pt will need to cont to be followed by SLP for regular assessment of accurate HEP completion as well as for safety with POs both during and following treatment/s.    Speech Therapy Frequency  -- approx once every 4 weeks    Duration  -- 5 total visit    Treatment/Interventions  Aspiration precaution training;Pharyngeal strengthening exercises;Diet toleration management by SLP;Trials of upgraded texture/liquids;Internal/external aids;Patient/family education;Compensatory strategies;SLP instruction and feedback;Environmental controls       Patient will benefit from skilled therapeutic intervention in order to improve the following deficits and impairments:   Oropharyngeal dysphagia    Problem List Patient Active Problem List   Diagnosis Date Noted  . 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 Squamous cell carcinoma of  tongue ([place]) 04/23/2017    Latanja Lehenbauer ,MS, [place]  07/08/2017, 11:18 AM  [place] [address] [place], [place], [address] Phone: [phone]   Fax:  [phone]   Name:  Nancy [name] MRN: [id number] Date of Birth: [birth year]

## 2017-07-09 ENCOUNTER — Ambulatory Visit: Payer: Medicare Other

## 2017-07-09 ENCOUNTER — Other Ambulatory Visit: Payer: Self-pay | Admitting: Radiation Oncology

## 2017-07-09 ENCOUNTER — Inpatient Hospital Stay: Payer: Medicare Other | Attending: Radiation Oncology | Admitting: Nutrition

## 2017-07-09 ENCOUNTER — Ambulatory Visit
Admission: RE | Admit: 2017-07-09 | Discharge: 2017-07-09 | Disposition: A | Discharge status: Home / Self Care | Payer: Medicare Other | Source: Ambulatory Visit | Attending: Radiation Oncology | Admitting: Radiation Oncology

## 2017-07-09 ENCOUNTER — Encounter: Payer: Self-pay | Admitting: Radiation Oncology

## 2017-07-09 VITALS — BP 131/91 | HR 69 | Temp 98.5°F | Ht 65.0 in | Wt 168.2 lb

## 2017-07-09 DIAGNOSIS — Z923 Personal history of irradiation: Secondary | ICD-10-CM | POA: Insufficient documentation

## 2017-07-09 DIAGNOSIS — C021 Malignant neoplasm of border of tongue: Secondary | ICD-10-CM

## 2017-07-09 DIAGNOSIS — Z791 Long term (current) use of non-steroidal anti-inflammatories (NSAID): Secondary | ICD-10-CM | POA: Insufficient documentation

## 2017-07-09 DIAGNOSIS — Z483 Aftercare following surgery for neoplasm: Secondary | ICD-10-CM | POA: Diagnosis not present

## 2017-07-09 DIAGNOSIS — K1230 Oral mucositis (ulcerative), unspecified: Secondary | ICD-10-CM | POA: Insufficient documentation

## 2017-07-09 DIAGNOSIS — Z91041 Radiographic dye allergy status: Secondary | ICD-10-CM | POA: Diagnosis not present

## 2017-07-09 DIAGNOSIS — Z880 Allergy status to penicillin: Secondary | ICD-10-CM | POA: Insufficient documentation

## 2017-07-09 DIAGNOSIS — I890 Lymphedema, not elsewhere classified: Secondary | ICD-10-CM

## 2017-07-09 DIAGNOSIS — Z79899 Other long term (current) drug therapy: Secondary | ICD-10-CM | POA: Diagnosis not present

## 2017-07-09 DIAGNOSIS — R070 Pain in throat: Secondary | ICD-10-CM | POA: Diagnosis not present

## 2017-07-09 MED FILL — FLUCONAZOLE 100 MG TABLET: 100 | 3 days supply | Qty: 4 | Fill #0

## 2017-07-09 NOTE — Therapy (Signed)
[place], [place], [address] Phone: [phone]   Fax:  [phone]  Physical Therapy Treatment  Patient Details  Name: Nancy [name] MRN: [id number] Date of Birth: 04/07/1961 Referring Provider: Dr. Colin Benton    Encounter Date: 07/09/2017  PT End of Session - 07/09/17 1212    Visit Number  22    Number of Visits  35    Date for PT Re-Evaluation  08/25/16    PT Start Time  1110    PT Stop Time  1156    PT Time Calculation (min)  46 min    Activity Tolerance  Patient tolerated treatment well    Behavior During Therapy  [facility] for tasks assessed/performed       Past Medical History:  Diagnosis Date  . Bipolar 1 disorder ([place])   . Cervical cancer ([place])   . GERD (gastroesophageal reflux disease)   . History of tracheostomy 03/04/2017   She had trach placed during surgery and reversed before discharge [facility])  . Hypertension   . Hypothyroid   . Manic depressive disorder ([place])   . Tongue cancer [facility])     Past Surgical History:  Procedure Laterality Date  . bone-skin graft     free osteocutaneous flap with microvascular anastomosis: other than iliac crest, metatarsal or great toe.   . cervical lymphadenectomy  03/04/2017   (modified radical neck dissection) [facility].   . EXCISION OF TONGUE LESION Right 03/04/2017   PR excis tongue, mouth, jaw. [facility])  . exploration, carotid  Left 03/04/2017   [facility].   . GLOSSECTOMY  03/04/2017   Glossectomy; composite WO Radical neck dissect [facility])  . LARYNGOSCOPY  11/22/2016   Laryngoscopy, direct, operative with biopsy, [place]  03/04/2017   Reconstruct mandible/ maxil subperiosteal implant, [facility]  . PARTIAL GLOSSECTOMY    . SKIN FULL THICKNESS GRAFT  03/04/2017   [place]    . VAGINAL HYSTERECTOMY      There were no vitals filed for this  visit.  Subjective Assessment - 07/09/17 1113    Subjective  The sensitivity in my cheek is slowly starting to improve. I even had the lady in the drive thru where I go alot tell me she noticed my cheek looked better!    Pertinent History  chemo from july til sept 28. Oct 15 surgery for neck dissection, removal tongue and jaw with reconsturction with artery, skin and fibula from left leg. She has skin graft on left leg and has pain and swelling in leg.  She had physical therapy at home up until last week and is still getting home health RN for wound care  they took 52 lymph nodes and she has had swelling and decreased range of motion of her nedk and right arm     Patient Stated Goals  to get full range of motion of neck and shoulder and get rid of pain in leg and get help with swelling in her neck     Currently in Pain?  No/denies                      [facility] Adult PT Treatment/Exercise - 07/09/17 0001      Manual Therapy   Manual Lymphatic Drainage (MLD)  In Supine: short neck, diaphragmatic breathing, bil shoulder collectors, axillary and pectoral nodes, bil upper quadrants and then stationary circles at  lateral and anterior neck directing fluid down, bilateral cheeks to posterior neck,  towards lateral nec pathways directing towards bil axillae      Kinesiotix   Edema  skin kote applied to Rt neck then kinesiotape with 2 finger fan to Rt lateral neck to retroaurical and slightly anterior to that to posterior neck.                PT Short Term Goals - 06/27/17 1313      PT SHORT TERM GOAL #1   Title  Pt will be independent in self manual lymph draiange and use of compression to manage neck lymphedema     Baseline  pt is able to do it at home, but gets more effect from the treatment here     Time  8    Period  Weeks    Status  On-going      PT SHORT TERM GOAL #2   Title  Pt will be independent in a home exercise program for strength of UE and LE  and neck range of  motion     Status  Achieved      PT SHORT TERM GOAL #3   Title  Pt will increase active range of motion of right atm to 130 indicating increase in UE strength so that she can perfom household chores easier     Baseline  150    Status  Achieved      PT SHORT TERM GOAL #4   Title  Pt will report a decrease in overall pain by 25%    Status  Achieved      PT SHORT TERM GOAL #5   Title  Pt will increase right grip strength by an average of 10#     Status  Achieved        PT Long Term Goals - 06/27/17 1315      PT LONG TERM GOAL #1   Title  Pt will decrease quick DASH score to less than 38 indicating a functional improvment in left arm use     Baseline  63.64 on eval,  31.82 on 06/27/2017    Status  Achieved      PT LONG TERM GOAL #2   Title  Pt will increase 30 second sit to stand by 2 repetitions showing improvment in functional strength    Status  Achieved      PT LONG TERM GOAL #3   Title  pt will decrease TUG score by 2 seconds indicating an improvement in functional gait and balance.     Baseline  6.91 sec on eval 5.79 sec on 06/27/2017     Status  Partially Met      PT LONG TERM GOAL #4   Title  Pt will report a decrease in pain by 50%     Status  Achieved      PT LONG TERM GOAL #5   Title  Pt will increase the strength of right shoudler flexion and abduction to 4/5     Time  8    Period  Weeks    Status  New            Plan - 07/09/17 1212    Clinical Impression Statement  Continued with focus to lymphedema to Rt cheek as pt reports this, though improving, is slow to do so and still has tenderness at inner cheek. Also applied kinesiotape to Rt lateral neck as she tolerated this well when last done.  Pt continues to report this treatment very beneficial.    Rehab Potential  Good    Clinical Impairments Affecting Rehab Potential  extensive surgery ; completed radiation 06/18/17    PT Frequency  2x / week    PT Duration  8 weeks    PT Treatment/Interventions   ADLs/Self Care Home Management;Therapeutic activities;Therapeutic exercise;Taping;Manual techniques;Manual lymph drainage;Compression bandaging;Passive range of motion;Scar mobilization;Patient/family education;Functional mobility training;DME Instruction;Balance training;Neuromuscular re-education;Gait training;Stair training    PT Next Visit Plan  Try UBE and adding 3 way raises to HEP, then continue with MLD to face and neck, use kinesiotape PRN. Progress strengthening to right scapula.    Consulted and Agree with Plan of Care  Patient       Patient will benefit from skilled therapeutic intervention in order to improve the following deficits and impairments:  Abnormal gait, Decreased endurance, Decreased skin integrity, Impaired sensation, Increased edema, Decreased scar mobility, Decreased knowledge of precautions, Decreased activity tolerance, Decreased knowledge of use of DME, Decreased strength, Increased fascial restricitons, Impaired UE functional use, Pain, Difficulty walking, Decreased mobility, Decreased balance, Decreased range of motion, Impaired perceived functional ability, Postural dysfunction  Visit Diagnosis: Aftercare following surgery for neoplasm  Lymphedema, not elsewhere classified     Problem List Patient Active Problem List   Diagnosis Date Noted  . 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 Squamous cell carcinoma of tongue (HCC) 04/23/2017    Otelia Limes, PTA 07/09/2017, 12:16 PM  [place], [place], [address] Phone: [phone]   Fax:  [phone]  Name: Nancy [name] MRN: [id number] Date of Birth: Apr 14, 1961

## 2017-07-09 NOTE — Progress Notes (Signed)
Nutrition Follow-up:  Patient with tongue cancer.  She has completed radiation therapy on 1/29.    Met with patient and mother this pm in clinic.  Noted patient has been seeing SLP (yesterday f/u appt).  Reports that she has been working hard to maintain weight.  Reports she has bought a scale and has been weighing her self daily.  Reports that she is drinking 4-5 ensure plus per day. Reports ate 1/2 of turkey sandwich with Carl, SLP yesterday and tolerated well.  Patient reports eating hamburger and hot dog, vegetable-beef stew, ice cream.  Reports ate chicken and biscuit with gravy this am (100% of it).  Reports drinks water (64 oz/day), unsweet tea and ensure plus primarily without difficulty.    Patient reports bowels are moving on regular basis.  Is no longer taking pain medication except for tylenol as needed.    Medications: reviewed  Labs: reviewed  Anthropometrics:   169 lb 4 oz today measured in RD office, slight decrease from 172.4 pounds on 1/28.     NUTRITION DIAGNOSIS: Inadequate oral intake continues    INTERVENTION:   Encouraged patient to continue to drink ensure plus for added calories and protein.  Gave patient 3rd complimentary case of ensure plus today.  Discussed soft foods and ways to add calories and protein to help prevent further weight loss.      MONITORING, EVALUATION, GOAL: Patient will work to increase calories and protein to promote healing   NEXT VISIT: Monday, March 18th   B. , RD, LDN Registered Dietitian [phone] (pager)     

## 2017-07-09 NOTE — Progress Notes (Signed)
Radiation Oncology         [phone] ________________________________  Name: Nancy [name] MRN: [id number]  Date: 07/09/2017  DOB: May 01, 1961  Follow-Up Visit Note  CC: Patient, No Pcp Per  Colin Benton, MD  Diagnosis and Prior Radiotherapy:       ICD-10-CM   1. 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 She reports throat pain to the right side of her throat upon eating but this is slightly better.  She has not modified her diet and is able to eat sandwiches, crackers, and meat. She continues to endorse ensure mixed with ice cream. Taste improving a bit.            ALLERGIES:  is allergic to amoxicillin and iodinated diagnostic agents.  Meds: Current Outpatient Medications  Medication Sig Dispense Refill  . acetaminophen (TYLENOL) 500 MG tablet Take 1,000 mg by mouth every 6 (six) hours as needed for mild pain.     . chlorthalidone (HYGROTON) 25 MG tablet Take 25 mg by mouth daily.     [place] DEXILANT 60 MG capsule Take 60 mg by mouth daily.     [place] Kitchen ibuprofen (ADVIL,MOTRIN) 200 MG tablet Take 400 mg by mouth every 6 (six) hours as needed for mild pain.     [place] Kitchen lamoTRIgine (LAMICTAL) 150 MG tablet Take 300 mg by mouth daily.    [place] Kitchen levothyroxine (SYNTHROID, LEVOTHROID) 25 MCG tablet Take 25 mcg by mouth daily before breakfast.     . lidocaine (XYLOCAINE) 2 % solution Patient: Mix 1part 2% viscous lidocaine, 1part H20. Swish & swallow 38mL of  diluted mixture, 67min before meals and at bedtime, up to QID 300 mL 2  . losartan (COZAAR) 100 MG tablet Take 100 mg by mouth daily.     [place] Kitchen PARoxetine (PAXIL) 40 MG tablet Take 40 mg by mouth daily.    . sodium fluoride (FLUORISHIELD) 1.1 % GEL dental gel Instill 1 drop of gel per tooth space of fluoride tray.  Place over teeth for 5 minutes.  Remove.  Spit out excess.  Repeat nightly. 120 mL prn  . fentaNYL (DURAGESIC - DOSED MCG/HR) 25 MCG/HR patch Place 1 patch (25 mcg total) onto the skin every 3 (three) days. (Patient not taking: Reported on 06/18/2017) 5 patch 0  . gabapentin (NEURONTIN) 800 MG tablet Take 1 tablet (800 mg total) by mouth 3 (three) times daily. (Patient not taking: Reported on 07/09/2017) 63 tablet 0  . LORazepam (ATIVAN) 0.5 MG tablet TAKE 1 TABLET BY MOUTH EVERY DAY AS NEEDED FOR ANXIETY  0  . magic mouthwash w/lidocaine SOLN Take 5 mLs by mouth 4 (four) times daily as needed for mouth pain. (Patient not taking: Reported on 07/09/2017) 200 mL 0  . ondansetron (ZOFRAN) 8 MG tablet Take 1 tablet (8 mg total) by mouth every 8 (eight) hours as needed for nausea or vomiting. (Patient not taking: Reported on 07/09/2017) 30 tablet 2  .  sucralfate (CARAFATE) 1 g tablet Dissolve 1 tablet in 10 mL H20 and swallow 30 min prior to meals and bedtime. (Patient not taking: Reported on 07/09/2017) 48 tablet 5   No current facility-administered medications for this encounter.     Physical Findings: The patient is in no acute distress. Patient is alert and oriented. Wt Readings from Last 3 Encounters:  07/09/17 168 lb 3.2 oz (76.3 kg)  07/09/17 169 lb 4 oz (76.8 kg)  06/17/17 172 lb 6.4 oz (78.2 kg)    height is 5\' 5"  (1.651 m) and weight is 168 lb 3.2 oz (76.3 kg). Her temperature is 98.5 F (36.9 C). Her blood pressure is 131/91 (abnormal) and her pulse is 69. Her oxygen saturation is 98%. .  General: Alert and oriented, in no acute distress HEENT: Head is normocephalic. Extraocular  movements are intact. Oropharynx is notable for resolving white mucositis in the right oropharynx and right oral cavity especially over tissue graft. Cannot exclude thrush though I would not favor this. The skin over her neck has healed well. Extremities: No cyanosis or edema. Lymphatics: see Neck Exam Psychiatric: Judgment and insight are intact. Affect is appropriate.   Lab Findings: Lab Results  Component Value Date   WBC 9.1 06/04/2017   HGB 13.7 06/04/2017   HCT 39.3 06/04/2017   MCV 90.6 06/04/2017   PLT 257 06/04/2017    No results found for: TSH  Radiographic Findings: No results found.  Impression/Plan:   Out of caution I will prescribe 3 days of fluconazole in case she has thrush superimposed upon her mucositis.   1) Head and Neck Cancer Status: healing well from radiation therapy.  2) Nutritional Status: Stabilizing.  PEG tube: None.  3) Risk Factors: The patient has been educated about risk factors including alcohol and tobacco abuse; they understand that avoidance of alcohol and tobacco is important to prevent recurrences as well as other cancers  4) Swallowing: Good.  5) Dental: Encouraged to continue regular followup with dentistry, and dental hygiene including fluoride rinses. She saw [facility] Dentist this month, and is disappointed that jaw cannot be realigned.  6) Follow-up in 4 months. The patient was encouraged to call with any issues or questions before then. She will see otolaryngology at [facility] in March to discuss surveillance imaging. I would recommend a CT of her neck and chest with contrast in about 2 months and I  defer to [facility] physicians about continuing scans there - as her prior imaging was there as well.   _____________________________________   Eppie Gibson, MD  This document serves as a record of services personally performed by Eppie Gibson, MD. It was created on his behalf by Linward Natal, a trained medical scribe. The creation of this record is  based on the scribe's personal observations and the provider's statements to them. This document has been checked and approved by the attending provider.

## 2017-07-10 ENCOUNTER — Telehealth: Payer: Self-pay | Admitting: *Deleted

## 2017-07-10 NOTE — Telephone Encounter (Signed)
CALLED PATIENT TO INFORM OF FU APPT. FOR 11-06-17 @ 11 AM, SPOKE WITH PATIENT AND SHE IS AWARE OF THIS APPT.

## 2017-07-11 ENCOUNTER — Ambulatory Visit: Payer: Medicare Other | Admitting: Physical Therapy

## 2017-07-11 ENCOUNTER — Encounter: Payer: Self-pay | Admitting: Physical Therapy

## 2017-07-11 DIAGNOSIS — M25511 Pain in right shoulder: Secondary | ICD-10-CM

## 2017-07-11 DIAGNOSIS — Z483 Aftercare following surgery for neoplasm: Secondary | ICD-10-CM

## 2017-07-11 DIAGNOSIS — M6281 Muscle weakness (generalized): Secondary | ICD-10-CM

## 2017-07-11 DIAGNOSIS — I890 Lymphedema, not elsewhere classified: Secondary | ICD-10-CM

## 2017-07-11 NOTE — Therapy (Signed)
[place], [place], [address] Phone: [phone]   Fax:  [phone]  Physical Therapy Treatment  Patient Details  Name: Nancy [name] MRN: [id number] Date of Birth: December 09, 1960 Referring Provider: Dr. Colin Benton    Encounter Date: 07/11/2017  PT End of Session - 07/11/17 1702    Visit Number  23 add kx    Number of Visits  35    Date for PT Re-Evaluation  08/25/16    PT Start Time  2620    PT Stop Time  1600    PT Time Calculation (min)  45 min    Activity Tolerance  Patient tolerated treatment well    Behavior During Therapy  [facility] for tasks assessed/performed       Past Medical History:  Diagnosis Date  . Bipolar 1 disorder ([place])   . Cervical cancer ([place])   . GERD (gastroesophageal reflux disease)   . History of tracheostomy 03/04/2017   She had trach placed during surgery and reversed before discharge [facility])  . Hypertension   . Hypothyroid   . Manic depressive disorder ([place])   . Tongue cancer [facility])     Past Surgical History:  Procedure Laterality Date  . bone-skin graft     free osteocutaneous flap with microvascular anastomosis: other than iliac crest, metatarsal or great toe.   . cervical lymphadenectomy  03/04/2017   (modified radical neck dissection) [facility].   . EXCISION OF TONGUE LESION Right 03/04/2017   PR excis tongue, mouth, jaw. [facility])  . exploration, carotid  Left 03/04/2017   [facility].   . GLOSSECTOMY  03/04/2017   Glossectomy; composite WO Radical neck dissect [facility]  . LARYNGOSCOPY  11/22/2016   Laryngoscopy, direct, operative with biopsy, [place]  03/04/2017   Reconstruct mandible/ maxil subperiosteal implant, [facility]  . PARTIAL GLOSSECTOMY    . SKIN FULL THICKNESS GRAFT  03/04/2017   [place]    . VAGINAL HYSTERECTOMY      There were no vitals filed for this  visit.  Subjective Assessment - 07/11/17 1655    Subjective  Pt comes in wearing kinesiotape.  She still has one spot in her cheek that bothers her but other than that she is doing well  She had some problems with left shoulder after episode of painting a room with lots of overhead activity but that is getting better     Pertinent History  chemo from july til sept 28. Oct 15 surgery for neck dissection, removal tongue and jaw with reconsturction with artery, skin and fibula from left leg. She has skin graft on left leg and has pain and swelling in leg.  She had physical therapy at home up until last week and is still getting home health RN for wound care  they took 16 lymph nodes and she has had swelling and decreased range of motion of her nedk and right arm     Patient Stated Goals  to get full range of motion of neck and shoulder and get rid of pain in leg and get help with swelling in her neck     Currently in Pain?  No/denies                      [facility] Adult PT Treatment/Exercise - 07/11/17 0001      Self-Care   Self-Care  Other Self-Care Comments  Other Self-Care Comments   discussed wearing jovi pak for firmness in right lateral neck and under chin.  Asked pt to bring it in for next session       Neck Exercises: Seated   Other Seated Exercise  neck ROM in sittng with extra time in neck extension with chin retraction to stretch tight tissue at anterior neck and scar       Lumbar Exercises: Prone   Other Prone Lumbar Exercises  10 second forearm and toes  plank x 3    Other Prone Lumbar Exercises  weight bearing through forearms       Shoulder Exercises: Supine   Protraction  Strengthening;Right;Left;10 reps 2 # weight       Shoulder Exercises: Prone   Retraction  AROM;Right;Left;10 reps    External Rotation  AROM;Right;10 reps    Other Prone Exercises  "Y" elevation with manual assist       Shoulder Exercises: Sidelying   External Rotation   Strengthening;Right;Left;10 reps;Weights 10 reps of concentric activity and 30 sec isometric hold     External Rotation Weight (lbs)  2 1 pound on left arm due to pain in upper arm       Manual Therapy   Manual Lymphatic Drainage (MLD)  In Supine: short neck, diaphragmatic breathing, bil shoulder collectors, axillary and pectoral nodes, bil upper quadrants and then stationary circles at lateral and anterior neck directing fluid down, bilateral cheeks to posterior neck,  towards lateral nec pathways directing towards bil axillae extra time spent on fullness under chin                PT Short Term Goals - 06/27/17 1313      PT SHORT TERM GOAL #1   Title  Pt will be independent in self manual lymph draiange and use of compression to manage neck lymphedema     Baseline  pt is able to do it at home, but gets more effect from the treatment here     Time  8    Period  Weeks    Status  On-going      PT SHORT TERM GOAL #2   Title  Pt will be independent in a home exercise program for strength of UE and LE  and neck range of motion     Status  Achieved      PT SHORT TERM GOAL #3   Title  Pt will increase active range of motion of right atm to 130 indicating increase in UE strength so that she can perfom household chores easier     Baseline  150    Status  Achieved      PT SHORT TERM GOAL #4   Title  Pt will report a decrease in overall pain by 25%    Status  Achieved      PT SHORT TERM GOAL #5   Title  Pt will increase right grip strength by an average of 10#     Status  Achieved        PT Long Term Goals - 06/27/17 1315      PT LONG TERM GOAL #1   Title  Pt will decrease quick DASH score to less than 38 indicating a functional improvment in left arm use     Baseline  63.64 on eval,  31.82 on 06/27/2017    Status  Achieved      PT LONG TERM GOAL #2   Title  Pt will increase 30  second sit to stand by 2 repetitions showing improvment in functional strength    Status  Achieved       PT LONG TERM GOAL #3   Title  pt will decrease TUG score by 2 seconds indicating an improvement in functional gait and balance.     Baseline  6.91 sec on eval 5.79 sec on 06/27/2017     Status  Partially Met      PT LONG TERM GOAL #4   Title  Pt will report a decrease in pain by 50%     Status  Achieved      PT LONG TERM GOAL #5   Title  Pt will increase the strength of right shoudler flexion and abduction to 4/5     Time  8    Period  Weeks    Status  New            Plan - 07/11/17 1703    Clinical Impression Statement  Pt appears to be doing much better overall.  Advanced exercises to prone position and pt still has trouble with scapular exercises in this position  fullness under chin still evident but is better in cheek     Rehab Potential  Good    Clinical Impairments Affecting Rehab Potential  extensive surgery ; completed radiation 06/18/17    PT Frequency  2x / week    PT Duration  8 weeks    PT Treatment/Interventions  ADLs/Self Care Home Management;Therapeutic activities;Therapeutic exercise;Taping;Manual techniques;Manual lymph drainage;Compression bandaging;Passive range of motion;Scar mobilization;Patient/family education;Functional mobility training;DME Instruction;Balance training;Neuromuscular re-education;Gait training;Stair training    PT Next Visit Plan  see if pt brought in jove pak and assess fit and add chips as necessary.  continue with prone scapular work Try UBE and adding 3 way raises to HEP, then continue with MLD to face and neck, use kinesiotape PRN. Progress strengthening to right scapula.    Consulted and Agree with Plan of Care  Patient       Patient will benefit from skilled therapeutic intervention in order to improve the following deficits and impairments:  Abnormal gait, Decreased endurance, Decreased skin integrity, Impaired sensation, Increased edema, Decreased scar mobility, Decreased knowledge of precautions, Decreased activity tolerance,  Decreased knowledge of use of DME, Decreased strength, Increased fascial restricitons, Impaired UE functional use, Pain, Difficulty walking, Decreased mobility, Decreased balance, Decreased range of motion, Impaired perceived functional ability, Postural dysfunction  Visit Diagnosis: Aftercare following surgery for neoplasm  Lymphedema, not elsewhere classified  Acute pain of right shoulder  Muscle weakness (generalized)     Problem List Patient Active Problem List   Diagnosis Date Noted  . 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 Squamous cell carcinoma of tongue ([place]) 04/23/2017   Donato Heinz. Owens Shark PT  Norwood Levo 07/11/2017, 5:06 PM  [place], [place], [address] Phone: [phone]   Fax:  [phone]  Name: Nancy [name] MRN: [id number] Date of Birth: August 01, 1960

## 2017-07-15 ENCOUNTER — Ambulatory Visit: Payer: Medicare Other

## 2017-07-15 DIAGNOSIS — Z483 Aftercare following surgery for neoplasm: Secondary | ICD-10-CM

## 2017-07-15 DIAGNOSIS — M6281 Muscle weakness (generalized): Secondary | ICD-10-CM

## 2017-07-15 DIAGNOSIS — M25511 Pain in right shoulder: Secondary | ICD-10-CM

## 2017-07-15 DIAGNOSIS — I890 Lymphedema, not elsewhere classified: Secondary | ICD-10-CM

## 2017-07-15 MED FILL — GABAPENTIN 600 MG TABLET: 600 | 21 days supply | Qty: 63 | Fill #0

## 2017-07-15 NOTE — Therapy (Signed)
[place], [place], [address] Phone: [phone]   Fax:  [phone]  Physical Therapy Treatment  Patient Details  Name: Nancy [name] MRN: [id number] Date of Birth: September 09, 1960 Referring Provider: Dr. Colin Benton    Encounter Date: 07/15/2017  PT End of Session - 07/15/17 1203    Visit Number  41 KX    Number of Visits  35    Date for PT Re-Evaluation  08/25/16    PT Start Time  1111    PT Stop Time  1157    PT Time Calculation (min)  46 min    Activity Tolerance  Patient tolerated treatment well    Behavior During Therapy  [facility] for tasks assessed/performed       Past Medical History:  Diagnosis Date  . Bipolar 1 disorder ([place])   . Cervical cancer ([place])   . GERD (gastroesophageal reflux disease)   . History of tracheostomy 03/04/2017   She had trach placed during surgery and reversed before discharge [facility])  . Hypertension   . Hypothyroid   . Manic depressive disorder ([place])   . Tongue cancer [facility])     Past Surgical History:  Procedure Laterality Date  . bone-skin graft     free osteocutaneous flap with microvascular anastomosis: other than iliac crest, metatarsal or great toe.   . cervical lymphadenectomy  03/04/2017   (modified radical neck dissection) [facility].   . EXCISION OF TONGUE LESION Right 03/04/2017   PR excis tongue, mouth, jaw. [facility])  . exploration, carotid  Left 03/04/2017   [facility].   . GLOSSECTOMY  03/04/2017   Glossectomy; composite WO Radical neck dissect [facility])  . LARYNGOSCOPY  11/22/2016   Laryngoscopy, direct, operative with biopsy, [place]  03/04/2017   Reconstruct mandible/ maxil subperiosteal implant, [facility]  . PARTIAL GLOSSECTOMY    . SKIN FULL THICKNESS GRAFT  03/04/2017   [place]    . VAGINAL HYSTERECTOMY      There were no vitals filed for this  visit.  Subjective Assessment - 07/15/17 1115    Subjective  My Rt cheek is doing so much better! But this morning I have this pocket of firm fluid right under my chin now and it's tender. I'm so surprised how weak my Rt arm is when I try to lift above my shoulder.     Pertinent History  chemo from july til sept 28. Oct 15 surgery for neck dissection, removal tongue and jaw with reconsturction with artery, skin and fibula from left leg. She has skin graft on left leg and has pain and swelling in leg.  She had physical therapy at home up until last week and is still getting home health RN for wound care  they took 27 lymph nodes and she has had swelling and decreased range of motion of her nedk and right arm     Patient Stated Goals  to get full range of motion of neck and shoulder and get rid of pain in leg and get help with swelling in her neck     Currently in Pain?  No/denies                      [facility] Adult PT Treatment/Exercise - 07/15/17 0001      Shoulder Exercises: Prone   Retraction  Strengthening;Both;20 reps;Weights 2x10 in a circuit  Retraction Weight (lbs)  2    Extension  Strengthening;Both;20 reps;Weights 2x10    External Rotation  Strengthening;Both;20 reps;Weights 2x10    External Rotation Weight (lbs)  2    Horizontal ABduction 1  Strengthening;Both;20 reps;Weights 2x10    Horizontal ABduction 1 Weight (lbs)  1    Other Prone Exercises  "Y" elevation with manual assist to Rt UE to for correct scapular rhythym. 2x10, 1 lb Lt UE (did 2 lb exs in a circuit, then 1 lb exs in a circuit)      Shoulder Exercises: Standing   Other Standing Exercises  Bil UE 3 way raises with 2 lbs, 10 x each except limited end ROM with Rt abduction but able to still do with weight. (with back, sholders and head against wall)      Manual Therapy   Manual Lymphatic Drainage (MLD)  In Supine: short neck, diaphragmatic breathing, bil shoulder collectors, axillary and pectoral nodes,  bil upper quadrants and then stationary circles at lateral and anterior neck directing fluid down, bilateral cheeks to posterior neck,  towards lateral nec pathways directing towards bil axillae extra time spent on fullness under chin      Kinesiotix   Edema  skin kote applied and "I" band under chin at area of swelling             PT Education - 07/15/17 1202    Education provided  Yes    Education Details  Bil UE 3 way raises    Person(s) Educated  Patient    Methods  Explanation;Demonstration    Comprehension  Verbalized understanding;Returned demonstration;Need further instruction       PT Short Term Goals - 06/27/17 1313      PT SHORT TERM GOAL #1   Title  Pt will be independent in self manual lymph draiange and use of compression to manage neck lymphedema     Baseline  pt is able to do it at home, but gets more effect from the treatment here     Time  8    Period  Weeks    Status  On-going      PT SHORT TERM GOAL #2   Title  Pt will be independent in a home exercise program for strength of UE and LE  and neck range of motion     Status  Achieved      PT SHORT TERM GOAL #3   Title  Pt will increase active range of motion of right atm to 130 indicating increase in UE strength so that she can perfom household chores easier     Baseline  150    Status  Achieved      PT SHORT TERM GOAL #4   Title  Pt will report a decrease in overall pain by 25%    Status  Achieved      PT SHORT TERM GOAL #5   Title  Pt will increase right grip strength by an average of 10#     Status  Achieved        PT Long Term Goals - 06/27/17 1315      PT LONG TERM GOAL #1   Title  Pt will decrease quick DASH score to less than 38 indicating a functional improvment in left arm use     Baseline  63.64 on eval,  31.82 on 06/27/2017    Status  Achieved      PT LONG TERM GOAL #2   Title  Pt will increase 30 second sit to stand by 2 repetitions showing improvment in functional strength     Status  Achieved      PT LONG TERM GOAL #3   Title  pt will decrease TUG score by 2 seconds indicating an improvement in functional gait and balance.     Baseline  6.91 sec on eval 5.79 sec on 06/27/2017     Status  Partially Met      PT LONG TERM GOAL #4   Title  Pt will report a decrease in pain by 50%     Status  Achieved      PT LONG TERM GOAL #5   Title  Pt will increase the strength of right shoudler flexion and abduction to 4/5     Time  8    Period  Weeks    Status  New            Plan - 07/15/17 1203    Clinical Impression Statement  Pt is very pleased with reduction of lymphatic fluid at her Rt cheek but still has area under chin that she started notcing last week that is full and firm upon palpation. After bil UE strengthening exercises, which overall pt tolerated well though did find challenging, focused on manual lymph draiange. Added kinesiotape to area under chin to further allow drainage of fluid.     Rehab Potential  Good    Clinical Impairments Affecting Rehab Potential  extensive surgery ; completed radiation 06/18/17    PT Frequency  2x / week    PT Duration  8 weeks    PT Treatment/Interventions  ADLs/Self Care Home Management;Therapeutic activities;Therapeutic exercise;Taping;Manual techniques;Manual lymph drainage;Compression bandaging;Passive range of motion;Scar mobilization;Patient/family education;Functional mobility training;DME Instruction;Balance training;Neuromuscular re-education;Gait training;Stair training    PT Next Visit Plan  see if pt brought in jove pak and assess fit and add chips as necessary.  continue with prone scapular work Try UBE and review 3 way raises (and issue handout) to HEP, then continue with MLD to face and neck, use kinesiotape PRN. Progress strengthening to right scapula.    Consulted and Agree with Plan of Care  Patient       Patient will benefit from skilled therapeutic intervention in order to improve the following deficits  and impairments:  Abnormal gait, Decreased endurance, Decreased skin integrity, Impaired sensation, Increased edema, Decreased scar mobility, Decreased knowledge of precautions, Decreased activity tolerance, Decreased knowledge of use of DME, Decreased strength, Increased fascial restricitons, Impaired UE functional use, Pain, Difficulty walking, Decreased mobility, Decreased balance, Decreased range of motion, Impaired perceived functional ability, Postural dysfunction  Visit Diagnosis: Aftercare following surgery for neoplasm  Lymphedema, not elsewhere classified  Acute pain of right shoulder  Muscle weakness (generalized)     Problem List Patient Active Problem List   Diagnosis Date Noted  . 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 Squamous cell carcinoma of tongue (HCC) 04/23/2017    Otelia Limes, PTA 07/15/2017, 12:13 PM  [place], [place], [address] Phone: [phone]   Fax:  [phone]  Name: Nancy [name] MRN: [id number] Date of Birth: 08-Jul-1960

## 2017-07-17 ENCOUNTER — Ambulatory Visit: Payer: Medicare Other

## 2017-07-17 DIAGNOSIS — M6281 Muscle weakness (generalized): Secondary | ICD-10-CM

## 2017-07-17 DIAGNOSIS — Z483 Aftercare following surgery for neoplasm: Secondary | ICD-10-CM | POA: Diagnosis not present

## 2017-07-17 DIAGNOSIS — M25511 Pain in right shoulder: Secondary | ICD-10-CM

## 2017-07-17 DIAGNOSIS — I890 Lymphedema, not elsewhere classified: Secondary | ICD-10-CM

## 2017-07-17 NOTE — Therapy (Signed)
[place], [place], [address] Phone: [phone]   Fax:  [phone]  Physical Therapy Treatment  Patient Details  Name: Nancy [name] MRN: [id number] Date of Birth: 1961-04-23 Referring Provider: Dr. Colin Benton    Encounter Date: 07/17/2017  PT End of Session - 07/17/17 1241    Visit Number  [place]    Number of Visits  35    Date for PT Re-Evaluation  08/25/16    PT Start Time  1111    PT Stop Time  1159    PT Time Calculation (min)  48 min    Activity Tolerance  Patient tolerated treatment well    Behavior During Therapy  [facility] for tasks assessed/performed       Past Medical History:  Diagnosis Date  . Bipolar 1 disorder ([place])   . Cervical cancer ([place])   . GERD (gastroesophageal reflux disease)   . History of tracheostomy 03/04/2017   She had trach placed during surgery and reversed before discharge [facility])  . Hypertension   . Hypothyroid   . Manic depressive disorder ([place])   . Tongue cancer [facility])     Past Surgical History:  Procedure Laterality Date  . bone-skin graft     free osteocutaneous flap with microvascular anastomosis: other than iliac crest, metatarsal or great toe.   . cervical lymphadenectomy  03/04/2017   (modified radical neck dissection) [facility].   . EXCISION OF TONGUE LESION Right 03/04/2017   PR excis tongue, mouth, jaw. [facility])  . exploration, carotid  Left 03/04/2017   [facility].   . GLOSSECTOMY  03/04/2017   Glossectomy; composite WO Radical neck dissect [facility])  . LARYNGOSCOPY  11/22/2016   Laryngoscopy, direct, operative with biopsy, [place]  03/04/2017   Reconstruct mandible/ maxil subperiosteal implant, [facility]  . PARTIAL GLOSSECTOMY    . SKIN FULL THICKNESS GRAFT  03/04/2017   [place]    . VAGINAL HYSTERECTOMY      There were no vitals filed for this  visit.  Subjective Assessment - 07/17/17 1119    Subjective  My Rt cheek is just a little swollen today and my chin is better! The tape really helped. Maybe we can do that again and the tape to my cheek again. I felt good after last visit and was able to weed my garden, I was surprised at how good I felt.     Pertinent History  chemo from july til sept 28. Oct 15 surgery for neck dissection, removal tongue and jaw with reconsturction with artery, skin and fibula from left leg. She has skin graft on left leg and has pain and swelling in leg.  She had physical therapy at home up until last week and is still getting home health RN for wound care  they took 56 lymph nodes and she has had swelling and decreased range of motion of her nedk and right arm     Patient Stated Goals  to get full range of motion of neck and shoulder and get rid of pain in leg and get help with swelling in her neck     Currently in Pain?  No/denies                      [facility] Adult PT Treatment/Exercise - 07/17/17 0001      Lumbar Exercises: Aerobic   UBE (Upper  Arm Bike)  Level 2 x7 mins total (4' fwd/3' bkwd)      Shoulder Exercises: Standing   Other Standing Exercises  Bil UE 3 way raises with 2 lbs, 2 x10 each except limited end ROM with Rt abduction but with no weight today. (with back, shoulders and head against wall)      Manual Therapy   Manual Lymphatic Drainage (MLD)  In Supine: short neck, diaphragmatic breathing, bil shoulder collectors, axillary and pectoral nodes, bil upper quadrants and then stationary circles at lateral and anterior neck directing fluid down, bilateral cheeks to posterior neck,  towards lateral nec pathways directing towards bil axillae extra time spent on fullness under chin      Kinesiotix   Edema  skin kote applied and "I" band under chin at area of swelling; then another piece with 2 fingers (tried 3 but due to anatomy of neck it wouldn't stay) from Rt upper trap to cheek                PT Short Term Goals - 06/27/17 1313      PT SHORT TERM GOAL #1   Title  Pt will be independent in self manual lymph draiange and use of compression to manage neck lymphedema     Baseline  pt is able to do it at home, but gets more effect from the treatment here     Time  8    Period  Weeks    Status  On-going      PT SHORT TERM GOAL #2   Title  Pt will be independent in a home exercise program for strength of UE and LE  and neck range of motion     Status  Achieved      PT SHORT TERM GOAL #3   Title  Pt will increase active range of motion of right atm to 130 indicating increase in UE strength so that she can perfom household chores easier     Baseline  150    Status  Achieved      PT SHORT TERM GOAL #4   Title  Pt will report a decrease in overall pain by 25%    Status  Achieved      PT SHORT TERM GOAL #5   Title  Pt will increase right grip strength by an average of 10#     Status  Achieved        PT Long Term Goals - 06/27/17 1315      PT LONG TERM GOAL #1   Title  Pt will decrease quick DASH score to less than 38 indicating a functional improvment in left arm use     Baseline  63.64 on eval,  31.82 on 06/27/2017    Status  Achieved      PT LONG TERM GOAL #2   Title  Pt will increase 30 second sit to stand by 2 repetitions showing improvment in functional strength    Status  Achieved      PT LONG TERM GOAL #3   Title  pt will decrease TUG score by 2 seconds indicating an improvement in functional gait and balance.     Baseline  6.91 sec on eval 5.79 sec on 06/27/2017     Status  Partially Met      PT LONG TERM GOAL #4   Title  Pt will report a decrease in pain by 50%     Status  Achieved  PT LONG TERM GOAL #5   Title  Pt will increase the strength of right shoudler flexion and abduction to 4/5     Time  8    Period  Weeks    Status  New            Plan - 07/17/17 1241    Clinical Impression Statement  Progressed strength to  include UBE today which pt tolerated well and reported this feeling challenging. Also progressed pt to 2 sets of 10 with 3 way raises which she tolerated well though could not use weight for abduction reporting some pain with this today (possibly from weeding in her garden for a few hours yesterday). Continued with kinesiotape as this has proved to be very beneficial to furthering reduction of lymphedema at Rt cheek and chin.     Rehab Potential  Good    Clinical Impairments Affecting Rehab Potential  extensive surgery ; completed radiation 06/18/17    PT Frequency  2x / week    PT Duration  8 weeks    PT Treatment/Interventions  ADLs/Self Care Home Management;Therapeutic activities;Therapeutic exercise;Taping;Manual techniques;Manual lymph drainage;Compression bandaging;Passive range of motion;Scar mobilization;Patient/family education;Functional mobility training;DME Instruction;Balance training;Neuromuscular re-education;Gait training;Stair training    PT Next Visit Plan  see if pt brought in jove pak and assess fit and add chips as necessary.  continue with prone scapular work, UBE, and 3 way raises (and issue handout) to HEP, then continue with MLD to face and neck, use kinesiotape and instruct pt in this to be able to cont at home.     Consulted and Agree with Plan of Care  Patient       Patient will benefit from skilled therapeutic intervention in order to improve the following deficits and impairments:  Abnormal gait, Decreased endurance, Decreased skin integrity, Impaired sensation, Increased edema, Decreased scar mobility, Decreased knowledge of precautions, Decreased activity tolerance, Decreased knowledge of use of DME, Decreased strength, Increased fascial restricitons, Impaired UE functional use, Pain, Difficulty walking, Decreased mobility, Decreased balance, Decreased range of motion, Impaired perceived functional ability, Postural dysfunction  Visit Diagnosis: Aftercare following  surgery for neoplasm  Lymphedema, not elsewhere classified  Acute pain of right shoulder  Muscle weakness (generalized)     Problem List Patient Active Problem List   Diagnosis Date Noted  . 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 Squamous cell carcinoma of tongue (HCC) 04/23/2017    Otelia Limes, PTA 07/17/2017, 12:48 PM  [place], [place], [address] Phone: [phone]   Fax:  [phone]  Name: Nancy [name] MRN: [id number] Date of Birth: [birth year]

## 2017-07-22 ENCOUNTER — Ambulatory Visit: Payer: Medicare Other | Attending: Otolaryngology

## 2017-07-22 DIAGNOSIS — I890 Lymphedema, not elsewhere classified: Secondary | ICD-10-CM | POA: Insufficient documentation

## 2017-07-22 DIAGNOSIS — M25511 Pain in right shoulder: Secondary | ICD-10-CM | POA: Diagnosis present

## 2017-07-22 DIAGNOSIS — R1312 Dysphagia, oropharyngeal phase: Secondary | ICD-10-CM | POA: Insufficient documentation

## 2017-07-22 DIAGNOSIS — Z483 Aftercare following surgery for neoplasm: Secondary | ICD-10-CM | POA: Diagnosis present

## 2017-07-22 DIAGNOSIS — M6281 Muscle weakness (generalized): Secondary | ICD-10-CM | POA: Insufficient documentation

## 2017-07-22 NOTE — Patient Instructions (Signed)
Extension (Active)    Bring arm back as far as possible. For a greater challenge, do this while lying down on stomach. Repeat _10___ times, 1-2 sets at a time. Do __1-2__ sessions per day.  Scapular Retraction: Abduction / Extension (Prone)    Lie with arms out from sides 90. Pinch shoulder blades together and raise arms a few inches from floor. Repeat __10__ times per set. Do __1-2__ sets per session. Do _1-2___ sessions per day.    Scapular: Retraction (Prone)    Holding _1-2___ pound weights, keep arms out from sides and elbows bent. Pull elbows back, pinching shoulder blades together. Repeat __10__ times per set. Do __1-2__ sets per session. Do _1-2___ sessions per day.   Cancer Rehab [phone]

## 2017-07-22 NOTE — Therapy (Signed)
[place], [place], [address] Phone: [phone]   Fax:  [phone]  Physical Therapy Treatment  Patient Details  Name: Nancy [name] MRN: [id number] Date of Birth: 12-Aug-1960 Referring Provider: Dr. Colin Benton    Encounter Date: 07/22/2017  PT End of Session - 07/22/17 1054    Visit Number  87 KX    Number of Visits  35    Date for PT Re-Evaluation  08/25/16    PT Start Time  0937    PT Stop Time  1018    PT Time Calculation (min)  41 min    Activity Tolerance  Patient tolerated treatment well    Behavior During Therapy  [facility] for tasks assessed/performed       Past Medical History:  Diagnosis Date  . Bipolar 1 disorder ([place])   . Cervical cancer ([place])   . GERD (gastroesophageal reflux disease)   . History of tracheostomy 03/04/2017   She had trach placed during surgery and reversed before discharge [facility])  . Hypertension   . Hypothyroid   . Manic depressive disorder ([place])   . Tongue cancer [facility])     Past Surgical History:  Procedure Laterality Date  . bone-skin graft     free osteocutaneous flap with microvascular anastomosis: other than iliac crest, metatarsal or great toe.   . cervical lymphadenectomy  03/04/2017   (modified radical neck dissection) [facility].   . EXCISION OF TONGUE LESION Right 03/04/2017   PR excis tongue, mouth, jaw. [facility])  . exploration, carotid  Left 03/04/2017   [facility].   . GLOSSECTOMY  03/04/2017   Glossectomy; composite WO Radical neck dissect [facility])  . LARYNGOSCOPY  11/22/2016   Laryngoscopy, direct, operative with biopsy, [place]  03/04/2017   Reconstruct mandible/ maxil subperiosteal implant, [facility]  . PARTIAL GLOSSECTOMY    . SKIN FULL THICKNESS GRAFT  03/04/2017   [place]    . VAGINAL HYSTERECTOMY      There were no vitals filed for this  visit.                   [facility] Adult PT Treatment/Exercise - 07/22/17 0001      Self-Care   Self-Care  Other Self-Care Comments    Other Self-Care Comments   Issued handout for prone exercises done in previous visits. Did not have time to have pt deomnstrate exercises today but therapist briefly demonstrated and pt was able to verbalize proper techniques.       Lumbar Exercises: Aerobic   UBE (Upper Arm Bike)  Level 1.5 6 mins (4' FWD/2' BKWD) Pt had increased Lt UE pain after last time so decr some       Manual Therapy   Edema Management  Pt brought her Jovi Pak and fitted this to her which is a great fit. Added the extender strap to her posterior neck piece and added 1/2" gray foam at her Rt cheek as the garment bisected area of swelling at Rt cheek. With foam pt reported garment overall feeling good and "like it was doing something".      Kinesiotix   Edema  skin kote applied and had pt place after instruction "I" band under chin at area of swelling; then another piece, placed by therapist while instructing pt, with 3 fingers (being sure to stay posterior to incision) from Rt upper trap to cheek. Instructed  pt able to get this and skincote at pharmacy for use at home since she was instructed in this today.             PT Education - 07/22/17 1003    Education provided  Yes    Education Details  Prone scapular strength     Person(s) Educated  Patient    Methods  Explanation;Demonstration;Handout    Comprehension  Verbalized understanding;Need further instruction       PT Short Term Goals - 06/27/17 1313      PT SHORT TERM GOAL #1   Title  Pt will be independent in self manual lymph draiange and use of compression to manage neck lymphedema     Baseline  pt is able to do it at home, but gets more effect from the treatment here     Time  8    Period  Weeks    Status  On-going      PT SHORT TERM GOAL #2   Title  Pt will be independent in a home exercise  program for strength of UE and LE  and neck range of motion     Status  Achieved      PT SHORT TERM GOAL #3   Title  Pt will increase active range of motion of right atm to 130 indicating increase in UE strength so that she can perfom household chores easier     Baseline  150    Status  Achieved      PT SHORT TERM GOAL #4   Title  Pt will report a decrease in overall pain by 25%    Status  Achieved      PT SHORT TERM GOAL #5   Title  Pt will increase right grip strength by an average of 10#     Status  Achieved        PT Long Term Goals - 06/27/17 1315      PT LONG TERM GOAL #1   Title  Pt will decrease quick DASH score to less than 38 indicating a functional improvment in left arm use     Baseline  63.64 on eval,  31.82 on 06/27/2017    Status  Achieved      PT LONG TERM GOAL #2   Title  Pt will increase 30 second sit to stand by 2 repetitions showing improvment in functional strength    Status  Achieved      PT LONG TERM GOAL #3   Title  pt will decrease TUG score by 2 seconds indicating an improvement in functional gait and balance.     Baseline  6.91 sec on eval 5.79 sec on 06/27/2017     Status  Partially Met      PT LONG TERM GOAL #4   Title  Pt will report a decrease in pain by 50%     Status  Achieved      PT LONG TERM GOAL #5   Title  Pt will increase the strength of right shoudler flexion and abduction to 4/5     Time  8    Period  Weeks    Status  New            Plan - 07/22/17 1055    Clinical Impression Statement  Spent alot of time today adjusting/assessing pts Jovi Pak and making sure she felt independent with donning/doffing garment/ With foam that was issued today pt reported garment sfeeling very comfortable and  could tell it was giving good compression at areas of swelling. Then spent time educating pt on how to use and place kinesiotape. Did issue handout for prone exercises done at previous visits but did not have time to review them again though  pt was able to verbalize a good understanding of them. She felt very good about todays session reporting she learned things that would be beneficial for years to come.     Rehab Potential  Good    Clinical Impairments Affecting Rehab Potential  extensive surgery ; completed radiation 06/18/17    PT Frequency  2x / week    PT Duration  8 weeks    PT Treatment/Interventions  ADLs/Self Care Home Management;Therapeutic activities;Therapeutic exercise;Taping;Manual techniques;Manual lymph drainage;Compression bandaging;Passive range of motion;Scar mobilization;Patient/family education;Functional mobility training;DME Instruction;Balance training;Neuromuscular re-education;Gait training;Stair training    PT Next Visit Plan  see if pt wearing jovi pak/is it comfortable and helping?continue with prone scapular work (review exs issued today to assess technique), UBE, and 3 way raises (and issue handout) to HEP, then continue with MLD to face and neck, use kinesiotape and review with pt to be able to cont at home.     Consulted and Agree with Plan of Care  Patient       Patient will benefit from skilled therapeutic intervention in order to improve the following deficits and impairments:  Abnormal gait, Decreased endurance, Decreased skin integrity, Impaired sensation, Increased edema, Decreased scar mobility, Decreased knowledge of precautions, Decreased activity tolerance, Decreased knowledge of use of DME, Decreased strength, Increased fascial restricitons, Impaired UE functional use, Pain, Difficulty walking, Decreased mobility, Decreased balance, Decreased range of motion, Impaired perceived functional ability, Postural dysfunction  Visit Diagnosis: Aftercare following surgery for neoplasm  Lymphedema, not elsewhere classified  Acute pain of right shoulder  Muscle weakness (generalized)     Problem List Patient Active Problem List   Diagnosis Date Noted  . 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 Squamous cell carcinoma of tongue ([facility]) 04/23/2017    Otelia Limes, PTA 07/22/2017, 11:00 AM  [place], [place], [address] Phone: [phone]   Fax:  [phone]  Name: Nancy [name] MRN: [id number] Date of Birth: 25-Aug-1960

## 2017-07-23 ENCOUNTER — Encounter (HOSPITAL_COMMUNITY): Payer: Self-pay | Admitting: Dentistry

## 2017-07-23 ENCOUNTER — Ambulatory Visit (HOSPITAL_COMMUNITY): Payer: Self-pay | Admitting: Dentistry

## 2017-07-23 VITALS — BP 105/69 | HR 79 | Temp 98.4°F | Wt 168.0 lb

## 2017-07-23 DIAGNOSIS — C029 Malignant neoplasm of tongue, unspecified: Secondary | ICD-10-CM

## 2017-07-23 DIAGNOSIS — Z5189 Encounter for other specified aftercare: Secondary | ICD-10-CM | POA: Diagnosis not present

## 2017-07-23 DIAGNOSIS — K117 Disturbances of salivary secretion: Secondary | ICD-10-CM

## 2017-07-23 DIAGNOSIS — R682 Dry mouth, unspecified: Secondary | ICD-10-CM

## 2017-07-23 DIAGNOSIS — R1310 Dysphagia, unspecified: Secondary | ICD-10-CM

## 2017-07-23 DIAGNOSIS — R432 Parageusia: Secondary | ICD-10-CM

## 2017-07-23 DIAGNOSIS — Z9221 Personal history of antineoplastic chemotherapy: Secondary | ICD-10-CM

## 2017-07-23 DIAGNOSIS — Z9889 Other specified postprocedural states: Secondary | ICD-10-CM

## 2017-07-23 DIAGNOSIS — C049 Malignant neoplasm of floor of mouth, unspecified: Secondary | ICD-10-CM

## 2017-07-23 DIAGNOSIS — M264 Malocclusion, unspecified: Secondary | ICD-10-CM

## 2017-07-23 DIAGNOSIS — Z923 Personal history of irradiation: Secondary | ICD-10-CM

## 2017-07-23 NOTE — Patient Instructions (Signed)
RECOMMENDATIONS: 1. Brush after meals and at bedtime.  Use fluoride at bedtime. 2. Use trismus exercises as directed if acceptable with [facility] ENT surgeons.. 3. Use Biotene Rinse or salt water/baking soda rinses a needed. 4. Multiple sips of water as needed. 5. F/U with local Prosthodontist-Dr. Vernard Gambles for evaluation for prosthodontic options and follow up care.    Lenn Cal, DDS

## 2017-07-23 NOTE — Progress Notes (Signed)
07/23/2017  Patient Name:   Nancy [name] Date of Birth:   Oct 01, 1960 Medical Record Number: [id number]  BP 105/69 (BP Location: Right Arm)   Pulse 79   Temp 98.4 F (36.9 C)   Nancy [name] presents for oral examination after radiation therapy. Patient has completed radiation treatments approach 05/07/2017 through 06/18/2017. The patient was also evaluated at [facility] by Dr. Rande Lawman on 07/04/2017. The patient was given the option of being referred to [facility] graduate prosthodontics or private prosthodontist for follow-up care.  REVIEW OF CHIEF COMPLAINTS:  DRY MOUTH: Yes. HARD TO SWALLOW:   Yes.  HURT TO SWALLOW: Yes, at times. TASTE CHANGES:  Taste is slowly coming back. SORES IN MOUTH: Yes. TRISMUS: No problems with trismus symptoms. WEIGHT: 168 pounds  HOME OH REGIMEN:  BRUSHING: Twice a day FLOSSING: Patient is not flossing. RINSING: The patient is not using any rinses. FLUORIDE: The patient is using fluoride daily either in a brush on method or use of fluoride trays. TRISMUS EXERCISES:  Maximum interincisal opening: 35 mm   DENTAL EXAM:  Oral Hygiene:(PLAQUE):   Minimal plaque is noted. Oral hygiene improvement suggested. LOCATION OF MUCOSITIS: Back of her throat. DESCRIPTION OF SALIVA: Decreased and foamy saliva. ANY EXPOSED BONE: None noted. OTHER WATCHED AREAS: Previous site of surgical resection and reconstructive surgery. Malocclusion as before. DX: Xerostomia, Dysgeusia, Dysphagia, Accretions, Mucositis and Malocclusion  RECOMMENDATIONS: 1. Brush after meals and at bedtime.  Use fluoride at bedtime. 2. Use trismus exercises as directed if acceptable with [facility] ENT surgeons.. 3. Use Biotene Rinse or salt water/baking soda rinses a needed. 4. Multiple sips of water as needed. 5. F/U with local Prosthodontist-Dr. Vernard Gambles for evaluation for prosthodontic options and follow up care.    Lenn Cal, DDS

## 2017-07-24 ENCOUNTER — Encounter: Payer: Self-pay | Admitting: Physical Therapy

## 2017-07-29 ENCOUNTER — Ambulatory Visit: Payer: Medicare Other | Admitting: Physical Therapy

## 2017-07-29 ENCOUNTER — Encounter: Payer: Self-pay | Admitting: Physical Therapy

## 2017-07-29 DIAGNOSIS — Z483 Aftercare following surgery for neoplasm: Secondary | ICD-10-CM | POA: Diagnosis not present

## 2017-07-29 DIAGNOSIS — I890 Lymphedema, not elsewhere classified: Secondary | ICD-10-CM

## 2017-07-29 DIAGNOSIS — M25511 Pain in right shoulder: Secondary | ICD-10-CM

## 2017-07-29 DIAGNOSIS — M6281 Muscle weakness (generalized): Secondary | ICD-10-CM

## 2017-07-29 NOTE — Therapy (Signed)
[place], [place], [address] Phone: [phone]   Fax:  [phone]  Physical Therapy Treatment  Patient Details  Name: Nancy [name] MRN: [id number] Date of Birth: 12-Dec-1960 Referring Provider: Dr. Colin Benton    Encounter Date: 07/29/2017  PT End of Session - 07/29/17 1542    Visit Number  40 [place]    Number of Visits  35    Date for PT Re-Evaluation  08/25/16    PT Start Time  1430    PT Stop Time  1515    PT Time Calculation (min)  45 min    Activity Tolerance  Patient tolerated treatment well    Behavior During Therapy  [facility] for tasks assessed/performed       Past Medical History:  Diagnosis Date  . Bipolar 1 disorder ([place])   . Cervical cancer ([place])   . GERD (gastroesophageal reflux disease)   . History of tracheostomy 03/04/2017   She had trach placed during surgery and reversed before discharge [place])  . Hypertension   . Hypothyroid   . Manic depressive disorder ([place])   . Tongue cancer [facility])     Past Surgical History:  Procedure Laterality Date  . bone-skin graft     free osteocutaneous flap with microvascular anastomosis: other than iliac crest, metatarsal or great toe.   . cervical lymphadenectomy  03/04/2017   (modified radical neck dissection) [facility].   . EXCISION OF TONGUE LESION Right 03/04/2017   PR excis tongue, mouth, jaw. [facility])  . exploration, carotid  Left 03/04/2017   [facility].   . GLOSSECTOMY  03/04/2017   Glossectomy; composite WO Radical neck dissect [facility])  . LARYNGOSCOPY  11/22/2016   Laryngoscopy, direct, operative with biopsy, [place]  03/04/2017   Reconstruct mandible/ maxil subperiosteal implant, [facility]  . PARTIAL GLOSSECTOMY    . SKIN FULL THICKNESS GRAFT  03/04/2017   [place]    . VAGINAL HYSTERECTOMY      There were no vitals filed for this  visit.  Subjective Assessment - 07/29/17 1535    Subjective  Pt reports her cheek is more swollen and she can tell she is biting her lip more.  She is going back to see her surgeon tomorrow and will ask him about the pain that she is having across both of her shoulders. she thinks it happened after she was doing some raking recently. she is now doing all of her household work by herself.     Pertinent History  chemo from july til sept 28. Oct 15 surgery for neck dissection, removal tongue and jaw with reconsturction with artery, skin and fibula from left leg. She has skin graft on left leg and has pain and swelling in leg.  She had physical therapy at home up until last week and is still getting home health RN for wound care  they took 68 lymph nodes and she has had swelling and decreased range of motion of her nedk and right arm     Patient Stated Goals  to get full range of motion of neck and shoulder and get rid of pain in leg and get help with swelling in her neck     Currently in Pain?  Yes    Pain Score  -- did not rate     Pain Location  Arm    Pain Orientation  Left;Upper;Mid  Pain Descriptors / Indicators  Sore    Pain Type  Acute pain    Pain Radiating Towards  toward [place]boe     Pain Onset  In the past 7 days    Pain Frequency  Intermittent                      [facility] Adult PT Treatment/Exercise - 07/29/17 0001      Self-Care   Other Self-Care Comments   gave pt handout about strength class that is offerred at [facility] on Thursdays       Manual Therapy   Myofascial Release  to paracervical muscles  with prolonged pressure to trigger points especially on the right side     Manual Lymphatic Drainage (MLD)  In Supine: short neck, diaphragmatic breathing, bil shoulder collectors, axillary and pectoral nodes, bil upper quadrants and then stationary circles at lateral and anterior neck directing fluid down, bilateral cheeks to posterior neck,  towards lateral nec  pathways directing towards bil axillae extra time spent on fullness under chin    Manual Traction  to cervical area       Kinesiotix   Edema  skin kote, the 4 fingered prong tape from lateral cheek, chin to back of neck                PT Short Term Goals - 06/27/17 1313      PT SHORT TERM GOAL #1   Title  Pt will be independent in self manual lymph draiange and use of compression to manage neck lymphedema     Baseline  pt is able to do it at home, but gets more effect from the treatment here     Time  8    Period  Weeks    Status  On-going      PT SHORT TERM GOAL #2   Title  Pt will be independent in a home exercise program for strength of UE and LE  and neck range of motion     Status  Achieved      PT SHORT TERM GOAL #3   Title  Pt will increase active range of motion of right atm to 130 indicating increase in UE strength so that she can perfom household chores easier     Baseline  150    Status  Achieved      PT SHORT TERM GOAL #4   Title  Pt will report a decrease in overall pain by 25%    Status  Achieved      PT SHORT TERM GOAL #5   Title  Pt will increase right grip strength by an average of 10#     Status  Achieved        PT Long Term Goals - 06/27/17 1315      PT LONG TERM GOAL #1   Title  Pt will decrease quick DASH score to less than 38 indicating a functional improvment in left arm use     Baseline  63.64 on eval,  31.82 on 06/27/2017    Status  Achieved      PT LONG TERM GOAL #2   Title  Pt will increase 30 second sit to stand by 2 repetitions showing improvment in functional strength    Status  Achieved      PT LONG TERM GOAL #3   Title  pt will decrease TUG score by 2 seconds indicating an improvement in functional gait and balance.  Baseline  6.91 sec on eval 5.79 sec on 06/27/2017     Status  Partially Met      PT LONG TERM GOAL #4   Title  Pt will report a decrease in pain by 50%     Status  Achieved      PT LONG TERM GOAL #5   Title   Pt will increase the strength of right shoudler flexion and abduction to 4/5     Time  8    Period  Weeks    Status  New            Plan - 07/29/17 1543    Clinical Impression Statement  Pt reports she has not work JoviPak at home. She was encouraged to wear it for at least 30 minutes Pt had increased pain across both shoulders today when she was lying on her back for MLD but felt better when it was placed up on a pillow .  She also got relief with manual cervical traction     Clinical Impairments Affecting Rehab Potential  extensive surgery ; completed radiation 06/18/17    PT Frequency  2x / week    PT Duration  8 weeks    PT Treatment/Interventions  ADLs/Self Care Home Management;Therapeutic activities;Therapeutic exercise;Taping;Manual techniques;Manual lymph drainage;Compression bandaging;Passive range of motion;Scar mobilization;Patient/family education;Functional mobility training;DME Instruction;Balance training;Neuromuscular re-education;Gait training;Stair training    PT Next Visit Plan  see if pt wearing jovi pak/is it comfortable and helping?continue with prone scapular work (review exs issued today to assess technique), UBE, and 3 way raises (and issue handout) to HEP, then continue with MLD to face and neck, use kinesiotape and review with pt to be able to cont at home.     Consulted and Agree with Plan of Care  Patient       Patient will benefit from skilled therapeutic intervention in order to improve the following deficits and impairments:     Visit Diagnosis: Aftercare following surgery for neoplasm  Lymphedema, not elsewhere classified  Acute pain of right shoulder  Muscle weakness (generalized)     Problem List Patient Active Problem List   Diagnosis Date Noted  . Squamous cell carcinoma of lateral tongue ([place]) 04/24/2017  . 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 Squamous cell carcinoma of tongue ([place]) 04/23/2017   Donato Heinz. Owens Shark  PT  [place] Levo 07/29/2017, 3:50 PM  [place], [place], [address] Phone: [phone]   Fax:  [phone]  Name: LILLIANNE EICK MRN: [id number] Date of Birth: March 01, 1961

## 2017-07-31 ENCOUNTER — Ambulatory Visit: Payer: Medicare Other | Admitting: Physical Therapy

## 2017-07-31 ENCOUNTER — Encounter: Payer: Self-pay | Admitting: Physical Therapy

## 2017-07-31 DIAGNOSIS — Z483 Aftercare following surgery for neoplasm: Secondary | ICD-10-CM | POA: Diagnosis not present

## 2017-07-31 DIAGNOSIS — I890 Lymphedema, not elsewhere classified: Secondary | ICD-10-CM

## 2017-07-31 DIAGNOSIS — M6281 Muscle weakness (generalized): Secondary | ICD-10-CM

## 2017-07-31 DIAGNOSIS — M25511 Pain in right shoulder: Secondary | ICD-10-CM

## 2017-07-31 NOTE — Therapy (Signed)
[place], [place], [address] Phone: [phone]   Fax:  [phone]  Physical Therapy Treatment  Patient Details  Name: Nancy [name] MRN: [id number] Date of Birth: June 18, 1960 Referring Provider: Dr. Colin Benton    Encounter Date: 07/31/2017  PT End of Session - 07/31/17 1212    Visit Number  28 needs kx    Number of Visits  35    Date for PT Re-Evaluation  08/25/16    PT Start Time  4235    PT Stop Time  1100    PT Time Calculation (min)  45 min    Activity Tolerance  Patient tolerated treatment well    Behavior During Therapy  [facility] for tasks assessed/performed       Past Medical History:  Diagnosis Date  . Bipolar 1 disorder ([employer])   . Cervical cancer ([place])   . GERD (gastroesophageal reflux disease)   . History of tracheostomy 03/04/2017   She had trach placed during surgery and reversed before discharge [facility])  . Hypertension   . Hypothyroid   . Manic depressive disorder ([place])   . Tongue cancer [facility])     Past Surgical History:  Procedure Laterality Date  . bone-skin graft     free osteocutaneous flap with microvascular anastomosis: other than iliac crest, metatarsal or great toe.   . cervical lymphadenectomy  03/04/2017   (modified radical neck dissection) [facility].   . EXCISION OF TONGUE LESION Right 03/04/2017   PR excis tongue, mouth, jaw. [facility])  . exploration, carotid  Left 03/04/2017   [facility].   . GLOSSECTOMY  03/04/2017   Glossectomy; composite WO Radical neck dissect [facility])  . LARYNGOSCOPY  11/22/2016   Laryngoscopy, direct, operative with biopsy, [place]  03/04/2017   Reconstruct mandible/ maxil subperiosteal implant, [facility]  . PARTIAL GLOSSECTOMY    . SKIN FULL THICKNESS GRAFT  03/04/2017   [facility]    . VAGINAL HYSTERECTOMY      There were no vitals filed for this  visit.  Subjective Assessment - 07/31/17 1206    Subjective  Pt went to see her surgeon yesterday.  He is very happy with her progress and wants her to continue PT.  He feels that her neck pain may be coming from a pinched nerve in her neck and will send a script to have PT for that     Pertinent History  chemo from july til sept 28. Oct 15 surgery for neck dissection, removal tongue and jaw with reconsturction with artery, skin and fibula from left leg. She has skin graft on left leg and has pain and swelling in leg.  She had physical therapy at home up until last week and is still getting home health RN for wound care  they took 61 lymph nodes and she has had swelling and decreased range of motion of her nedk and right arm     Patient Stated Goals  to get full range of motion of neck and shoulder and get rid of pain in leg and get help with swelling in her neck     Currently in Pain?  No/denies                      [facility] Adult PT Treatment/Exercise - 07/31/17 0001      Manual Therapy   Edema Management  checked jovi pak  fit and added small foam chip pack for extra pressure to fulness just below chin     Soft tissue mobilization  With pt leanin forward onto table with face supported on blue neck massage pillow provided soft tissue work to bilateral upper traps and paracervical muscles with extra time and prolonged pressure on mulitple trigger points       Kinesiotix   Edema  skin kote, the 4 fingered prong tape from lateral cheek, chin to back of neck                PT Short Term Goals - 06/27/17 1313      PT SHORT TERM GOAL #1   Title  Pt will be independent in self manual lymph draiange and use of compression to manage neck lymphedema     Baseline  pt is able to do it at home, but gets more effect from the treatment here     Time  8    Period  Weeks    Status  On-going      PT SHORT TERM GOAL #2   Title  Pt will be independent in a home exercise program for  strength of UE and LE  and neck range of motion     Status  Achieved      PT SHORT TERM GOAL #3   Title  Pt will increase active range of motion of right atm to 130 indicating increase in UE strength so that she can perfom household chores easier     Baseline  150    Status  Achieved      PT SHORT TERM GOAL #4   Title  Pt will report a decrease in overall pain by 25%    Status  Achieved      PT SHORT TERM GOAL #5   Title  Pt will increase right grip strength by an average of 10#     Status  Achieved        PT Long Term Goals - 06/27/17 1315      PT LONG TERM GOAL #1   Title  Pt will decrease quick DASH score to less than 38 indicating a functional improvment in left arm use     Baseline  63.64 on eval,  31.82 on 06/27/2017    Status  Achieved      PT LONG TERM GOAL #2   Title  Pt will increase 30 second sit to stand by 2 repetitions showing improvment in functional strength    Status  Achieved      PT LONG TERM GOAL #3   Title  pt will decrease TUG score by 2 seconds indicating an improvement in functional gait and balance.     Baseline  6.91 sec on eval 5.79 sec on 06/27/2017     Status  Partially Met      PT LONG TERM GOAL #4   Title  Pt will report a decrease in pain by 50%     Status  Achieved      PT LONG TERM GOAL #5   Title  Pt will increase the strength of right shoudler flexion and abduction to 4/5     Time  8    Period  Weeks    Status  New            Plan - 07/31/17 1212    Clinical Impression Statement  Jovi pak fits well but will benefit from extra foam chips to fill up  space under chin.  Pt got some decrease in muscle tightness and trigger points.  She continues to have increased tightness in right neck and upper traps with ropy texture that was reduced somewhat but remained after treatment.  She has less, on left side, but trigger points are still present  Will do more add specific goals and update plan for neck treatment on next recert when new  referral received     Clinical Impairments Affecting Rehab Potential  extensive surgery ; completed radiation 06/18/17    PT Frequency  2x / week    PT Treatment/Interventions  ADLs/Self Care Home Management;Therapeutic activities;Therapeutic exercise;Taping;Manual techniques;Manual lymph drainage;Compression bandaging;Passive range of motion;Scar mobilization;Patient/family education;Functional mobility training;DME Instruction;Balance training;Neuromuscular re-education;Gait training;Stair training    PT Next Visit Plan  see if pt wearing jovi pak/is it comfortable and helping?continue with soft tissue work to neck and prone scapular work (review exs issued today to assess technique), UBE, and 3 way raises (and issue handout) to HEP, then continue with MLD to face and neck, use kinesiotape and review with pt to be able to cont at home.     Consulted and Agree with Plan of Care  Patient       Patient will benefit from skilled therapeutic intervention in order to improve the following deficits and impairments:  Abnormal gait, Decreased endurance, Decreased skin integrity, Impaired sensation, Increased edema, Decreased scar mobility, Decreased knowledge of precautions, Decreased activity tolerance, Decreased knowledge of use of DME, Decreased strength, Increased fascial restricitons, Impaired UE functional use, Pain, Difficulty walking, Decreased mobility, Decreased balance, Decreased range of motion, Impaired perceived functional ability, Postural dysfunction  Visit Diagnosis: Aftercare following surgery for neoplasm  Lymphedema, not elsewhere classified  Acute pain of right shoulder  Muscle weakness (generalized)     Problem List Patient Active Problem List   Diagnosis Date Noted  . 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 Squamous cell carcinoma of tongue ([place]) 04/23/2017   Donato Heinz. Owens Shark PT  [place] 07/31/2017, 12:17 PM  [place], [place], [address] Phone: [phone]   Fax:  [phone]  Name: Nancy [name] MRN: [id number] Date of Birth: [birth year]

## 2017-08-05 ENCOUNTER — Telehealth: Payer: Self-pay

## 2017-08-05 ENCOUNTER — Ambulatory Visit: Payer: Medicare Other

## 2017-08-05 ENCOUNTER — Inpatient Hospital Stay: Payer: Medicare Other | Attending: Radiation Oncology | Admitting: Nutrition

## 2017-08-05 ENCOUNTER — Other Ambulatory Visit: Payer: Self-pay | Admitting: Radiation Oncology

## 2017-08-05 DIAGNOSIS — C021 Malignant neoplasm of border of tongue: Secondary | ICD-10-CM

## 2017-08-05 DIAGNOSIS — M25511 Pain in right shoulder: Secondary | ICD-10-CM

## 2017-08-05 DIAGNOSIS — Z483 Aftercare following surgery for neoplasm: Secondary | ICD-10-CM | POA: Diagnosis not present

## 2017-08-05 DIAGNOSIS — R1312 Dysphagia, oropharyngeal phase: Secondary | ICD-10-CM

## 2017-08-05 DIAGNOSIS — I890 Lymphedema, not elsewhere classified: Secondary | ICD-10-CM

## 2017-08-05 DIAGNOSIS — C029 Malignant neoplasm of tongue, unspecified: Secondary | ICD-10-CM

## 2017-08-05 MED ORDER — GABAPENTIN 300 MG PO CAPS: 300.0000 mg | ORAL_CAPSULE | Freq: Three times a day (TID) | ORAL | 0 refills | Status: DC

## 2017-08-05 MED FILL — GABAPENTIN 300 MG CAPSULE: 300 | 30 days supply | Qty: 90 | Fill #0

## 2017-08-05 NOTE — Progress Notes (Signed)
Nutrition follow-up completed with patient status post treatment for tongue cancer. Weight improved and documented as 171.8 pounds.  Increased from 168 pounds March 5. Patient is eating a variety of foods and textures. She has discontinued Ensure Plus. She is drinking 8 - 16 ounce bottles of water a day. Taste alterations are improving.  Nutrition diagnosis: Inadequate oral intake has resolved.  I have encouraged patient to continue strategies for adequate calorie and protein intake for weight maintenance and healing. I have discouraged her from trying to lose weight during this healing process.  I have encouraged patient to contact me with questions or concerns.  No follow-up needed.  **Disclaimer: This note was dictated with voice recognition software. Similar sounding words can inadvertently be transcribed and this note may contain transcription errors which may not have been corrected upon publication of note.**

## 2017-08-05 NOTE — Therapy (Signed)
[facility] [address] [place], [place], [address] Phone: [phone]   Fax:  [phone]  Speech Language Pathology Treatment  Patient Details  Name: Nancy [name] MRN: [id number] Date of Birth: 1961/03/12 Referring Provider: Eppie Gibson, MD   Encounter Date: 08/05/2017  End of Session - 08/05/17 1230    Visit Number  4    Number of Visits  5    Date for SLP Re-Evaluation  09/12/17    SLP Start Time  1140    SLP Stop Time   1206    SLP Time Calculation (min)  26 min    Activity Tolerance  Patient tolerated treatment well       Past Medical History:  Diagnosis Date  . Bipolar 1 disorder ([place])   . Cervical cancer ([place])   . GERD (gastroesophageal reflux disease)   . History of tracheostomy 03/04/2017   She had trach placed during surgery and reversed before discharge [facility])  . Hypertension   . Hypothyroid   . Manic depressive disorder ([place])   . Tongue cancer [facility])     Past Surgical History:  Procedure Laterality Date  . bone-skin graft     free osteocutaneous flap with microvascular anastomosis: other than iliac crest, metatarsal or great toe.   . cervical lymphadenectomy  03/04/2017   (modified radical neck dissection) [facility].   . EXCISION OF TONGUE LESION Right 03/04/2017   PR excis tongue, mouth, jaw. [facility])  . exploration, carotid  Left 03/04/2017   [facility].   . GLOSSECTOMY  03/04/2017   Glossectomy; composite WO Radical neck dissect [facility])  . LARYNGOSCOPY  11/22/2016   Laryngoscopy, direct, operative with biopsy, [place]  03/04/2017   Reconstruct mandible/ [facility]il [facility]per[facility]al implant, [facility]  . PARTIAL GLOSSECTOMY    . SKIN FULL THICKNESS GRAFT  03/04/2017   [place]    . VAGINAL HYSTERECTOMY      There were no vitals filed for this visit.  Subjective Assessment - 08/05/17 1149    Subjective  Pt had fried chicken biscuits and gravy this morning.    Currently in Pain?  No/denies            ADULT SLP TREATMENT - 08/05/17 1151      General Information   Behavior/Cognition  Alert;Cooperative;Pleasant mood      Treatment Provided   Treatment provided  Dysphagia      Dysphagia Treatment   Temperature Spikes Noted  No    Respiratory Status  Room air    Oral Cavity - Dentition  Missing dentition    Treatment Methods  Skilled observation;Therapeutic exercise;Patient/caregiver education    Patient observed directly with PO's  Yes    Type of PO's observed  Dysphagia 3 (soft);Thin liquids    Liquids provided via  Cup    Oral Phase Signs & Symptoms  Prolonged bolus formation    Pharyngeal Phase Signs & Symptoms  -- none reported    Other treatment/comments  Pt admits to noncompliance. SLP reminded pt of rationale for exercises, pt completed exercises with SBA. Pt stated she would complete exericses more frequently in the interim. SLP told pt she could likely be seen in 2 monhts due to progress, if she promised to complete HEP as prescribed. Pt did so.       Assessment / Recommendations / Plan   Plan  Continue with current plan of care  Dysphagia Recommendations   Diet recommendations  Regular;Dysphagia 3 (mechanical soft);Thin liquid    Liquids provided via  Cup    Medication Administration  Whole meds with liquid    Supervision  Patient able to self feed      Progression Toward Goals   Progression toward goals  Progressing toward goals       SLP Education - 08/05/17 1229    Education provided  Yes    Education Details  rationale for HEP, when to decr to x2-3/week completion    Person(s) Educated  Patient    Methods  Explanation    Comprehension  Verbalized understanding       SLP Short Term Goals - 06/10/17 1014      SLP SHORT TERM GOAL #1   Title  pt will complete HEP with rare min A    Status  Achieved      SLP SHORT TERM GOAL #2   Title   pt will tell SLP why she is completing HEP     Status  Achieved      SLP SHORT TERM GOAL #3   Title  pt will follow compensations of lt-sided mastication and lt-sided oral transit with POs    Status  Achieved      SLP SHORT TERM GOAL #4   Title  pt will tell SLP 3 overt s/s aspiration PNA with modified independence    Status  Achieved       SLP Long Term Goals - 08/05/17 1233      SLP LONG TERM GOAL #1   Title  pt will complete HEP with modified independence over two visits    Baseline  LTGs renewed 07-08-17;  08-05-17    Time  1    Period  -- visits    Status  On-going and ongoing      SLP LONG TERM GOAL #2   Title  pt will tell how a food journal can expedite return to more normalized diet    Time  1    Period  -- visits    Status  Deferred due to pt returning to almost normalized diet, per her report      SLP LONG TERM GOAL #3   Title  pt will tell SLP 3 overt s/s aspiration PNA over 2 visits    Status  Achieved and ongoing       Plan - 08/05/17 1230    Clinical Impression Statement  Pt with oropharyngeal swallowing continues essentially WNL with soft foods as eaten today. She reports an even wider variety of foods consumed than session one month ago. No overt s/s aspiration today, however pt continues to admit to suboptimal compliance with HEP. SLP reminded pt of rationale for HEP; pt stated she would be better until next visit in two months. See "skiled intervention" for more details. The probability of further swallowing difficulty increases dramatically with the completion of radiation therapy. Pt will need to cont to be followed by SLP for regular assessment of accurate HEP completion as well as for safety with POs both during and following treatment/s.    Speech Therapy Frequency  -- approx once every 4 weeks    Duration  -- 5 total visit    Treatment/Interventions  Aspiration precaution training;Pharyngeal strengthening exercises;Diet toleration management by SLP;Trials  of upgraded texture/liquids;Internal/external aids;Patient/family education;Compensatory strategies;SLP instruction and feedback;Environmental controls       Patient will benefit from skilled therapeutic intervention in order to improve the following  deficits and impairments:   Oropharyngeal dysphagia    Problem List Patient Active Problem List   Diagnosis Date Noted  . 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 Squamous cell carcinoma of tongue ([place]) 04/23/2017    [place]  08/05/2017, 12:35 PM  [facility] [address] [place], [place], [address] Phone: [phone]   Fax:  [phone]   Name: Nancy [name] MRN: [id number] Date of Birth: 1961/02/02

## 2017-08-05 NOTE — Therapy (Signed)
[place], [place], [address] Phone: [phone]   Fax:  [phone]  Physical Therapy Treatment  Patient Details  Name: Nancy [name] MRN: [id number] Date of Birth: 03-Mar-1961 Referring Provider: Dr. Colin Benton    Encounter Date: 08/05/2017  PT End of Session - 08/05/17 1059    Visit Number  101 KX    Number of Visits  35    Date for PT Re-Evaluation  08/25/16    PT Start Time  1018    PT Stop Time  1103    PT Time Calculation (min)  45 min    Activity Tolerance  Patient tolerated treatment well    Behavior During Therapy  [facility] for tasks assessed/performed       Past Medical History:  Diagnosis Date  . Bipolar 1 disorder ([place])   . Cervical cancer ([place])   . GERD (gastroesophageal reflux disease)   . History of tracheostomy 03/04/2017   She had trach placed during surgery and reversed before discharge [facility])  . Hypertension   . Hypothyroid   . Manic depressive disorder ([place])   . Tongue cancer [facility])     Past Surgical History:  Procedure Laterality Date  . bone-skin graft     free osteocutaneous flap with microvascular anastomosis: other than iliac crest, metatarsal or great toe.   . cervical lymphadenectomy  03/04/2017   (modified radical neck dissection) [facility].   . EXCISION OF TONGUE LESION Right 03/04/2017   PR excis tongue, mouth, jaw. [facility])  . exploration, carotid  Left 03/04/2017   [facility].   . GLOSSECTOMY  03/04/2017   Glossectomy; composite WO Radical neck dissect [facility])  . LARYNGOSCOPY  11/22/2016   Laryngoscopy, direct, operative with biopsy, [place]  03/04/2017   Reconstruct mandible/ [facility], [facility]  . PARTIAL GLOSSECTOMY    . SKIN FULL THICKNESS GRAFT  03/04/2017   [place]    . VAGINAL HYSTERECTOMY      There were no vitals filed for this  visit.  Subjective Assessment - 08/05/17 1023    Subjective  My neck and Lt arm felt great after Helene Kelp worked on me last time. But then I overworked myself in the garden and then waxed my floors. I know it was too much but I becoame maniac with my bipolar and just did too much. The Jovi Pak worked really well on my swelling, it was gone!    Pertinent History  chemo from july til sept 28. Oct 15 surgery for neck dissection, removal tongue and jaw with reconsturction with artery, skin and fibula from left leg. She has skin graft on left leg and has pain and swelling in leg.  She had physical therapy at home up until last week and is still getting home health RN for wound care  they took 61 lymph nodes and she has had swelling and decreased range of motion of her nedk and right arm     Patient Stated Goals  to get full range of motion of neck and shoulder and get rid of pain in leg and get help with swelling in her neck     Currently in Pain?  No/denies                      [facility] Adult PT Treatment/Exercise - 08/05/17 0001      Shoulder Exercises: Stretch  Corner Stretch  3 reps;20 seconds    Other Shoulder Stretches  Thoracic extension stretch over chair and then with towel in standing      Manual Therapy   Soft tissue mobilization  With pt leaning forward onto table with face supported on blue neck massage pillow provided soft tissue work to bilateral upper traps and paracervical muscles with extra time and prolonged pressure on mulitple trigger points              PT Education - 08/05/17 1231    Education provided  Yes    Education Details  Doorway pectoralis stretch bil and unilaterally    Person(s) Educated  Patient    Methods  Explanation;Demonstration;Handout    Comprehension  Verbalized understanding;Returned demonstration       PT Short Term Goals - 06/27/17 1313      PT SHORT TERM GOAL #1   Title  Pt will be independent in self manual lymph draiange and  use of compression to manage neck lymphedema     Baseline  pt is able to do it at home, but gets more effect from the treatment here     Time  8    Period  Weeks    Status  On-going      PT SHORT TERM GOAL #2   Title  Pt will be independent in a home exercise program for strength of UE and LE  and neck range of motion     Status  Achieved      PT SHORT TERM GOAL #3   Title  Pt will increase active range of motion of right atm to 130 indicating increase in UE strength so that she can perfom household chores easier     Baseline  150    Status  Achieved      PT SHORT TERM GOAL #4   Title  Pt will report a decrease in overall pain by 25%    Status  Achieved      PT SHORT TERM GOAL #5   Title  Pt will increase right grip strength by an average of 10#     Status  Achieved        PT Long Term Goals - 06/27/17 1315      PT LONG TERM GOAL #1   Title  Pt will decrease quick DASH score to less than 38 indicating a functional improvment in left arm use     Baseline  63.64 on eval,  31.82 on 06/27/2017    Status  Achieved      PT LONG TERM GOAL #2   Title  Pt will increase 30 second sit to stand by 2 repetitions showing improvment in functional strength    Status  Achieved      PT LONG TERM GOAL #3   Title  pt will decrease TUG score by 2 seconds indicating an improvement in functional gait and balance.     Baseline  6.91 sec on eval 5.79 sec on 06/27/2017     Status  Partially Met      PT LONG TERM GOAL #4   Title  Pt will report a decrease in pain by 50%     Status  Achieved      PT LONG TERM GOAL #5   Title  Pt will increase the strength of right shoudler flexion and abduction to 4/5     Time  8    Period  Weeks    Status  New            Plan - 08/05/17 1224    Clinical Impression Statement  Pt wore her JoviPak some since lsat visit (forgot to every day) and reports her chin swelling was gone after wearing it. Her Lr arm pain and neck tightness was much improved after  last visits for a few days until pt overworked herself with gardening and waxing her floors. Continued with manual therapy today to help continue to reduce triger points and neck stiffness which pt reported was much improved after session.    Rehab Potential  Good    Clinical Impairments Affecting Rehab Potential  extensive surgery ; completed radiation 06/18/17    PT Frequency  2x / week    PT Duration  8 weeks    PT Treatment/Interventions  ADLs/Self Care Home Management;Therapeutic activities;Therapeutic exercise;Taping;Manual techniques;Manual lymph drainage;Compression bandaging;Passive range of motion;Scar mobilization;Patient/family education;Functional mobility training;DME Instruction;Balance training;Neuromuscular re-education;Gait training;Stair training    PT Next Visit Plan  Continue with soft tissue work to neck and prone scapular work, UBE, and 3 way raises (and issue handout) to HEP, then continue with MLD to face and neck prn, use kinesiotape and review with pt to be able to cont at home.     Consulted and Agree with Plan of Care  Patient       Patient will benefit from skilled therapeutic intervention in order to improve the following deficits and impairments:  Abnormal gait, Decreased endurance, Decreased skin integrity, Impaired sensation, Increased edema, Decreased scar mobility, Decreased knowledge of precautions, Decreased activity tolerance, Decreased knowledge of use of DME, Decreased strength, Increased fascial restricitons, Impaired UE functional use, Pain, Difficulty walking, Decreased mobility, Decreased balance, Decreased range of motion, Impaired perceived functional ability, Postural dysfunction  Visit Diagnosis: Aftercare following surgery for neoplasm  Lymphedema, not elsewhere classified  Acute pain of right shoulder     Problem List Patient Active Problem List   Diagnosis Date Noted  . 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 Squamous  cell carcinoma of floor of mouth ([place]) 04/23/2017  . Squamous cell carcinoma of tongue ([facility]) 04/23/2017    Otelia Limes, PTA 08/05/2017, 12:34 PM  [place], [place], [address] Phone: [phone]   Fax:  [phone]  Name: Nancy [name] MRN: [id number] Date of Birth: Oct 04, 1960

## 2017-08-05 NOTE — Patient Instructions (Signed)
CHEST: Doorway, Bilateral - Standing    Standing in doorway with one foot in front of other, place hands on wall with elbows bent at shoulder height. Lean forward. Hold _20__ seconds. Then can try with just Lt arm in doorway and turning body away from doorframe. _3-5__ reps per set, _3__ sets per day.   Cancer Rehab [phone]

## 2017-08-05 NOTE — Telephone Encounter (Signed)
I called and informed Nancy [name] that Dr. Isidore Moos has refilled her gabapentin prescribed as requested, and it should be ready for her at [place]. She voiced her appreciation.

## 2017-08-07 ENCOUNTER — Encounter: Payer: Self-pay | Admitting: Physical Therapy

## 2017-08-12 ENCOUNTER — Ambulatory Visit: Payer: Medicare Other

## 2017-08-12 DIAGNOSIS — Z483 Aftercare following surgery for neoplasm: Secondary | ICD-10-CM | POA: Diagnosis not present

## 2017-08-12 DIAGNOSIS — M25511 Pain in right shoulder: Secondary | ICD-10-CM

## 2017-08-12 NOTE — Therapy (Signed)
[place], [place], [address] Phone: [phone]   Fax:  [phone]  Physical Therapy Treatment  Patient Details  Name: Nancy [name] MRN: [id number] Date of Birth: 07-Jul-1960 Referring Provider: Dr. Colin Benton    Encounter Date: 08/12/2017  PT End of Session - 08/12/17 1102    Visit Number  [address] [place]    Number of Visits  35    Date for PT Re-Evaluation  08/25/16    PT Start Time  0938    PT Stop Time  1101    PT Time Calculation (min)  46 min    Activity Tolerance  Patient tolerated treatment well    Behavior During Therapy  [facility] for tasks assessed/performed       Past Medical History:  Diagnosis Date  . Bipolar 1 disorder ([place])   . Cervical cancer ([place])   . GERD (gastroesophageal reflux disease)   . History of tracheostomy 03/04/2017   She had trach placed during surgery and reversed before discharge [facility])  . Hypertension   . Hypothyroid   . Manic depressive disorder ([place])   . Tongue cancer [facility])     Past Surgical History:  Procedure Laterality Date  . bone-skin graft     free osteocutaneous flap with microvascular anastomosis: other than iliac crest, metatarsal or great toe.   . cervical lymphadenectomy  03/04/2017   (modified radical neck dissection) [facility].   . EXCISION OF TONGUE LESION Right 03/04/2017   PR excis tongue, mouth, jaw. [facility])  . exploration, carotid  Left 03/04/2017   [facility].   . GLOSSECTOMY  03/04/2017   Glossectomy; composite WO Radical neck dissect [facility])  . LARYNGOSCOPY  11/22/2016   Laryngoscopy, direct, operative with biopsy, [place]  03/04/2017   Reconstruct mandible/ maxil subperiosteal implant, [facility]  . PARTIAL GLOSSECTOMY    . SKIN FULL THICKNESS GRAFT  03/04/2017   [place]    . VAGINAL HYSTERECTOMY      There were no vitals filed for this  visit.  Subjective Assessment - 08/12/17 1027    Subjective  My neck and Lt arm are so much better than they were. I drove to [facility] since I was here last and overall I feel really good. My Lt upper arm is doing better, just a dull, ache now when it bothers me (is intermittent now), it's not the sharp, shooting pain it was. And my neck stiffness is so much improved as well from the stretching.     Pertinent History  chemo from july til sept 28. Oct 15 surgery for neck dissection, removal tongue and jaw with reconsturction with artery, skin and fibula from left leg. She has skin graft on left leg and has pain and swelling in leg.  She had physical therapy at home up until last week and is still getting home health RN for wound care  they took 68 lymph nodes and she has had swelling and decreased range of motion of her nedk and right arm     Patient Stated Goals  to get full range of motion of neck and shoulder and get rid of pain in leg and get help with swelling in her neck     Currently in Pain?  No/denies                No data recorded       [facility]  Adult PT Treatment/Exercise - 08/12/17 0001      Manual Therapy   Soft tissue mobilization  With pt leaning forward onto table with face supported on blue neck massage pillow provided soft tissue work to bilateral upper traps and paracervical muscles with extra time and prolonged pressure on mulitple trigger points                PT Short Term Goals - 06/27/17 1313      PT SHORT TERM GOAL #1   Title  Pt will be independent in self manual lymph draiange and use of compression to manage neck lymphedema     Baseline  pt is able to do it at home, but gets more effect from the treatment here     Time  8    Period  Weeks    Status  On-going      PT SHORT TERM GOAL #2   Title  Pt will be independent in a home exercise program for strength of UE and LE  and neck range of motion     Status  Achieved      PT SHORT TERM GOAL #3    Title  Pt will increase active range of motion of right atm to 130 indicating increase in UE strength so that she can perfom household chores easier     Baseline  150    Status  Achieved      PT SHORT TERM GOAL #4   Title  Pt will report a decrease in overall pain by 25%    Status  Achieved      PT SHORT TERM GOAL #5   Title  Pt will increase right grip strength by an average of 10#     Status  Achieved        PT Long Term Goals - 06/27/17 1315      PT LONG TERM GOAL #1   Title  Pt will decrease quick DASH score to less than 38 indicating a functional improvment in left arm use     Baseline  63.64 on eval,  31.82 on 06/27/2017    Status  Achieved      PT LONG TERM GOAL #2   Title  Pt will increase 30 second sit to stand by 2 repetitions showing improvment in functional strength    Status  Achieved      PT LONG TERM GOAL #3   Title  pt will decrease TUG score by 2 seconds indicating an improvement in functional gait and balance.     Baseline  6.91 sec on eval 5.79 sec on 06/27/2017     Status  Partially Met      PT LONG TERM GOAL #4   Title  Pt will report a decrease in pain by 50%     Status  Achieved      PT LONG TERM GOAL #5   Title  Pt will increase the strength of right shoudler flexion and abduction to 4/5     Time  8    Period  Weeks    Status  New            Plan - 08/12/17 1102    Clinical Impression Statement  Pt had a good visit to see her brothre in [place], [place] over weekend. She reports was overall very pleased with how she felt. She did her neck ROM stretches throughout day and reports overall her neck tightness is much improved as is her  Lt arm pain. She is up for either D/R or renewal next week so started discussing with pt today overall how she is feeling with progress. Pt concedes overall doing much better but may be fearful of stopping therapy completely. Will continue discussing this with PT Wed. Maybe consider decreasing to 1x/wk...?    Rehab  Potential  Good    Clinical Impairments Affecting Rehab Potential  extensive surgery ; completed radiation 06/18/17    PT Frequency  2x / week    PT Duration  8 weeks    PT Treatment/Interventions  ADLs/Self Care Home Management;Therapeutic activities;Therapeutic exercise;Taping;Manual techniques;Manual lymph drainage;Compression bandaging;Passive range of motion;Scar mobilization;Patient/family education;Functional mobility training;DME Instruction;Balance training;Neuromuscular re-education;Gait training;Stair training    PT Next Visit Plan  Discuss progress and possibility of plan as pt has no appointments after this week (order was sent for renewal from [place] and this prompted discussion). Continue with soft tissue work to neck and prone scapular work, UBE, and 3 way raises (and issue handout) to HEP, then continue with MLD to face and neck prn, use kinesiotape and review with pt to be able to cont at home.     Consulted and Agree with Plan of Care  Patient       Patient will benefit from skilled therapeutic intervention in order to improve the following deficits and impairments:  Abnormal gait, Decreased endurance, Decreased skin integrity, Impaired sensation, Increased edema, Decreased scar mobility, Decreased knowledge of precautions, Decreased activity tolerance, Decreased knowledge of use of DME, Decreased strength, Increased fascial restricitons, Impaired UE functional use, Pain, Difficulty walking, Decreased mobility, Decreased balance, Decreased range of motion, Impaired perceived functional ability, Postural dysfunction  Visit Diagnosis: Aftercare following surgery for neoplasm  Acute pain of right shoulder     Problem List Patient Active Problem List   Diagnosis Date Noted  . 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 Squamous cell carcinoma of tongue ([facility]) 04/23/2017    Otelia Limes, PTA 08/12/2017, 11:07 AM  [place], [place], [address] Phone: [phone]   Fax:  [phone]  Name: Nancy [name] MRN: [id number] Date of Birth: [birth year]

## 2017-08-14 ENCOUNTER — Ambulatory Visit: Payer: Medicare Other | Admitting: Physical Therapy

## 2017-08-14 DIAGNOSIS — Z483 Aftercare following surgery for neoplasm: Secondary | ICD-10-CM | POA: Diagnosis not present

## 2017-08-14 DIAGNOSIS — I890 Lymphedema, not elsewhere classified: Secondary | ICD-10-CM

## 2017-08-14 DIAGNOSIS — M6281 Muscle weakness (generalized): Secondary | ICD-10-CM

## 2017-08-14 DIAGNOSIS — M25511 Pain in right shoulder: Secondary | ICD-10-CM

## 2017-08-15 ENCOUNTER — Encounter: Payer: Self-pay | Admitting: Physical Therapy

## 2017-08-15 NOTE — Therapy (Signed)
[place], [place], [address] Phone: [phone]   Fax:  [phone]  Physical Therapy Treatment  Patient Details  Name: Nancy [name] MRN: [id number] Date of Birth: 05-Dec-1960 Referring Provider: Dr. Colin Benton    Encounter Date: 08/14/2017  PT End of Session - 08/15/17 0957    Visit Number  31 kx    Number of Visits  35    Date for PT Re-Evaluation  08/25/16    PT Start Time  1016    PT Stop Time  1100    PT Time Calculation (min)  44 min    Activity Tolerance  Patient tolerated treatment well    Behavior During Therapy  [facility] for tasks assessed/performed       Past Medical History:  Diagnosis Date  . Bipolar 1 disorder ([place])   . Cervical cancer ([place])   . GERD (gastroesophageal reflux disease)   . History of tracheostomy 03/04/2017   She had trach placed during surgery and reversed before discharge [facility])  . Hypertension   . Hypothyroid   . Manic depressive disorder ([place])   . Tongue cancer [facility])     Past Surgical History:  Procedure Laterality Date  . bone-skin graft     free osteocutaneous flap with microvascular anastomosis: other than iliac crest, metatarsal or great toe.   . cervical lymphadenectomy  03/04/2017   (modified radical neck dissection) [facility].   . EXCISION OF TONGUE LESION Right 03/04/2017   PR excis tongue, mouth, jaw. [facility])  . exploration, carotid  Left 03/04/2017   [facility].   . GLOSSECTOMY  03/04/2017   Glossectomy; composite WO Radical neck dissect [facility])  . LARYNGOSCOPY  11/22/2016   Laryngoscopy, direct, operative with biopsy, [place]  03/04/2017   Reconstruct mandible/ maxil subperiosteal implant, [facility]  . PARTIAL GLOSSECTOMY    . SKIN FULL THICKNESS GRAFT  03/04/2017   [place]    . VAGINAL HYSTERECTOMY      There were no vitals filed for this  visit.  Subjective Assessment - 08/15/17 0953    Subjective  Pt reports she is having more pain in her neck and arm today. She thinks it may have to do with her bed and pillow since she had less pain when she was in [facility]  She wants to have massage and neck traction today as she thinks it relieves it.     Pertinent History  chemo from july til sept 28. Oct 15 surgery for neck dissection, removal tongue and jaw with reconsturction with artery, skin and fibula from left leg. She has skin graft on left leg and has pain and swelling in leg.  She had physical therapy at home up until last week and is still getting home health RN for wound care  they took 72 lymph nodes and she has had swelling and decreased range of motion of her nedk and right arm     Patient Stated Goals  to get full range of motion of neck and shoulder and get rid of pain in leg and get help with swelling in her neck     Currently in Pain?  Yes    Pain Score  -- did not rate     Pain Location  Arm    Pain Orientation  Right    Pain Radiating Towards  toward upper arm  No data recorded       [facility] Adult PT Treatment/Exercise - 08/15/17 0001      Self-Care   Other Self-Care Comments   discussed Flexitouch to help pt manage lymphedema at home, She authorzed me to send demographics to [employer]       Manual Therapy   Soft tissue mobilization  With pt leaning forward onto table with face supported on blue neck massage pillow provided soft tissue work to bilateral upper traps and paracervical muscles with extra time and prolonged pressure on mulitple trigger points     Myofascial Release  to paracervical muscles  with prolonged pressure to trigger points especially on the right side     Manual Traction  to cervical area                PT Short Term Goals - 06/27/17 1313      PT SHORT TERM GOAL #1   Title  Pt will be independent in self manual lymph draiange and use of compression to manage neck  lymphedema     Baseline  pt is able to do it at home, but gets more effect from the treatment here     Time  8    Period  Weeks    Status  On-going      PT SHORT TERM GOAL #2   Title  Pt will be independent in a home exercise program for strength of UE and LE  and neck range of motion     Status  Achieved      PT SHORT TERM GOAL #3   Title  Pt will increase active range of motion of right atm to 130 indicating increase in UE strength so that she can perfom household chores easier     Baseline  150    Status  Achieved      PT SHORT TERM GOAL #4   Title  Pt will report a decrease in overall pain by 25%    Status  Achieved      PT SHORT TERM GOAL #5   Title  Pt will increase right grip strength by an average of 10#     Status  Achieved        PT Long Term Goals - 06/27/17 1315      PT LONG TERM GOAL #1   Title  Pt will decrease quick DASH score to less than 38 indicating a functional improvment in left arm use     Baseline  63.64 on eval,  31.82 on 06/27/2017    Status  Achieved      PT LONG TERM GOAL #2   Title  Pt will increase 30 second sit to stand by 2 repetitions showing improvment in functional strength    Status  Achieved      PT LONG TERM GOAL #3   Title  pt will decrease TUG score by 2 seconds indicating an improvement in functional gait and balance.     Baseline  6.91 sec on eval 5.79 sec on 06/27/2017     Status  Partially Met      PT LONG TERM GOAL #4   Title  Pt will report a decrease in pain by 50%     Status  Achieved      PT LONG TERM GOAL #5   Title  Pt will increase the strength of right shoudler flexion and abduction to 4/5     Time  8    Period  Weeks    Status  New            Plan - 08/15/17 0958    Clinical Impression Statement  Pt reports she knows the jovipak has been helping with her lymphedema but she has not worn it this week.  she is interested in finding out about the Flexitouch to see if it will help her further.  She gets relief  from manual work to help her neck and shoulder pain and wants to focus on that for now.  She has resistant tender tightness in both upper traps and paracervical muscles that seemed to loosen more with traction and myofascial release than massage today     Clinical Impairments Affecting Rehab Potential  extensive surgery ; completed radiation 06/18/17    PT Frequency  2x / week    PT Duration  8 weeks    PT Treatment/Interventions  ADLs/Self Care Home Management;Therapeutic activities;Therapeutic exercise;Taping;Manual techniques;Manual lymph drainage;Compression bandaging;Passive range of motion;Scar mobilization;Patient/family education;Functional mobility training;DME Instruction;Balance training;Neuromuscular re-education;Gait training;Stair training    PT Next Visit Plan  See if we have heard back from [facility].  continue with manual work including myofascial release and manual traction to neck. Add more stretched to neck with towel support to keep shoulder down     Consulted and Agree with Plan of Care  Patient       Patient will benefit from skilled therapeutic intervention in order to improve the following deficits and impairments:  Abnormal gait, Decreased endurance, Decreased skin integrity, Impaired sensation, Increased edema, Decreased scar mobility, Decreased knowledge of precautions, Decreased activity tolerance, Decreased knowledge of use of DME, Decreased strength, Increased fascial restricitons, Impaired UE functional use, Pain, Difficulty walking, Decreased mobility, Decreased balance, Decreased range of motion, Impaired perceived functional ability, Postural dysfunction  Visit Diagnosis: Aftercare following surgery for neoplasm  Acute pain of right shoulder  Lymphedema, not elsewhere classified  Muscle weakness (generalized)     Problem List Patient Active Problem List   Diagnosis Date Noted  . 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 Squamous cell carcinoma of tongue ([place]) 04/23/2017   Donato Heinz. Owens Shark PT  Norwood Levo 08/15/2017, 10:02 AM  [place], [place], [address] Phone: [phone]   Fax:  [phone]  Name: Nancy [name] MRN: [id number] Date of Birth: 1961-03-15

## 2017-08-16 ENCOUNTER — Encounter: Payer: Self-pay | Admitting: Physical Therapy

## 2017-08-16 ENCOUNTER — Ambulatory Visit: Payer: Medicare Other | Admitting: Physical Therapy

## 2017-08-16 DIAGNOSIS — Z483 Aftercare following surgery for neoplasm: Secondary | ICD-10-CM

## 2017-08-16 DIAGNOSIS — M6281 Muscle weakness (generalized): Secondary | ICD-10-CM

## 2017-08-16 DIAGNOSIS — I890 Lymphedema, not elsewhere classified: Secondary | ICD-10-CM

## 2017-08-16 DIAGNOSIS — M25511 Pain in right shoulder: Secondary | ICD-10-CM

## 2017-08-16 NOTE — Patient Instructions (Signed)
Home Exercise Program Login Instructions Try [place] Access your home exercise program with our mobile app for iOS and Android. Search [employer] or [employer] for: "[place]" Open in your Browser To access your program without the app, enter your access code below at: [email or web address] By accessing your home exercise program online you can: View Your Exercise Videos Interactive HD videos guide you with easy to follow instructions. Learn About your Condition Gain a deeper understanding of your condition and the road to a healthy recovery. Track Your Progress Keep track of your activity and progress throughout treatment and post care. Two Ways To Access Your Access Code STEP 1 STEP 2 Supine Cervical Retraction with Towel REPS: 10  SETS: 3  WEEKLY: 7x  DAILY: 1x Setup Begin lying on your back with your neck relaxed. Movement Gently tuck your chin directly backward as if you are making a double chin. Hold, then relax and repeat. Tip Make sure not to lift your head from the ground. STEP 1 Supine Suboccipital Release with Tennis Balls REPS: 10  SETS: 3  WEEKLY: 7x  DAILY: 1x Setup Begin lying on your back with your knees bent and two tennis balls placed together at the base of your head. Movement Hold this position. You should feel a light stretch or muscle release. Tip Make sure keep your neck relaxed during the stretch. Prepared by Maudry Diego Access your exercises! [email or web address] [place] Your Access Code: [email or web address] Disclaimer: This program provides exercises related to your condition that you can perform at home. As there is a risk of injury with any activity, use caution when performing exercises. If you experience any pain or discomfort, discontinue the exercises and contact your health care provider. Page 1 of 6 08/16/2017 STEP 1 STEP 2 Seated Cervical Traction REPS: 10  SETS: 3  WEEKLY: 7x  DAILY:  1x Setup Begin sitting upright holding the ends of a towel that is placed below the back of your head. Movement Gently pull the towel forward and upwards with both hands, until you feel a pressure relief in your neck. Hold, then relax and repeat. Tip Make sure to sit tall and keep your neck relaxed. Do not pull your body or head forward or shrug your shoulders during the exercise. STEP 1 STEP 2 Seated Levator Scapulae Stretch REPS: 2  SETS: 1  HOLD: 30  WEEKLY: 7x  DAILY: 2x Setup Begin sitting upright in a chair, grasping the edge with one hand. Movement Rotate your head to the side opposite your anchored arm, then tuck your chin towards your chest. With your free hand, grasp the back of your head and gently pull it downward until you feel a stretch and hold. Tip Make sure to keep your back straight during the exercise. Prepared by Maudry Diego Access your exercises! [email or web address] [place] Your Access Code: [email or web address] Disclaimer: This program provides exercises related to your condition that you can perform at home. As there is a risk of injury with any activity, use caution when performing exercises. If you experience any pain or discomfort, discontinue the exercises and contact your health care provider. Page 2 of 6 08/16/2017 STEP 1 STEP 2 Seated Upper Trapezius Stretch REPS: 2  SETS: 1  HOLD: 30  WEEKLY: 7x  DAILY: 2x Setup Begin sitting upright on a table grasping the edge with one hand. Movement Rotate your head up and to the side opposite of your anchored arm and slowly lean it toward your shoulder, applying pressure with your hand  until you feel a stretch and hold. Tip Make sure to keep your back straight during the exercise. STEP 1 STEP 2 Standing 'L' Stretch at Counter REPS: 2  SETS: 1  HOLD: 30  WEEKLY: 7x  DAILY: 2x Setup Begin in a standing upright position with your hands resting on a counter. Movement Step your feet backward,  allowing enough space for you to hinge forward at your hips and reach your arms overhead until you feel a stretch in your shoulders and your back. You may also feel the stretch in the back of your legs. Hold this position. Tip Make sure to keep your movements slow and controlled and only move in a pain free range of motion. Prepared by Maudry Diego Access your exercises! [email or web address] [place] Your Access Code: [email or web address] Disclaimer: This program provides exercises related to your condition that you can perform at home. As there is a risk of injury with any activity, use caution when performing exercises. If you experience any pain or discomfort, discontinue the exercises and contact your health care provider. Page 3 of 6 08/16/2017 STEP 1 STEP 2 Supine Chest Stretch on Foam Roll REPS: 2  SETS: 1  HOLD: 30  WEEKLY: 7x  DAILY: 2x Setup Begin lying with your knees bent and a foam roll positioned vertically along the middle of your back, hands resting on your stomach. Movement Slowly move your arms straight out to your sides, then return to the starting position and repeat. Tip Make sure your back is laying flat against the foam roll. Prepared by Maudry Diego Access your exercises! [email or web address] [place] Your Access Code: [email or web address] Disclaimer: This program provides exercises related to your condition that you can perform at home. As there is a risk of injury with any activity, use caution when performing exercises. If you experience any pain or discomfort, discontinue the exercises and contact your health care provider. Page 4 of 6 08/16/2017 Prepared by Maudry Diego Access your exercises! [email or web address] Your Access Code: [email or web address] Disclaimer: This program provides exercises related to your condition that you can perform at home. As there is a risk of injury with any activity, use caution when performing exercises. If you  experience any pain or discomfort, discontinue the exercises and contact your health care provider. Page 5 of 6 08/16/2017 Prepared by Maudry Diego Access your exercises! [email or web address] [place] Your Access Code: [email or web address] Disclaimer: This program provides exercises related to your condition that you can perform at home. As there is a risk of injury with any activity, use caution when performing exercises. If you experience any pain or discomfort, discontinue the exercises and contact your health care provider. Page 6 of 6 08/16/2017

## 2017-08-16 NOTE — Therapy (Signed)
[place], [place], [address] Phone: [phone]   Fax:  [phone]  Physical Therapy Treatment  Patient Details  Name: Nancy [name] MRN: [id number] Date of Birth: 04/04/1961 Referring Provider: Dr. Colin Benton    Encounter Date: 08/16/2017  PT End of Session - 08/16/17 1329    Visit Number  69 kx    Number of Visits  35    Date for PT Re-Evaluation  08/25/16    PT Start Time  0800    PT Stop Time  0845    PT Time Calculation (min)  45 min    Activity Tolerance  Patient tolerated treatment well    Behavior During Therapy  [facility] for tasks assessed/performed       Past Medical History:  Diagnosis Date  . Bipolar 1 disorder ([place])   . Cervical cancer ([place])   . GERD (gastroesophageal reflux disease)   . History of tracheostomy 03/04/2017   She had trach placed during surgery and reversed before discharge [facility])  . Hypertension   . Hypothyroid   . Manic depressive disorder ([place])   . Tongue cancer [facility])     Past Surgical History:  Procedure Laterality Date  . bone-skin graft     free osteocutaneous flap with microvascular anastomosis: other than iliac crest, metatarsal or great toe.   . cervical lymphadenectomy  03/04/2017   (modified radical neck dissection) [facility].   . EXCISION OF TONGUE LESION Right 03/04/2017   PR excis tongue, mouth, jaw. [facility])  . exploration, carotid  Left 03/04/2017   [facility].   . GLOSSECTOMY  03/04/2017   Glossectomy; composite WO Radical neck dissect [facility])  . LARYNGOSCOPY  11/22/2016   Laryngoscopy, direct, operative with biopsy, [place]  03/04/2017   Reconstruct mandible/ maxil subperiosteal implant, [facility]  . PARTIAL GLOSSECTOMY    . SKIN FULL THICKNESS GRAFT  03/04/2017   [place]    . VAGINAL HYSTERECTOMY      There were no vitals filed for this  visit.  Subjective Assessment - 08/16/17 0806    Subjective  'Im weaning off the gabapentin"  This morning I kind of twisted something.     Pertinent History  chemo from july til sept 28. Oct 15 surgery for neck dissection, removal tongue and jaw with reconsturction with artery, skin and fibula from left leg. She has skin graft on left leg and has pain and swelling in leg.  She had physical therapy at home up until last week and is still getting home health RN for wound care  they took 79 lymph nodes and she has had swelling and decreased range of motion of her nedk and right arm     Patient Stated Goals  to get full range of motion of neck and shoulder and get rid of pain in leg and get help with swelling in her neck     Currently in Pain?  Yes    Pain Score  2     Pain Location  Shoulder    Pain Orientation  Left    Pain Descriptors / Indicators  Aching    Pain Type  Chronic pain    Pain Radiating Towards  toward upper arm                 No data recorded       [facility] Adult PT Treatment/Exercise -  08/16/17 0001      Self-Care   Other Self-Care Comments   pt does not have Medicaid coverage for Flex[employer] so demographics were not sent to [employer]       Neck Exercises: Seated   Other Seated Exercise  neck stretches with towlel assist for cervical traction, levator and trapezious stretch,  see medbridge HEP      Neck Exercises: Supine   Other Supine Exercise  stretches with 2 tennis balls at back of neck to provide a stretch to neck      Manual Therapy   Myofascial Release  in supine, to paracervical muscles  with prolonged pressure to trigger points especially on the right side     Manual Traction  to cervical area                PT Short Term Goals - 06/27/17 1313      PT SHORT TERM GOAL #1   Title  Pt will be independent in self manual lymph draiange and use of compression to manage neck lymphedema     Baseline  pt is able to do it at home, but gets more  effect from the treatment here     Time  8    Period  Weeks    Status  On-going      PT SHORT TERM GOAL #2   Title  Pt will be independent in a home exercise program for strength of UE and LE  and neck range of motion     Status  Achieved      PT SHORT TERM GOAL #3   Title  Pt will increase active range of motion of right atm to 130 indicating increase in UE strength so that she can perfom household chores easier     Baseline  150    Status  Achieved      PT SHORT TERM GOAL #4   Title  Pt will report a decrease in overall pain by 25%    Status  Achieved      PT SHORT TERM GOAL #5   Title  Pt will increase right grip strength by an average of 10#     Status  Achieved        PT Long Term Goals - 06/27/17 1315      PT LONG TERM GOAL #1   Title  Pt will decrease quick DASH score to less than 38 indicating a functional improvment in left arm use     Baseline  63.64 on eval,  31.82 on 06/27/2017    Status  Achieved      PT LONG TERM GOAL #2   Title  Pt will increase 30 second sit to stand by 2 repetitions showing improvment in functional strength    Status  Achieved      PT LONG TERM GOAL #3   Title  pt will decrease TUG score by 2 seconds indicating an improvement in functional gait and balance.     Baseline  6.91 sec on eval 5.79 sec on 06/27/2017     Status  Partially Met      PT LONG TERM GOAL #4   Title  Pt will report a decrease in pain by 50%     Status  Achieved      PT LONG TERM GOAL #5   Title  Pt will increase the strength of right shoudler flexion and abduction to 4/5     Time  8  Period  Weeks    Status  New            Plan - 08/16/17 1329    Clinical Impression Statement  Pt reports she is having some pain in her shoulder rather than down her arm. Focus of treatment was spent on instructing in home exercise program and how to get it at home on her computer. Pt was also given a written copy     Rehab Potential  Good    Clinical Impairments Affecting  Rehab Potential  extensive surgery ; completed radiation 06/18/17    PT Treatment/Interventions  ADLs/Self Care Home Management;Therapeutic activities;Therapeutic exercise;Taping;Manual techniques;Manual lymph drainage;Compression bandaging;Passive range of motion;Scar mobilization;Patient/family education;Functional mobility training;DME Instruction;Balance training;Neuromuscular re-education;Gait training;Stair training    PT Next Visit Plan  Assess how she is doing with home program continue with manual work including myofascial release and manual traction to neck. Add more stretched to neck with towel support to keep shoulder down     Consulted and Agree with Plan of Care  Patient       Patient will benefit from skilled therapeutic intervention in order to improve the following deficits and impairments:  Abnormal gait, Decreased endurance, Decreased skin integrity, Impaired sensation, Increased edema, Decreased scar mobility, Decreased knowledge of precautions, Decreased activity tolerance, Decreased knowledge of use of DME, Decreased strength, Increased fascial restricitons, Impaired UE functional use, Pain, Difficulty walking, Decreased mobility, Decreased balance, Decreased range of motion, Impaired perceived functional ability, Postural dysfunction  Visit Diagnosis: Aftercare following surgery for neoplasm  Acute pain of right shoulder  Lymphedema, not elsewhere classified  Muscle weakness (generalized)     Problem List Patient Active Problem List   Diagnosis Date Noted  . 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 Squamous cell carcinoma of tongue ([place]) 04/23/2017   Donato Heinz. Owens Shark PT  Norwood Levo 08/16/2017, 1:32 PM  [place], [place], [address] Phone: [phone]   Fax:  [phone]  Name: COLLEENE SWARTHOUT MRN: [id number] Date of Birth: [birth year]

## 2017-08-21 ENCOUNTER — Encounter: Payer: Self-pay | Admitting: Physical Therapy

## 2017-08-21 ENCOUNTER — Ambulatory Visit: Payer: Medicare Other | Attending: Otolaryngology | Admitting: Physical Therapy

## 2017-08-21 DIAGNOSIS — Z483 Aftercare following surgery for neoplasm: Secondary | ICD-10-CM | POA: Diagnosis present

## 2017-08-21 DIAGNOSIS — M25511 Pain in right shoulder: Secondary | ICD-10-CM | POA: Insufficient documentation

## 2017-08-21 DIAGNOSIS — M6281 Muscle weakness (generalized): Secondary | ICD-10-CM | POA: Diagnosis present

## 2017-08-21 DIAGNOSIS — I890 Lymphedema, not elsewhere classified: Secondary | ICD-10-CM | POA: Diagnosis present

## 2017-08-21 NOTE — Therapy (Signed)
[place], [place], [address] Phone: [phone]   Fax:  [phone]  Physical Therapy Treatment  Patient Details  Name: Nancy [name] MRN: [id number] Date of Birth: 06-03-1960 Referring Provider: Dr.Trevor [place] Callas    Encounter Date: 08/21/2017  PT End of Session - 08/21/17 1212    Visit Number  36 kx    Number of Visits  47    Date for PT Re-Evaluation  10/01/16    PT Start Time  0930    PT Stop Time  1015    PT Time Calculation (min)  45 min    Activity Tolerance  Patient tolerated treatment well    Behavior During Therapy  [facility] for tasks assessed/performed       Past Medical History:  Diagnosis Date  . Bipolar 1 disorder ([place])   . Cervical cancer ([place])   . GERD (gastroesophageal reflux disease)   . History of tracheostomy 03/04/2017   She had trach placed during surgery and reversed before discharge [facility])  . Hypertension   . Hypothyroid   . Manic depressive disorder ([place])   . Tongue cancer [facility])     Past Surgical History:  Procedure Laterality Date  . bone-skin graft     free osteocutaneous flap with microvascular anastomosis: other than iliac crest, metatarsal or great toe.   . cervical lymphadenectomy  03/04/2017   (modified radical neck dissection) [facility].   . EXCISION OF TONGUE LESION Right 03/04/2017   PR excis tongue, mouth, jaw. [facility])  . exploration, carotid  Left 03/04/2017   [facility]   . GLOSSECTOMY  03/04/2017   Glossectomy; composite WO Radical neck dissect [facility])  . LARYNGOSCOPY  11/22/2016   Laryngoscopy, direct, operative with biopsy, [place]  03/04/2017   Reconstruct mandible/ maxil subperiosteal implant, [facility]  . PARTIAL GLOSSECTOMY    . SKIN FULL THICKNESS GRAFT  03/04/2017   [place]    . VAGINAL HYSTERECTOMY      There were no vitals filed for this  visit.  Subjective Assessment - 08/21/17 0942    Subjective  Pt reports she has been doing yoga, but had swelling in her cheek afterward, but had reduction with the garment.  She is having continued pain in her neck and arm that is keeping her from sleeping     Pertinent History  chemo from july til sept 28. Oct 15 surgery for neck dissection, removal tongue and jaw with reconsturction with artery, skin and fibula from left leg. She has skin graft on left leg and has pain and swelling in leg.  She had physical therapy at home up until last week and is still getting home health RN for wound care  they took 42 lymph nodes and she has had swelling and decreased range of motion of her nedk and right arm     Patient Stated Goals  to get full range of motion of neck and shoulder and get rid of pain in leg and get help with swelling in her neck     Currently in Pain?  Yes    Pain Score  3     Pain Location  Shoulder    Pain Orientation  Left    Pain Descriptors / Indicators  Aching         [facility] PT Assessment - 08/21/17 0001      Assessment   Medical Diagnosis  tongue cancer    Referring Provider  Dr.Trevor Hackman     Onset Date/Surgical Date  03/04/17      Prior Function   Level of Independence  Independent                   [facility] Adult PT Treatment/Exercise - 08/21/17 0001      Self-Care   Other Self-Care Comments   showed pt the "chiroflow" pillow from [employer] that may help her sleep with less pain       Exercises   Exercises  Other Exercises    Other Exercises   used 2 mirrors to that pt could see decreased stabalization and winging of righ scapula with active exercise       Lumbar Exercises: Aerobic   Tread Mill  1.5 mph , 1.0 grade x 10 minutes  pt tolerated well with only mild SOB       Shoulder Exercises: Supine   Protraction  Strengthening;Right;10 reps;Weights    Protraction Weight (lbs)  3    Horizontal ABduction  AROM;Strengthening;Right;10 reps;Weights     Horizontal ABduction Weight (lbs)  3    Horizontal ABduction Limitations  very limited range especially when bringing weight back across body     Other Supine Exercises  skull crusher with 3 pounds with effort to stabalize upper arm     Other Supine Exercises  green theraband in both hands with arms pointed to ceiling she stabalized with right arm and horizontally abducted left arm x 10 reps for isometric work of right scapula       Modalities   Modalities  Cryotherapy      Cryotherapy   Number Minutes Cryotherapy  10 Minutes    Cryotherapy Location  Cervical left    Type of Cryotherapy  Ice pack               PT Short Term Goals - 08/21/17 1213      PT SHORT TERM GOAL #1   Title  Pt will be independent in self manual lymph draiange and use of compression to manage neck lymphedema     Baseline  Pt is using her Jovi pak to manage swelling, she does not insurance coverage for Flexitouch     Status  Achieved      PT SHORT TERM GOAL #2   Title  Pt will be independent in a home exercise program for strength of UE and LE  and neck range of motion     Time  6    Status  On-going      PT SHORT TERM GOAL #3   Title  Pt will increase active range of motion of right atm to 130 indicating increase in UE strength so that she can perfom household chores easier     Baseline  150    Status  Achieved      PT SHORT TERM GOAL #4   Title  Pt will report a decrease in overall pain by 25%    Status  Achieved      PT SHORT TERM GOAL #5   Title  Pt will increase right grip strength by an average of 10#     Status  Achieved        PT Long Term Goals - 08/21/17 1214      PT LONG TERM GOAL #1   Title  Pt will decrease quick DASH score to less than 38 indicating a functional improvment in left arm use  Baseline  63.64 on eval,  31.82 on 06/27/2017    Status  Achieved      PT LONG TERM GOAL #2   Title  Pt will increase 30 second sit to stand by 2 repetitions showing improvment in  functional strength    Status  Achieved      PT LONG TERM GOAL #3   Title  pt will decrease TUG score by 2 seconds indicating an improvement in functional gait and balance.     Baseline  6.91 sec on eval 5.79 sec on 06/27/2017     Status  On-going      PT LONG TERM GOAL #4   Title  Pt will report a decrease in pain by 50%     Baseline  revised still is having problems with left shoulder pain     Time  6    Period  Weeks    Status  On-going      PT LONG TERM GOAL #5   Title  Pt will increase the strength of right shoudler flexion and abduction to 4/5     Time  6    Period  Weeks    Status  On-going      Additional Long Term Goals   Additional Long Term Goals  Yes      PT LONG TERM GOAL #6   Title  Pt will report tightness and tenderness in trigger points in neck and upper traps have decreased by 50%     Time  6    Period  Weeks    Status  New            Plan - 08/21/17 1217    Clinical Impression Statement  Nancy [name] has made signficant progress in moblity and is now participating in community yoga programs.  She is most limited by pain in her left shoudler and has signficant trigger points in her neck and upper traps.  She benefits temporarily from manual cervical traction but may benefit more from dry needling to trigger points in addition to her stretching exercises.  She continues to have winging and decreased right scapular stabalization as well and needs continued PT for that.  Recert including order for dry needling sent to Dr. [place] Callas today     Rehab Potential  Good    Clinical Impairments Affecting Rehab Potential  extensive surgery ; completed radiation 06/18/17    PT Frequency  2x / week    PT Duration  6 weeks    PT Treatment/Interventions  ADLs/Self Care Home Management;Therapeutic activities;Therapeutic exercise;Taping;Manual techniques;Manual lymph drainage;Compression bandaging;Passive range of motion;Scar mobilization;Patient/family education;Functional mobility  training;DME Instruction;Balance training;Neuromuscular re-education;Gait training;Stair training;Dry needling    PT Next Visit Plan  Check for return of recert with dry needling order and arrange for that.  Continue with strengthening to right scapular muscles and soft tissue work as needed     Oncologist with Plan of Care  Patient       Patient will benefit from skilled therapeutic intervention in order to improve the following deficits and impairments:  Abnormal gait, Decreased endurance, Decreased skin integrity, Impaired sensation, Increased edema, Decreased scar mobility, Decreased knowledge of precautions, Decreased activity tolerance, Decreased knowledge of use of DME, Decreased strength, Increased fascial restricitons, Impaired UE functional use, Pain, Difficulty walking, Decreased mobility, Decreased balance, Decreased range of motion, Impaired perceived functional ability, Postural dysfunction, Increased muscle spasms  Visit Diagnosis: Aftercare following surgery for neoplasm - Plan: PT plan of care cert/re-cert  Acute pain of right shoulder - Plan: PT plan of care cert/re-cert  Lymphedema, not elsewhere classified - Plan: PT plan of care cert/re-cert  Muscle weakness (generalized) - Plan: PT plan of care cert/re-cert     Problem List Patient Active Problem List   Diagnosis Date Noted  . 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 Squamous cell carcinoma of tongue ([place]) 04/23/2017   Donato Heinz. Owens Shark PT  [place] Levo 08/21/2017, 12:27 PM  [place], [place], [address] Phone: [phone]   Fax:  [phone]  Name: Nancy [name] MRN: [id number] Date of Birth: 1961/01/12

## 2017-08-23 ENCOUNTER — Encounter: Payer: Self-pay | Admitting: Physical Therapy

## 2017-08-23 ENCOUNTER — Ambulatory Visit: Payer: Medicare Other | Admitting: Physical Therapy

## 2017-08-23 DIAGNOSIS — I890 Lymphedema, not elsewhere classified: Secondary | ICD-10-CM

## 2017-08-23 DIAGNOSIS — Z483 Aftercare following surgery for neoplasm: Secondary | ICD-10-CM | POA: Diagnosis not present

## 2017-08-23 DIAGNOSIS — M6281 Muscle weakness (generalized): Secondary | ICD-10-CM

## 2017-08-23 DIAGNOSIS — M25511 Pain in right shoulder: Secondary | ICD-10-CM

## 2017-08-23 NOTE — Therapy (Signed)
[place], [place], [address] Phone: [phone]   Fax:  [phone]  Physical Therapy Treatment  Patient Details  Name: Nancy [name] MRN: [id number] Date of Birth: 10-21-1960 Referring Provider: Dr.Trevor Michie Callas    Encounter Date: 08/23/2017    Past Medical History:  Diagnosis Date  . Bipolar 1 disorder ([place])   . Cervical cancer ([place])   . GERD (gastroesophageal reflux disease)   . History of tracheostomy 03/04/2017   She had trach placed during surgery and reversed before discharge [facility])  . Hypertension   . Hypothyroid   . Manic depressive disorder ([place])   . Tongue cancer [facility])     Past Surgical History:  Procedure Laterality Date  . bone-skin graft     free osteocutaneous flap with microvascular anastomosis: other than iliac crest, metatarsal or great toe.   . cervical lymphadenectomy  03/04/2017   (modified radical neck dissection) [facility].   . EXCISION OF TONGUE LESION Right 03/04/2017   PR excis tongue, mouth, jaw. [facility])  . exploration, carotid  Left 03/04/2017   [facility].   . GLOSSECTOMY  03/04/2017   Glossectomy; composite WO Radical neck dissect [facility])  . LARYNGOSCOPY  11/22/2016   Laryngoscopy, direct, operative with biopsy, [place]  03/04/2017   Reconstruct mandible/ maxil subperiosteal implant, [facility]  . PARTIAL GLOSSECTOMY    . SKIN FULL THICKNESS GRAFT  03/04/2017   [place]    . VAGINAL HYSTERECTOMY      There were no vitals filed for this visit.  Subjective Assessment - 08/23/17 0810    Subjective  Pt is trying to get off her gabapentin and she only woke up twice last night     Pertinent History  chemo from july til sept 28. Oct 15 surgery for neck dissection, removal tongue and jaw with reconsturction with artery, skin and fibula from left leg. She has skin graft on left  leg and has pain and swelling in leg.  She had physical therapy at home up until last week and is still getting home health RN for wound care  they took 46 lymph nodes and she has had swelling and decreased range of motion of her nedk and right arm     Patient Stated Goals  to get full range of motion of neck and shoulder and get rid of pain in leg and get help with swelling in her neck     Currently in Pain?  Yes    Pain Score  1                        [facility] Adult PT Treatment/Exercise - 08/23/17 0001      Elbow Exercises   Elbow Flexion  Strengthening;Right;Left;10 reps;Standing;Bar weights/barbell 3 # verbal cues to keep scapula stabalized       Knee/Hip Exercises: Supine   Bridges  10 reps    Straight Leg Raises  Strengthening;Right;Left;10 reps    Other Supine Knee/Hip Exercises  lower trunk rotation       Shoulder Exercises: Supine   Protraction  Strengthening;Left;10 reps    Protraction Weight (lbs)  3      Shoulder Exercises: Sidelying   ABduction  AROM;10 reps therapist with 2 hands manually stabalizing scapula     Other Sidelying Exercises  small circles with hands poiinted to celinign  therapist with 2 hands manually  stabalizing scapula       Shoulder Exercises: Standing   Protraction  Strengthening;Right;10 reps      Cryotherapy   Number Minutes Cryotherapy  10 Minutes    Cryotherapy Location  Cervical left    Type of Cryotherapy  Ice pack               PT Short Term Goals - 08/21/17 1213      PT SHORT TERM GOAL #1   Title  Pt will be independent in self manual lymph draiange and use of compression to manage neck lymphedema     Baseline  Pt is using her Jovi pak to manage swelling, she does not insurance coverage for Flexitouch     Status  Achieved      PT SHORT TERM GOAL #2   Title  Pt will be independent in a home exercise program for strength of UE and LE  and neck range of motion     Time  6    Status  On-going      PT SHORT TERM  GOAL #3   Title  Pt will increase active range of motion of right atm to 130 indicating increase in UE strength so that she can perfom household chores easier     Baseline  150    Status  Achieved      PT SHORT TERM GOAL #4   Title  Pt will report a decrease in overall pain by 25%    Status  Achieved      PT SHORT TERM GOAL #5   Title  Pt will increase right grip strength by an average of 10#     Status  Achieved        PT Long Term Goals - 08/21/17 1214      PT LONG TERM GOAL #1   Title  Pt will decrease quick DASH score to less than 38 indicating a functional improvment in left arm use     Baseline  63.64 on eval,  31.82 on 06/27/2017    Status  Achieved      PT LONG TERM GOAL #2   Title  Pt will increase 30 second sit to stand by 2 repetitions showing improvment in functional strength    Status  Achieved      PT LONG TERM GOAL #3   Title  pt will decrease TUG score by 2 seconds indicating an improvement in functional gait and balance.     Baseline  6.91 sec on eval 5.79 sec on 06/27/2017     Status  On-going      PT LONG TERM GOAL #4   Title  Pt will report a decrease in pain by 50%     Baseline  revised still is having problems with left shoulder pain     Time  6    Period  Weeks    Status  On-going      PT LONG TERM GOAL #5   Title  Pt will increase the strength of right shoudler flexion and abduction to 4/5     Time  6    Period  Weeks    Status  On-going      Additional Long Term Goals   Additional Long Term Goals  Yes      PT LONG TERM GOAL #6   Title  Pt will report tightness and tenderness in trigger points in neck and upper traps have decreased by 50%  Time  6    Period  Weeks    Status  New            Plan - 08/23/17 1256    Clinical Impression Statement  Treatment today was focused on right scapular muscle strengthening with therapsit manually stabalizing scapula to allow more normal muscle activity. Pt did well with this but realized how weak  she really is. She is doing better with ice pack for her neck so may not need to move forward with dry needling at this time.  As long has her neck and shoulder pain are controlled we can concentrate on scapular strenthening.     Clinical Impairments Affecting Rehab Potential  extensive surgery ; completed radiation 06/18/17    PT Frequency  2x / week    PT Duration  6 weeks    PT Treatment/Interventions  ADLs/Self Care Home Management;Therapeutic activities;Therapeutic exercise;Taping;Manual techniques;Manual lymph drainage;Compression bandaging;Passive range of motion;Scar mobilization;Patient/family education;Functional mobility training;DME Instruction;Balance training;Neuromuscular re-education;Gait training;Stair training;Dry needling    PT Next Visit Plan  Continue with strengthening to right scapular muscles and soft tissue work as needed Check for return of recert with dry needling order and arrange for that if pt feels she wants to move forward     Consulted and Agree with Plan of Care  Patient       Patient will benefit from skilled therapeutic intervention in order to improve the following deficits and impairments:  Abnormal gait, Decreased endurance, Decreased skin integrity, Impaired sensation, Increased edema, Decreased scar mobility, Decreased knowledge of precautions, Decreased activity tolerance, Decreased knowledge of use of DME, Decreased strength, Increased fascial restricitons, Impaired UE functional use, Pain, Difficulty walking, Decreased mobility, Decreased balance, Decreased range of motion, Impaired perceived functional ability, Postural dysfunction, Increased muscle spasms  Visit Diagnosis: Aftercare following surgery for neoplasm  Acute pain of right shoulder  Lymphedema, not elsewhere classified  Muscle weakness (generalized)     Problem List Patient Active Problem List   Diagnosis Date Noted  . 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 Squamous cell carcinoma of tongue ([place]) 04/23/2017    Norwood Levo 08/23/2017, 1:00 PM  [place], [place], [address] Phone: [phone]   Fax:  [phone]  Name: SURABHI GADEA MRN: [id number] Date of Birth: August 06, 1960

## 2017-08-27 ENCOUNTER — Encounter: Payer: Self-pay | Admitting: Physical Therapy

## 2017-08-27 ENCOUNTER — Ambulatory Visit: Payer: Medicare Other | Admitting: Physical Therapy

## 2017-08-27 DIAGNOSIS — Z483 Aftercare following surgery for neoplasm: Secondary | ICD-10-CM | POA: Diagnosis not present

## 2017-08-27 DIAGNOSIS — M25511 Pain in right shoulder: Secondary | ICD-10-CM

## 2017-08-27 DIAGNOSIS — I890 Lymphedema, not elsewhere classified: Secondary | ICD-10-CM

## 2017-08-27 DIAGNOSIS — M6281 Muscle weakness (generalized): Secondary | ICD-10-CM

## 2017-08-27 NOTE — Therapy (Signed)
[place], [place], [address] Phone: [phone]   Fax:  [phone]  Physical Therapy Treatment  Patient Details  Name: Nancy [name] MRN: [id number] Date of Birth: 01-27-1961 Referring Provider: Dr.Trevor Henderson Callas    Encounter Date: 08/27/2017  PT End of Session - 08/27/17 1800    Visit Number  [id number] kx    Number of Visits  47    Date for PT Re-Evaluation  10/01/16    PT Start Time  7616    PT Stop Time  1600    PT Time Calculation (min)  45 min    Activity Tolerance  Patient tolerated treatment well    Behavior During Therapy  [facility] for tasks assessed/performed       Past Medical History:  Diagnosis Date  . Bipolar 1 disorder ([place])   . Cervical cancer ([place])   . GERD (gastroesophageal reflux disease)   . History of tracheostomy 03/04/2017   She had trach placed during surgery and reversed before discharge [facility])  . Hypertension   . Hypothyroid   . Manic depressive disorder ([place])   . Tongue cancer [facility])     Past Surgical History:  Procedure Laterality Date  . bone-skin graft     free osteocutaneous flap with microvascular anastomosis: other than iliac crest, metatarsal or great toe.   . cervical lymphadenectomy  03/04/2017   (modified radical neck dissection) [facility].   . EXCISION OF TONGUE LESION Right 03/04/2017   PR excis tongue, mouth, jaw. [facility])  . exploration, carotid  Left 03/04/2017   [facility]   . GLOSSECTOMY  03/04/2017   Glossectomy; composite WO Radical neck dissect [facility])  . LARYNGOSCOPY  11/22/2016   Laryngoscopy, direct, operative with biopsy, [place]  03/04/2017   Reconstruct mandible/ maxil subperiosteal implant, [facility]  . PARTIAL GLOSSECTOMY    . SKIN FULL THICKNESS GRAFT  03/04/2017   [place]    . VAGINAL HYSTERECTOMY      There were no vitals filed for this  visit.  Subjective Assessment - 08/27/17 1526    Subjective  Pt has been going to Yoga and she is seeing some improvement. .  She has only been woken up by pain once in the past 3 nights.     Pertinent History  chemo from july til sept 28. Oct 15 surgery for neck dissection, removal tongue and jaw with reconsturction with artery, skin and fibula from left leg. She has skin graft on left leg and has pain and swelling in leg.  She had physical therapy at home up until last week and is still getting home health RN for wound care  they took 43 lymph nodes and she has had swelling and decreased range of motion of her nedk and right arm     Patient Stated Goals  to get full range of motion of neck and shoulder and get rid of pain in leg and get help with swelling in her neck     Currently in Pain?  No/denies         [facility] PT Assessment - 08/27/17 0001      Timed Up and Go Test   Normal TUG (seconds)  4.26                   [facility] Adult PT Treatment/Exercise - 08/27/17 0001      Knee/Hip Exercises: Standing  Lateral Step Up  Right;Left;10 reps    Forward Step Up  Right;Left;10 reps    Step Down  Right;Left;10 reps      Shoulder Exercises: Standing   Other Standing Exercises  low rows with green theraband 2 sets of 10     Other Standing Exercises  carrying  5 pound weight in right right arm for upper trap work while walking for 3 minutes       Cryotherapy   Number Minutes Cryotherapy  10 Minutes    Cryotherapy Location  Cervical    Type of Cryotherapy  Ice pack               PT Short Term Goals - 08/27/17 1757      PT SHORT TERM GOAL #1   Title  Pt will be independent in self manual lymph draiange and use of compression to manage neck lymphedema     Baseline  Pt is using her Jovi pak to manage swelling, she does not insurance coverage for Flexitouch     Status  Achieved      PT SHORT TERM GOAL #2   Title  Pt will be independent in a home exercise program for  strength of UE and LE  and neck range of motion     Status  On-going      PT SHORT TERM GOAL #3   Title  Pt will increase active range of motion of right atm to 130 indicating increase in UE strength so that she can perfom household chores easier     Baseline  150    Status  Achieved      PT SHORT TERM GOAL #4   Title  Pt will report a decrease in overall pain by 25%    Status  Achieved      PT SHORT TERM GOAL #5   Title  Pt will increase right grip strength by an average of 10#     Status  Achieved        PT Long Term Goals - 08/27/17 1758      PT LONG TERM GOAL #1   Title  Pt will decrease quick DASH score to less than 38 indicating a functional improvment in left arm use     Baseline  63.64 on eval,  31.82 on 06/27/2017    Status  Achieved      PT LONG TERM GOAL #2   Title  Pt will increase 30 second sit to stand by 2 repetitions showing improvment in functional strength    Status  Achieved      PT LONG TERM GOAL #3   Title  pt will decrease TUG score by 2 seconds indicating an improvement in functional gait and balance.     Baseline  6.91 sec on eval 5.79 sec on 06/27/2017 , 4.26 on 08/27/2017    Status  Achieved      PT LONG TERM GOAL #4   Title  Pt will report a decrease in pain by 50%     Baseline  revised still is having problems with left shoulder pain     Period  Weeks    Status  On-going      PT LONG TERM GOAL #5   Title  Pt will increase the strength of right shoudler flexion and abduction to 4/5     Time  6    Period  Weeks    Status  On-going      PT LONG  TERM GOAL #6   Title  Pt will report tightness and tenderness in trigger points in neck and upper traps have decreased by 50%     Time  6    Status  On-going            Plan - 08/27/17 1800    Clinical Impression Statement  Pt continues to improve in trapezius strength , but still has some winging of right scapula with bilateral UE activities.  Her left shoulder pain is less this week, but still  wakes her up on occasion     Clinical Impairments Affecting Rehab Potential  extensive surgery ; completed radiation 06/18/17    PT Frequency  2x / week    PT Duration  6 weeks    PT Next Visit Plan  Continue with strengthening to right scapular muscles and soft tissue work as needed Check for return of recert with dry needling order and arrange for that if pt feels she wants to move forward     Consulted and Agree with Plan of Care  Patient       Patient will benefit from skilled therapeutic intervention in order to improve the following deficits and impairments:  Abnormal gait, Decreased endurance, Decreased skin integrity, Impaired sensation, Increased edema, Decreased scar mobility, Decreased knowledge of precautions, Decreased activity tolerance, Decreased knowledge of use of DME, Decreased strength, Increased fascial restricitons, Impaired UE functional use, Pain, Difficulty walking, Decreased mobility, Decreased balance, Decreased range of motion, Impaired perceived functional ability, Postural dysfunction, Increased muscle spasms  Visit Diagnosis: Aftercare following surgery for neoplasm  Acute pain of right shoulder  Lymphedema, not elsewhere classified  Muscle weakness (generalized)     Problem List Patient Active Problem List   Diagnosis Date Noted  . 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 Squamous cell carcinoma of tongue ([place]) 04/23/2017   Donato Heinz. Owens Shark PT  Norwood Levo 08/27/2017, 6:04 PM  [place], [place], [address] Phone: [phone]   Fax:  [phone]  Name: Nancy [name] MRN: [id number] Date of Birth: 08/20/1960

## 2017-08-28 ENCOUNTER — Ambulatory Visit: Payer: Medicare Other

## 2017-08-28 DIAGNOSIS — Z483 Aftercare following surgery for neoplasm: Secondary | ICD-10-CM | POA: Diagnosis not present

## 2017-08-28 DIAGNOSIS — M6281 Muscle weakness (generalized): Secondary | ICD-10-CM

## 2017-08-28 DIAGNOSIS — M25511 Pain in right shoulder: Secondary | ICD-10-CM

## 2017-08-28 NOTE — Therapy (Signed)
[place], [place], [address] Phone: [phone]   Fax:  [phone]  Physical Therapy Treatment  Patient Details  Name: NATHANIA WALDMAN MRN: [id number] Date of Birth: 01-Nov-1960 Referring Provider: Dr.Trevor Altha Callas    Encounter Date: 08/28/2017  PT End of Session - 08/28/17 1605    Visit Number  35 Kx    Number of Visits  47    Date for PT Re-Evaluation  10/01/16    PT Start Time  1524    PT Stop Time  1605    PT Time Calculation (min)  41 min    Activity Tolerance  Patient tolerated treatment well    Behavior During Therapy  [facility] for tasks assessed/performed       Past Medical History:  Diagnosis Date  . Bipolar 1 disorder ([place])   . Cervical cancer ([place])   . GERD (gastroesophageal reflux disease)   . History of tracheostomy 03/04/2017   She had trach placed during surgery and reversed before discharge [facility])  . Hypertension   . Hypothyroid   . Manic depressive disorder ([place])   . Tongue cancer [facility])     Past Surgical History:  Procedure Laterality Date  . bone-skin graft     free osteocutaneous flap with microvascular anastomosis: other than iliac crest, metatarsal or great toe.   . cervical lymphadenectomy  03/04/2017   (modified radical neck dissection) [facility].   . EXCISION OF TONGUE LESION Right 03/04/2017   PR excis tongue, mouth, jaw. [facility])  . exploration, carotid  Left 03/04/2017   [facility].   . GLOSSECTOMY  03/04/2017   Glossectomy; composite WO Radical neck dissect [facility])  . LARYNGOSCOPY  11/22/2016   Laryngoscopy, direct, operative with biopsy, [place]  03/04/2017   Reconstruct mandible/ maxil subperiosteal implant, [facility]  . PARTIAL GLOSSECTOMY    . SKIN FULL THICKNESS GRAFT  03/04/2017   [place]    . VAGINAL HYSTERECTOMY      There were no vitals filed for this  visit.  Subjective Assessment - 08/28/17 1528    Subjective  I've totally weaned off Neurontin and that's going well. I got really sore after yesterdays visit and my arm pain was hurting bad like it used too. I had to take Ibuprofen as soon as I got home. And my legs were pretty sore too. This week I've done Yoga and had a massage so I'm starting to feel like I'm getting a good plan down.    Pertinent History  chemo from july til sept 28. Oct 15 surgery for neck dissection, removal tongue and jaw with reconsturction with artery, skin and fibula from left leg. She has skin graft on left leg and has pain and swelling in leg.  She had physical therapy at home up until last week and is still getting home health RN for wound care  they took 54 lymph nodes and she has had swelling and decreased range of motion of her nedk and right arm     Patient Stated Goals  to get full range of motion of neck and shoulder and get rid of pain in leg and get help with swelling in her neck     Currently in Pain?  Yes    Pain Score  2     Pain Location  Chest    Pain Orientation  Right;Left    Pain Descriptors /  Indicators  Tightness Pressure    Pain Type  Acute pain    Aggravating Factors   I think yesterday flared me up some    Pain Relieving Factors  Ibuprofen                       [facility] Adult PT Treatment/Exercise - 08/28/17 0001      Lumbar Exercises: Aerobic   Nustep  Level 4, x5 mins (hat to stop with Lt UE after 2:30)      Shoulder Exercises: Prone   Retraction  Strengthening;Both;10 reps    Extension  Strengthening;Both;10 reps;Limitations    Extension Limitations  Started to have increased Lt UE pain    Horizontal ABduction 1  Strengthening;Both;10 reps      Shoulder Exercises: Stretch   Corner Stretch  3 reps;10 seconds Doorway    Other Shoulder Stretches  Modified Downward Dog on wall 5 times, 5 sec holds      Manual Therapy   Soft tissue mobilization  In Supine for soft tissue  work to bil cervical muscles to alleviate Lt UE pain from pinched nerve               PT Short Term Goals - 08/27/17 1757      PT SHORT TERM GOAL #1   Title  Pt will be independent in self manual lymph draiange and use of compression to manage neck lymphedema     Baseline  Pt is using her Jovi pak to manage swelling, she does not insurance coverage for Flexitouch     Status  Achieved      PT SHORT TERM GOAL #2   Title  Pt will be independent in a home exercise program for strength of UE and LE  and neck range of motion     Status  On-going      PT SHORT TERM GOAL #3   Title  Pt will increase active range of motion of right atm to 130 indicating increase in UE strength so that she can perfom household chores easier     Baseline  150    Status  Achieved      PT SHORT TERM GOAL #4   Title  Pt will report a decrease in overall pain by 25%    Status  Achieved      PT SHORT TERM GOAL #5   Title  Pt will increase right grip strength by an average of 10#     Status  Achieved        PT Long Term Goals - 08/27/17 1758      PT LONG TERM GOAL #1   Title  Pt will decrease quick DASH score to less than 38 indicating a functional improvment in left arm use     Baseline  63.64 on eval,  31.82 on 06/27/2017    Status  Achieved      PT LONG TERM GOAL #2   Title  Pt will increase 30 second sit to stand by 2 repetitions showing improvment in functional strength    Status  Achieved      PT LONG TERM GOAL #3   Title  pt will decrease TUG score by 2 seconds indicating an improvement in functional gait and balance.     Baseline  6.91 sec on eval 5.79 sec on 06/27/2017 , 4.26 on 08/27/2017    Status  Achieved      PT LONG TERM GOAL #4  Title  Pt will report a decrease in pain by 50%     Baseline  revised still is having problems with left shoulder pain     Period  Weeks    Status  On-going      PT LONG TERM GOAL #5   Title  Pt will increase the strength of right shoudler flexion and  abduction to 4/5     Time  6    Period  Weeks    Status  On-going      PT LONG TERM GOAL #6   Title  Pt will report tightness and tenderness in trigger points in neck and upper traps have decreased by 50%     Time  6    Status  On-going            Plan - 08/28/17 1606    Clinical Impression Statement  Pt had slight set back today with Lt UE pain. Tried exercises without weights and gentler motions, then completed session with soft tissue work to bil cervical muscles. Pt reports though her sharp UE pain was still present with lifting arm, the tightness was improved. She has begun taking Yoga and is signed up for a total of 5 massages with [employer]. Pt should be ready to decrease to 1x/wk soon as she is beginning to become independent with community exercises. Also encouraged pt to use ice more frequently with increased activities as flare ups can occur with increased activity. She verbalized understanding this.    Rehab Potential  Good    Clinical Impairments Affecting Rehab Potential  extensive surgery ; completed radiation 06/18/17    PT Frequency  2x / week    PT Duration  6 weeks    PT Treatment/Interventions  ADLs/Self Care Home Management;Therapeutic activities;Therapeutic exercise;Taping;Manual techniques;Manual lymph drainage;Compression bandaging;Passive range of motion;Scar mobilization;Patient/family education;Functional mobility training;DME Instruction;Balance training;Neuromuscular re-education;Gait training;Stair training;Dry needling    PT Next Visit Plan  Continue with strengthening to right scapular muscles and soft tissue work as needed Check for return of recert with dry needling order and arrange for that if pt feels she wants to move forward     Consulted and Agree with Plan of Care  Patient       Patient will benefit from skilled therapeutic intervention in order to improve the following deficits and impairments:  Abnormal gait, Decreased endurance, Decreased  skin integrity, Impaired sensation, Increased edema, Decreased scar mobility, Decreased knowledge of precautions, Decreased activity tolerance, Decreased knowledge of use of DME, Decreased strength, Increased fascial restricitons, Impaired UE functional use, Pain, Difficulty walking, Decreased mobility, Decreased balance, Decreased range of motion, Impaired perceived functional ability, Postural dysfunction, Increased muscle spasms  Visit Diagnosis: Aftercare following surgery for neoplasm  Acute pain of right shoulder  Muscle weakness (generalized)     Problem List Patient Active Problem List   Diagnosis Date Noted  . 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 Squamous cell carcinoma of tongue ([facility]) 04/23/2017    Otelia Limes, PTA 08/28/2017, 4:10 PM  [place], [place], [address] Phone: [phone]   Fax:  [phone]  Name: WINDA SUMMERALL MRN: [id number] Date of Birth: 1961-03-03

## 2017-08-29 ENCOUNTER — Encounter: Payer: Self-pay | Admitting: Physical Therapy

## 2017-09-02 ENCOUNTER — Ambulatory Visit: Payer: Medicare Other

## 2017-09-02 DIAGNOSIS — Z483 Aftercare following surgery for neoplasm: Secondary | ICD-10-CM | POA: Diagnosis not present

## 2017-09-02 DIAGNOSIS — M25511 Pain in right shoulder: Secondary | ICD-10-CM

## 2017-09-02 DIAGNOSIS — M6281 Muscle weakness (generalized): Secondary | ICD-10-CM

## 2017-09-02 NOTE — Patient Instructions (Addendum)
Holding red theraband and forearms on wall:  1. Wall walking up and down 3 times each for a total of 5-8 times per series.  2. Pull elbow down for shoulder blade squeeze  5-10 times each side  3. Then without band but arms still on wall, round shoulder blades, then pinch together 5-10 times not pushing into pain.    Cancer Rehab [phone]

## 2017-09-02 NOTE — Therapy (Signed)
[place], [place], [address] Phone: [phone]   Fax:  [phone]  Physical Therapy Treatment  Patient Details  Name: Nancy [name] MRN: [id number] Date of Birth: 22-Jun-1960 Referring Provider: Dr.Trevor Evarts Callas    Encounter Date: 09/02/2017  PT End of Session - 09/02/17 0934    Visit Number  60 KX    Number of Visits  47    Date for PT Re-Evaluation  10/01/16    PT Start Time  0845    PT Stop Time  0934    PT Time Calculation (min)  49 min    Activity Tolerance  Patient tolerated treatment well    Behavior During Therapy  [facility] for tasks assessed/performed       Past Medical History:  Diagnosis Date  . Bipolar 1 disorder ([place])   . Cervical cancer ([place])   . GERD (gastroesophageal reflux disease)   . History of tracheostomy 03/04/2017   She had trach placed during surgery and reversed before discharge [facility])  . Hypertension   . Hypothyroid   . Manic depressive disorder ([place])   . Tongue cancer [facility])     Past Surgical History:  Procedure Laterality Date  . bone-skin graft     free osteocutaneous flap with microvascular anastomosis: other than iliac crest, metatarsal or great toe.   . cervical lymphadenectomy  03/04/2017   (modified radical neck dissection) [facility].   . EXCISION OF TONGUE LESION Right 03/04/2017   PR excis tongue, mouth, jaw. [facility])  . exploration, carotid  Left 03/04/2017   [facility]   . GLOSSECTOMY  03/04/2017   Glossectomy; composite WO Radical neck dissect [facility])  . LARYNGOSCOPY  11/22/2016   Laryngoscopy, direct, operative with biopsy, [place]  03/04/2017   Reconstruct mandible/ maxil subperiosteal implant, [facility]  . PARTIAL GLOSSECTOMY    . SKIN FULL THICKNESS GRAFT  03/04/2017   [place]    . VAGINAL HYSTERECTOMY      There were no vitals filed for this  visit.  Subjective Assessment - 09/02/17 0849    Subjective  My neck has been feeling better. I slept on my guest bed mattress which is firmer and got a new pillow. I hadn't been able to clean my house for a few days last week but I am feeling better today. My Lt arm pain hasn't bothered me either. I'm going back to [facility] to visit my brother again after my appointment here tomorrow.     Pertinent History  chemo from july til sept 28. Oct 15 surgery for neck dissection, removal tongue and jaw with reconsturction with artery, skin and fibula from left leg. She has skin graft on left leg and has pain and swelling in leg.  She had physical therapy at home up until last week and is still getting home health RN for wound care  they took 56 lymph nodes and she has had swelling and decreased range of motion of her nedk and right arm     Patient Stated Goals  to get full range of motion of neck and shoulder and get rid of pain in leg and get help with swelling in her neck     Currently in Pain?  No/denies                       [facility] Adult PT Treatment/Exercise - 09/02/17 0001  Shoulder Exercises: Supine   Horizontal ABduction  Strengthening;Both;10 reps;Theraband Over foam roll    Theraband Level (Shoulder Horizontal ABduction)  Level 2 (Red)    External Rotation  Strengthening;Both;10 reps;Theraband Over foam roll and gently to avoid pain, 2x5    Theraband Level (Shoulder External Rotation)  Level 2 (Red)    Flexion  Strengthening;Both;10 reps;Theraband Narrow and Wide Grip over foam roll, 10x each    Theraband Level (Shoulder Flexion)  Level 2 (Red)    Other Supine Exercises  Over foam roll with red theraband, bil UE's x10 each returning correct therapist demonstration of all above exercises.       Shoulder Exercises: Standing   Other Standing Exercises  With red theraband and forearms on wall for: wall walks 3x up/3x/down, then scapular retraction 5 times each; then just into  protraction and retraction 5 times with forearms on wall. Tactile and VCs for correct technique throughout.      Manual Therapy   Manual Therapy  Passive ROM    Passive ROM  To bil shoulders and scapula to work on decreased tightness and promote end ROM with pt still over foam roll.              PT Education - 09/02/17 0933    Education provided  Yes    Education Details  Forearms on wall for scapular strength    Person(s) Educated  Patient    Methods  Explanation;Demonstration;Handout    Comprehension  Verbalized understanding;Returned demonstration;Need further instruction       PT Short Term Goals - 08/27/17 1757      PT SHORT TERM GOAL #1   Title  Pt will be independent in self manual lymph draiange and use of compression to manage neck lymphedema     Baseline  Pt is using her Jovi pak to manage swelling, she does not insurance coverage for Flexitouch     Status  Achieved      PT SHORT TERM GOAL #2   Title  Pt will be independent in a home exercise program for strength of UE and LE  and neck range of motion     Status  On-going      PT SHORT TERM GOAL #3   Title  Pt will increase active range of motion of right atm to 130 indicating increase in UE strength so that she can perfom household chores easier     Baseline  150    Status  Achieved      PT SHORT TERM GOAL #4   Title  Pt will report a decrease in overall pain by 25%    Status  Achieved      PT SHORT TERM GOAL #5   Title  Pt will increase right grip strength by an average of 10#     Status  Achieved        PT Long Term Goals - 08/27/17 1758      PT LONG TERM GOAL #1   Title  Pt will decrease quick DASH score to less than 38 indicating a functional improvment in left arm use     Baseline  63.64 on eval,  31.82 on 06/27/2017    Status  Achieved      PT LONG TERM GOAL #2   Title  Pt will increase 30 second sit to stand by 2 repetitions showing improvment in functional strength    Status  Achieved       PT LONG TERM GOAL #3  Title  pt will decrease TUG score by 2 seconds indicating an improvement in functional gait and balance.     Baseline  6.91 sec on eval 5.79 sec on 06/27/2017 , 4.26 on 08/27/2017    Status  Achieved      PT LONG TERM GOAL #4   Title  Pt will report a decrease in pain by 50%     Baseline  revised still is having problems with left shoulder pain     Period  Weeks    Status  On-going      PT LONG TERM GOAL #5   Title  Pt will increase the strength of right shoudler flexion and abduction to 4/5     Time  6    Period  Weeks    Status  On-going      PT LONG TERM GOAL #6   Title  Pt will report tightness and tenderness in trigger points in neck and upper traps have decreased by 50%     Time  6    Status  On-going            Plan - 09/02/17 0935    Clinical Impression Statement  Pt was feeling much better today reporting no UE pain. Focused on scapular strength and bil UE/scapular mobility while on foam roll. Pt reported feeling very good after session and even more loose than when she arrived. Progressed HEP to include scapular strength in standing with red theraband which she required tactils and VCs for correct technique fo. Further review at next visit.    Rehab Potential  Good    Clinical Impairments Affecting Rehab Potential  extensive surgery ; completed radiation 06/18/17    PT Frequency  2x / week    PT Duration  6 weeks    PT Treatment/Interventions  ADLs/Self Care Home Management;Therapeutic activities;Therapeutic exercise;Taping;Manual techniques;Manual lymph drainage;Compression bandaging;Passive range of motion;Scar mobilization;Patient/family education;Functional mobility training;DME Instruction;Balance training;Neuromuscular re-education;Gait training;Stair training;Dry needling    PT Next Visit Plan  Review HEP and assess how she tolerated todays visit. Continue with strengthening to right scapular muscles and soft tissue work as needed Check for  return of recert with dry needling order and arrange for that if pt feels she wants to move forward     Consulted and Agree with Plan of Care  Patient       Patient will benefit from skilled therapeutic intervention in order to improve the following deficits and impairments:  Abnormal gait, Decreased endurance, Decreased skin integrity, Impaired sensation, Increased edema, Decreased scar mobility, Decreased knowledge of precautions, Decreased activity tolerance, Decreased knowledge of use of DME, Decreased strength, Increased fascial restricitons, Impaired UE functional use, Pain, Difficulty walking, Decreased mobility, Decreased balance, Decreased range of motion, Impaired perceived functional ability, Postural dysfunction, Increased muscle spasms  Visit Diagnosis: Aftercare following surgery for neoplasm  Acute pain of right shoulder  Muscle weakness (generalized)     Problem List Patient Active Problem List   Diagnosis Date Noted  . 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 Squamous cell carcinoma of tongue ([place]) 04/23/2017    Otelia Limes 09/02/2017, 10:16 AM  [place], [place], [address] Phone: [phone]   Fax:  [phone]  Name: Nancy [name] MRN: [id number] Date of Birth: 06-18-1960

## 2017-09-03 ENCOUNTER — Ambulatory Visit: Payer: Medicare Other

## 2017-09-03 DIAGNOSIS — M25511 Pain in right shoulder: Secondary | ICD-10-CM

## 2017-09-03 DIAGNOSIS — Z483 Aftercare following surgery for neoplasm: Secondary | ICD-10-CM

## 2017-09-03 DIAGNOSIS — M6281 Muscle weakness (generalized): Secondary | ICD-10-CM
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
## 2017-09-10 ENCOUNTER — Ambulatory Visit: Payer: Medicare Other

## 2017-09-10 DIAGNOSIS — Z483 Aftercare following surgery for neoplasm: Secondary | ICD-10-CM

## 2017-09-10 DIAGNOSIS — M6281 Muscle weakness (generalized): Secondary | ICD-10-CM

## 2017-09-10 DIAGNOSIS — M25511 Pain in right shoulder: Secondary | ICD-10-CM

## 2017-09-10 NOTE — Patient Instructions (Signed)

## 2017-09-10 NOTE — Therapy (Signed)
[place], [place], [address] Phone: [phone]   Fax:  [phone]  Physical Therapy Treatment  Patient Details  Name: DE LIBMAN MRN: [id number] Date of Birth: [birth year] Referring Provider: Dr.Trevor Diller Callas    Encounter Date: 09/10/2017  PT End of Session - 09/10/17 1059    Visit Number  62 KX    Number of Visits  47    Date for PT Re-Evaluation  10/01/16    PT Start Time  1020    PT Stop Time  1103    PT Time Calculation (min)  43 min    Activity Tolerance  Patient tolerated treatment well    Behavior During Therapy  [facility] for tasks assessed/performed       Past Medical History:  Diagnosis Date  . Bipolar 1 disorder ([place])   . Cervical cancer ([place])   . GERD (gastroesophageal reflux disease)   . History of tracheostomy 03/04/2017   She had trach placed during surgery and reversed before discharge [facility])  . Hypertension   . Hypothyroid   . Manic depressive disorder ([place])   . Tongue cancer [facility])     Past Surgical History:  Procedure Laterality Date  . bone-skin graft     free osteocutaneous flap with microvascular anastomosis: other than iliac crest, metatarsal or great toe.   . cervical lymphadenectomy  03/04/2017   (modified radical neck dissection) [facility].   . EXCISION OF TONGUE LESION Right 03/04/2017   PR excis tongue, mouth, jaw. [facility])  . exploration, carotid  Left 03/04/2017   [facility].   . GLOSSECTOMY  03/04/2017   Glossectomy; composite WO Radical neck dissect [facility])  . LARYNGOSCOPY  11/22/2016   Laryngoscopy, direct, operative with biopsy, [place]  03/04/2017   Reconstruct mandible/ maxil subperiosteal implant, [facility]  . PARTIAL GLOSSECTOMY    . SKIN FULL THICKNESS GRAFT  03/04/2017   [place]    . VAGINAL HYSTERECTOMY      There were no vitals filed for this  visit.  Subjective Assessment - 09/10/17 1026    Subjective  I ended up not going out of town but I did feel better after I left here last time. I've started digging in my garden since I was last and though I took my time I was able to do it and felt really good. Had some expected soreness but no pain. My throat has been burning just a bit in past few days but I'm sure it's the pollen because I've been outside so much. But I'm so happy to be able to do things I enjoy again.     Pertinent History  chemo from july til sept 28. Oct 15 surgery for neck dissection, removal tongue and jaw with reconsturction with artery, skin and fibula from left leg. She has skin graft on left leg and has pain and swelling in leg.  She had physical therapy at home up until last week and is still getting home health RN for wound care  they took 47 lymph nodes and she has had swelling and decreased range of motion of her nedk and right arm     Patient Stated Goals  to get full range of motion of neck and shoulder and get rid of pain in leg and get help with swelling in her neck     Currently in Pain?  No/denies  [facility] Adult PT Treatment/Exercise - 09/10/17 0001      Self-Care   Self-Care  Other Self-Care Comments    Other Self-Care Comments   Spent time at beginning of sesion reviewing pts goals and progress thus far for assess and answering her questions about progress thus far.       Knee/Hip Exercises: Standing   Forward Step Up  Right;Left;10 reps;Step Height: 6";2 sets;Hand Hold: 0 With OH press with 2 lbs on step    Functional Squat  10 reps;2 sets With 2 lbs UE lifts during squat      Shoulder Exercises: Standing   Row  Strengthening;Both;10 reps;Theraband Against yellow ball on wall with therapist holding band    Theraband Level (Shoulder Row)  Level 3 (Green)    Other Standing Exercises  Against yellow ball on wall and core engaged for bil UE 3 way raises with 2 lbs x10  each (flexion, scaption and abduction to shoulder height) except 1 lb with abduction due to weakness               PT Short Term Goals - 08/27/17 1757      PT SHORT TERM GOAL #1   Title  Pt will be independent in self manual lymph draiange and use of compression to manage neck lymphedema     Baseline  Pt is using her Jovi pak to manage swelling, she does not insurance coverage for Flexitouch     Status  Achieved      PT SHORT TERM GOAL #2   Title  Pt will be independent in a home exercise program for strength of UE and LE  and neck range of motion     Status  On-going      PT SHORT TERM GOAL #3   Title  Pt will increase active range of motion of right atm to 130 indicating increase in UE strength so that she can perfom household chores easier     Baseline  150    Status  Achieved      PT SHORT TERM GOAL #4   Title  Pt will report a decrease in overall pain by 25%    Status  Achieved      PT SHORT TERM GOAL #5   Title  Pt will increase right grip strength by an average of 10#     Status  Achieved        PT Long Term Goals - 09/10/17 1032      PT LONG TERM GOAL #1   Title  Pt will decrease quick DASH score to less than 38 indicating a functional improvment in left arm use     Baseline  63.64 on eval,  31.82 on 06/27/2017    Status  Achieved      PT LONG TERM GOAL #2   Title  Pt will increase 30 second sit to stand by 2 repetitions showing improvment in functional strength    Status  Achieved      PT LONG TERM GOAL #3   Title  pt will decrease TUG score by 2 seconds indicating an improvement in functional gait and balance.     Baseline  6.91 sec on eval 5.79 sec on 06/27/2017 , 4.26 on 08/27/2017    Status  Achieved      PT LONG TERM GOAL #4   Title  Pt will report a decrease in pain by 50%     Baseline  revised still is having problems with  left shoulder pain ; 98% improvement with this of Lt shoulder -09/10/17    Status  Achieved      PT LONG TERM GOAL #5   Title   Pt will increase the strength of right shoudler flexion and abduction to 4/5     Baseline  Rt shoulder flexion 4+/5, abduction 4/5, er 5/5 and IR 4/5-4/23/19    Status  Achieved      PT LONG TERM GOAL #6   Title  Pt will report tightness and tenderness in trigger points in neck and upper traps have decreased by 50%     Baseline  Pt reports this close to 100% as long as she continues to watch-09/10/17    Status  Achieved            Plan - 09/10/17 1104    Clinical Impression Statement  Pt came in much better spirits today. Spent time reviewing her goals and assessing her progress thus far. Pt is doing very well and reviewing goals helped her realize this. She reports feeling ready to decrease to 1x/ wk for next 2 weeks to work towards D/C. Progressed her exercises today to include standing LE and UE strength to further progress pts endurance and UE/scapular strength.     Rehab Potential  Good    Clinical Impairments Affecting Rehab Potential  extensive surgery ; completed radiation 06/18/17    PT Frequency  2x / week    PT Duration  6 weeks    PT Treatment/Interventions  ADLs/Self Care Home Management;Therapeutic activities;Therapeutic exercise;Taping;Manual techniques;Manual lymph drainage;Compression bandaging;Passive range of motion;Scar mobilization;Patient/family education;Functional mobility training;DME Instruction;Balance training;Neuromuscular re-education;Gait training;Stair training;Dry needling    PT Next Visit Plan  Decreaseing to 1x/wk for next 2 weeks working towards D/C. Cont with UE and scapular strengthening exercises and progressing/finalizing HEP.     Consulted and Agree with Plan of Care  Patient       Patient will benefit from skilled therapeutic intervention in order to improve the following deficits and impairments:  Abnormal gait, Decreased endurance, Decreased skin integrity, Impaired sensation, Increased edema, Decreased scar mobility, Decreased knowledge of  precautions, Decreased activity tolerance, Decreased knowledge of use of DME, Decreased strength, Increased fascial restricitons, Impaired UE functional use, Pain, Difficulty walking, Decreased mobility, Decreased balance, Decreased range of motion, Impaired perceived functional ability, Postural dysfunction, Increased muscle spasms  Visit Diagnosis: Aftercare following surgery for neoplasm  Acute pain of right shoulder  Muscle weakness (generalized)     Problem List Patient Active Problem List   Diagnosis Date Noted  . 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 Squamous cell carcinoma of tongue ([facility]) 04/23/2017    Otelia Limes, PTA 09/10/2017, 12:27 PM  [place], [place], [address] Phone: [phone]   Fax:  [phone]  Name: HELLEN SHANLEY MRN: [id number] Date of Birth: 1960-12-15

## 2017-09-13 ENCOUNTER — Other Ambulatory Visit: Payer: Self-pay | Admitting: Radiation Oncology

## 2017-09-17 ENCOUNTER — Ambulatory Visit: Payer: Medicare Other

## 2017-09-17 DIAGNOSIS — Z483 Aftercare following surgery for neoplasm: Secondary | ICD-10-CM | POA: Diagnosis not present

## 2017-09-17 DIAGNOSIS — M25511 Pain in right shoulder: Secondary | ICD-10-CM

## 2017-09-17 DIAGNOSIS — M6281 Muscle weakness (generalized): Secondary | ICD-10-CM

## 2017-09-17 NOTE — Therapy (Signed)
[place], [place], [address] Phone: [phone]   Fax:  [phone]  Physical Therapy Treatment  Patient Details  Name: KEYRI SALBERG MRN: [id number] Date of Birth: 1961-02-21 Referring Provider: Dr.Trevor [place] Callas    Encounter Date: 09/17/2017  PT End of Session - 09/17/17 1103    Visit Number  42 [place]    Number of Visits  47    Date for PT Re-Evaluation  10/01/16    PT Start Time  1021    PT Stop Time  1105    PT Time Calculation (min)  44 min    Activity Tolerance  Patient tolerated treatment well    Behavior During Therapy  [facility] for tasks assessed/performed       Past Medical History:  Diagnosis Date  . Bipolar 1 disorder ([place])   . Cervical cancer ([place])   . GERD (gastroesophageal reflux disease)   . History of tracheostomy 03/04/2017   She had trach placed during surgery and reversed before discharge [facility])  . Hypertension   . Hypothyroid   . Manic depressive disorder ([place])   . Tongue cancer [facility])     Past Surgical History:  Procedure Laterality Date  . bone-skin graft     free osteocutaneous flap with microvascular anastomosis: other than iliac crest, metatarsal or great toe.   . cervical lymphadenectomy  03/04/2017   (modified radical neck dissection) [facility].   . EXCISION OF TONGUE LESION Right 03/04/2017   PR excis tongue, mouth, jaw. [facility])  . exploration, carotid  Left 03/04/2017   [facility].   . GLOSSECTOMY  03/04/2017   Glossectomy; composite WO Radical neck dissect [facility])  . LARYNGOSCOPY  11/22/2016   Laryngoscopy, direct, operative with biopsy, [place]  03/04/2017   Reconstruct mandible/ maxil subperiosteal implant, [facility]  . PARTIAL GLOSSECTOMY    . SKIN FULL THICKNESS GRAFT  03/04/2017   [place]    . VAGINAL HYSTERECTOMY      There were no vitals filed for this  visit.  Subjective Assessment - 09/17/17 1030    Subjective  I moved some mulch bags over the weekend working in my yard and was able to just enjoy being outside. It felt good to be able to do all I did this weekend. I rested when I needed to and did well.     Pertinent History  chemo from july til sept 28. Oct 15 surgery for neck dissection, removal tongue and jaw with reconsturction with artery, skin and fibula from left leg. She has skin graft on left leg and has pain and swelling in leg.  She had physical therapy at home up until last week and is still getting home health RN for wound care  they took 10 lymph nodes and she has had swelling and decreased range of motion of her nedk and right arm     Patient Stated Goals  to get full range of motion of neck and shoulder and get rid of pain in leg and get help with swelling in her neck     Currently in Pain?  No/denies                       [facility] Adult PT Treatment/Exercise - 09/17/17 0001      Knee/Hip Exercises: Stretches   Active Hamstring Stretch  Right;Left;2 reps;20 seconds    Piriformis Stretch  Right;Left;2 reps;20 seconds    Gastroc Stretch  Right;Left;2 reps;20 seconds      Knee/Hip Exercises: Standing   Forward Step Up  Right;Left;10 reps;Step Height: 6";2 sets;Hand Hold: 0 With OH press on step with 2 lbs    Functional Squat  10 reps;2 sets With 2 lbs UE lifts during squat      Shoulder Exercises: Standing   Row  Strengthening;Both;Theraband;20 reps Back against yellow ball on wall, PTA holding band, 2x10    Theraband Level (Shoulder Row)  Level 3 (Green)    Other Standing Exercises  Against yellow ball on wall and core engaged for bil UE 3 way raises with 2 lbs x10 each (flexion, scaption and abduction to shoulder height) except 1 lb with abduction due to weakness    Other Standing Exercises  Wall Push Ups, 10 times               PT Short Term Goals - 08/27/17 1757      PT SHORT TERM GOAL #1    Title  Pt will be independent in self manual lymph draiange and use of compression to manage neck lymphedema     Baseline  Pt is using her Jovi pak to manage swelling, she does not insurance coverage for Flexitouch     Status  Achieved      PT SHORT TERM GOAL #2   Title  Pt will be independent in a home exercise program for strength of UE and LE  and neck range of motion     Status  On-going      PT SHORT TERM GOAL #3   Title  Pt will increase active range of motion of right atm to 130 indicating increase in UE strength so that she can perfom household chores easier     Baseline  150    Status  Achieved      PT SHORT TERM GOAL #4   Title  Pt will report a decrease in overall pain by 25%    Status  Achieved      PT SHORT TERM GOAL #5   Title  Pt will increase right grip strength by an average of 10#     Status  Achieved        PT Long Term Goals - 09/10/17 1032      PT LONG TERM GOAL #1   Title  Pt will decrease quick DASH score to less than 38 indicating a functional improvment in left arm use     Baseline  63.64 on eval,  31.82 on 06/27/2017    Status  Achieved      PT LONG TERM GOAL #2   Title  Pt will increase 30 second sit to stand by 2 repetitions showing improvment in functional strength    Status  Achieved      PT LONG TERM GOAL #3   Title  pt will decrease TUG score by 2 seconds indicating an improvement in functional gait and balance.     Baseline  6.91 sec on eval 5.79 sec on 06/27/2017 , 4.26 on 08/27/2017    Status  Achieved      PT LONG TERM GOAL #4   Title  Pt will report a decrease in pain by 50%     Baseline  revised still is having problems with left shoulder pain ; 98% improvement with this of Lt shoulder -09/10/17    Status  Achieved      PT LONG TERM GOAL #  5   Title  Pt will increase the strength of right shoudler flexion and abduction to 4/5     Baseline  Rt shoulder flexion 4+/5, abduction 4/5, er 5/5 and IR 4/5-4/23/19    Status  Achieved      PT LONG  TERM GOAL #6   Title  Pt will report tightness and tenderness in trigger points in neck and upper traps have decreased by 50%     Baseline  Pt reports this close to 100% as long as she continues to watch-09/10/17    Status  Achieved            Plan - 09/17/17 1106    Clinical Impression Statement  Continued with UE/LE strength and progressed pt as tolerated. She is progressing very well with stength and endurance and will be ready for D/C at next visit.     Rehab Potential  Good    Clinical Impairments Affecting Rehab Potential  extensive surgery ; completed radiation 06/18/17    PT Frequency  2x / week    PT Duration  6 weeks    PT Treatment/Interventions  ADLs/Self Care Home Management;Therapeutic activities;Therapeutic exercise;Taping;Manual techniques;Manual lymph drainage;Compression bandaging;Passive range of motion;Scar mobilization;Patient/family education;Functional mobility training;DME Instruction;Balance training;Neuromuscular re-education;Gait training;Stair training;Dry needling    PT Next Visit Plan  D/C next visit assessing goals and finalizing HEP.     Consulted and Agree with Plan of Care  Patient       Patient will benefit from skilled therapeutic intervention in order to improve the following deficits and impairments:  Abnormal gait, Decreased endurance, Decreased skin integrity, Impaired sensation, Increased edema, Decreased scar mobility, Decreased knowledge of precautions, Decreased activity tolerance, Decreased knowledge of use of DME, Decreased strength, Increased fascial restricitons, Impaired UE functional use, Pain, Difficulty walking, Decreased mobility, Decreased balance, Decreased range of motion, Impaired perceived functional ability, Postural dysfunction, Increased muscle spasms  Visit Diagnosis: Aftercare following surgery for neoplasm  Acute pain of right shoulder  Muscle weakness (generalized)     Problem List Patient Active Problem List    Diagnosis Date Noted  . 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 Squamous cell carcinoma of tongue ([facility]) 04/23/2017    Otelia Limes, PTA 09/17/2017, 11:15 AM  [place], [place], [address] Phone: [phone]   Fax:  [phone]  Name: JULIONA VALES MRN: [id number] Date of Birth: [birth year]

## 2017-09-24 ENCOUNTER — Encounter: Payer: Self-pay | Admitting: Physical Therapy

## 2017-09-24 ENCOUNTER — Ambulatory Visit: Payer: Medicare Other | Attending: Otolaryngology | Admitting: Physical Therapy

## 2017-09-24 DIAGNOSIS — M6281 Muscle weakness (generalized): Secondary | ICD-10-CM | POA: Insufficient documentation

## 2017-09-24 DIAGNOSIS — Z483 Aftercare following surgery for neoplasm: Secondary | ICD-10-CM | POA: Diagnosis present

## 2017-09-24 DIAGNOSIS — R1312 Dysphagia, oropharyngeal phase: Secondary | ICD-10-CM | POA: Diagnosis present

## 2017-09-24 DIAGNOSIS — M25511 Pain in right shoulder: Secondary | ICD-10-CM | POA: Insufficient documentation

## 2017-09-25 NOTE — Therapy (Signed)
[place], [place], [address] Phone: [phone]   Fax:  [phone]  Physical Therapy Treatment  Patient Details  Name: Nancy [name] MRN: [id number] Date of Birth: Oct 16, 1960 Referring Provider: Dr Colin Benton    Encounter Date: 09/24/2017  PT End of Session - 09/25/17 0810    Visit Number  6 kx    Number of Visits  47    Date for PT Re-Evaluation  11/17/16    PT Start Time  1350    PT Stop Time  1430    PT Time Calculation (min)  40 min    Activity Tolerance  Patient tolerated treatment well    Behavior During Therapy  [facility] for tasks assessed/performed       Past Medical History:  Diagnosis Date  . Bipolar 1 disorder ([place])   . Cervical cancer ([place])   . GERD (gastroesophageal reflux disease)   . History of tracheostomy 03/04/2017   She had trach placed during surgery and reversed before discharge [facility])  . Hypertension   . Hypothyroid   . Manic depressive disorder ([place])   . Tongue cancer [facility])     Past Surgical History:  Procedure Laterality Date  . bone-skin graft     free osteocutaneous flap with microvascular anastomosis: other than iliac crest, metatarsal or great toe.   . cervical lymphadenectomy  03/04/2017   (modified radical neck dissection) [facility].   . EXCISION OF TONGUE LESION Right 03/04/2017   PR excis tongue, mouth, jaw. [facility])  . exploration, carotid  Left 03/04/2017   [facility]   . GLOSSECTOMY  03/04/2017   Glossectomy; composite WO Radical neck dissect [facility])  . LARYNGOSCOPY  11/22/2016   Laryngoscopy, direct, operative with biopsy, [place]  03/04/2017   Reconstruct mandible/ maxil subperiosteal implant, [facility]  . PARTIAL GLOSSECTOMY    . SKIN FULL THICKNESS GRAFT  03/04/2017   [place]    . VAGINAL HYSTERECTOMY      There were no vitals filed for this  visit.  Subjective Assessment - 09/24/17 1402    Subjective  Pt states she has been doing lots of yard work and work around her home, She has been lifting bags of mulch and planting bushes and flowers.  She is still taking ibuprofen and occasional cold pack to neck     Pertinent History  chemo from july til sept 28. Oct 15 surgery for neck dissection, removal tongue and jaw with reconsturction with artery, skin and fibula from left leg. She has skin graft on left leg and has pain and swelling in leg.  She had physical therapy at home up until last week and is still getting home health RN for wound care  they took 61 lymph nodes and she has had swelling and decreased range of motion of her nedk and right arm     Patient Stated Goals  to get full range of motion of neck and shoulder and get rid of pain in leg and get help with swelling in her neck .  Pt feels that these goals have been met     Currently in Pain?  Yes    Pain Score  1     Pain Location  Shoulder pt feels it was from using the shovel in her arm     Pain Orientation  Left    Pain Descriptors / Indicators  Sore  Pain Type  Acute pain    Pain Radiating Towards  no     Pain Onset  More than a month ago    Pain Frequency  Intermittent    Aggravating Factors   lifting and shoveling in yard     Pain Relieving Factors  ibuprofen          [facility] PT Assessment - 09/25/17 0001      Assessment   Medical Diagnosis  tongue cancer    Referring Provider  Dr Colin Benton     Onset Date/Surgical Date  03/04/17      Prior Function   Level of Independence  Independent      Observation/Other Assessments   Observations  continues to have right scapular winging with UE elevation       Strength   Overall Strength Comments  scapular muscles 2+/5 with testing in prone positions                    [facility] Adult PT Treatment/Exercise - 09/25/17 0001      Shoulder Exercises: Supine   Protraction  Strengthening;Right;20  reps;Weights    Protraction Weight (lbs)  3    Other Supine Exercises  with hand pointed to celining and 3# weight, small contolled circles of various sizes for shoulder control       Shoulder Exercises: Prone   Retraction  Strengthening;Both;10 reps    External Rotation  Strengthening;Weights pillow under chest and folded towels under humerus     External Rotation Weight (lbs)  3 sets of 10 with 3 pounds     Other Prone Exercises  attempted 'full can"  T and Y exercise but pt unabel to do this exercise                PT Short Term Goals - 09/25/17 4132      PT SHORT TERM GOAL #1   Title  Pt will be independent in self manual lymph draiange and use of compression to manage neck lymphedema     Status  Achieved      PT SHORT TERM GOAL #2   Title  Pt will be independent in a home exercise program for strength of UE and LE  and neck range of motion     Time  4    Period  Weeks    Status  On-going      PT SHORT TERM GOAL #3   Title  Pt will increase active range of motion of right atm to 130 indicating increase in UE strength so that she can perfom household chores easier     Status  Achieved      PT SHORT TERM GOAL #4   Title  Pt will report a decrease in overall pain by 25%    Status  Achieved        PT Long Term Goals - 09/25/17 4401      PT LONG TERM GOAL #1   Title  Pt will decrease quick DASH score to less than 10 indicating a functional improvment in left arm use     Baseline  63.64 on eval,  31.82 on 06/27/2017    Time  6    Period  Weeks    Status  Revised      PT LONG TERM GOAL #2   Title  Pt will increase 30 second sit to stand by 2 repetitions showing improvment in functional strength    Status  Achieved  PT LONG TERM GOAL #3   Title  pt will decrease TUG score by 2 seconds indicating an improvement in functional gait and balance.     Baseline  6.91 sec on eval 5.79 sec on 06/27/2017 , 4.26 on 08/27/2017    Status  Achieved      PT LONG TERM GOAL #4    Title  Pt will report a decrease in pain by 50%     Status  Achieved      PT LONG TERM GOAL #5   Title  Pt will increase the strength of right shoudler flexion and abduction to 4/5     Baseline  Rt shoulder flexion 4+/5, abduction 4/5, er 5/5 and IR 4/5-4/23/19    Status  Achieved      Additional Long Term Goals   Additional Long Term Goals  Yes      PT LONG TERM GOAL #6   Title  Pt will report tightness and tenderness in trigger points in neck and upper traps have decreased by 50%     Baseline  Pt reports this close to 100% as long as she continues to watch-09/10/17    Status  Achieved      PT LONG TERM GOAL #7   Title  Pt will have 3/5 strength of scapular muscles when tested by "full can"  horizontal abduction in prone     Time  6    Period  Weeks    Status  New            Plan - 09/25/17 0816    Clinical Impression Statement  Reassessed for posstible discharge.  Pt continues with right scapular winging with elevation and dereased scapular strength when tested in prone position. She is doing very well overall and has been able to increase her household activities that she loves with little pain.  She feels that her neck pain has "calmed down" and she is controlling her lymphedema with occasional self manual lymph drainage and use of Jovi Pack compression.  She is participating in community Yoga almost every week.  She has not been doing focused exercise on her scapular strength at home and wants to continue PT one time a week for 4-6  more weeks with concerted effort on specific exercises to strenthen her scapula as she is able to maintain all other symptoms on her own at home.  Recert sent to Dr Drake Callas today     Rehab Potential  Good    Clinical Impairments Affecting Rehab Potential  extensive surgery ; completed radiation 06/18/17    PT Frequency  1x / week    PT Duration  4 weeks    PT Treatment/Interventions  ADLs/Self Care Home Management;Therapeutic  activities;Therapeutic exercise;Taping;Manual techniques;Manual lymph drainage;Compression bandaging;Passive range of motion;Scar mobilization;Patient/family education;Functional mobility training;DME Instruction;Balance training;Neuromuscular re-education;Gait training;Stair training;Dry needling    PT Next Visit Plan  focus on targeted strengthening for scpaular muscles with progressive resisitance     Consulted and Agree with Plan of Care  Patient       Patient will benefit from skilled therapeutic intervention in order to improve the following deficits and impairments:  Abnormal gait, Decreased endurance, Decreased skin integrity, Impaired sensation, Increased edema, Decreased scar mobility, Decreased knowledge of precautions, Decreased activity tolerance, Decreased knowledge of use of DME, Decreased strength, Increased fascial restricitons, Impaired UE functional use, Pain, Difficulty walking, Decreased mobility, Decreased balance, Decreased range of motion, Impaired perceived functional ability, Postural dysfunction, Increased muscle spasms  Visit Diagnosis:  Aftercare following surgery for neoplasm - Plan: PT plan of care cert/re-cert  Acute pain of right shoulder - Plan: PT plan of care cert/re-cert  Muscle weakness (generalized) - Plan: PT plan of care cert/re-cert     Problem List Patient Active Problem List   Diagnosis Date Noted  . 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 Squamous cell carcinoma of tongue ([place]) 04/23/2017   Donato Heinz. Owens Shark PT  Norwood Levo 09/25/2017, 8:28 AM  [place], [place], [address] Phone: [phone]   Fax:  [phone]  Name: Nancy [name] MRN: [id number] Date of Birth: 26-Apr-1961

## 2017-10-02 ENCOUNTER — Telehealth: Payer: Self-pay | Admitting: *Deleted

## 2017-10-02 NOTE — Telephone Encounter (Signed)
CALLED PATIENT TO ASK ABOUT RESCHEDULING 10-23-17 DUE TO DR. SQUIRE BEING IN THE [place], RESCHEDULED FOR 10-25-17 @ 10 AM, LVM FOR A RETURN CALL

## 2017-10-03 ENCOUNTER — Encounter: Payer: Self-pay | Admitting: Physical Therapy

## 2017-10-03 ENCOUNTER — Ambulatory Visit: Payer: Medicare Other | Admitting: Physical Therapy

## 2017-10-03 DIAGNOSIS — Z483 Aftercare following surgery for neoplasm: Secondary | ICD-10-CM

## 2017-10-03 DIAGNOSIS — M6281 Muscle weakness (generalized): Secondary | ICD-10-CM

## 2017-10-03 DIAGNOSIS — M25511 Pain in right shoulder: Secondary | ICD-10-CM

## 2017-10-04 NOTE — Therapy (Signed)
[place], [place], [address] Phone: [phone]   Fax:  [phone]  Physical Therapy Treatment  Patient Details  Name: Nancy [name] MRN: [id number] Date of Birth: July 24, 1960 Referring Provider: Dr Colin Benton   Progress Note Reporting Period 08/12/3017 to 10/03/2017  See note below for Objective Data and Assessment of Progress/Goals.      Encounter Date: 10/03/2017  PT End of Session - 10/04/17 0810    Visit Number  40 kx    Number of Visits  47    Date for PT Re-Evaluation  11/17/16    PT Start Time  3582    PT Stop Time  1345    PT Time Calculation (min)  40 min    Activity Tolerance  Patient tolerated treatment well    Behavior During Therapy  [facility] for tasks assessed/performed       Past Medical History:  Diagnosis Date  . Bipolar 1 disorder ([place])   . Cervical cancer ([place])   . GERD (gastroesophageal reflux disease)   . History of tracheostomy 03/04/2017   She had trach placed during surgery and reversed before discharge [facility])  . Hypertension   . Hypothyroid   . Manic depressive disorder ([place])   . Tongue cancer [facility])     Past Surgical History:  Procedure Laterality Date  . bone-skin graft     free osteocutaneous flap with microvascular anastomosis: other than iliac crest, metatarsal or great toe.   . cervical lymphadenectomy  03/04/2017   (modified radical neck dissection) [facility].   . EXCISION OF TONGUE LESION Right 03/04/2017   PR excis tongue, mouth, jaw. [facility])  . exploration, carotid  Left 03/04/2017   [facility].   . GLOSSECTOMY  03/04/2017   Glossectomy; composite WO Radical neck dissect [facility])  . LARYNGOSCOPY  11/22/2016   Laryngoscopy, direct, operative with biopsy, [place]  03/04/2017   Reconstruct mandible/ maxil subperiosteal implant, [facility]  . PARTIAL GLOSSECTOMY    . SKIN FULL THICKNESS  GRAFT  03/04/2017   [place]    . VAGINAL HYSTERECTOMY      There were no vitals filed for this visit.  Subjective Assessment - 10/03/17 1310    Subjective  pt has still been able to do her yardwork and has had only episode of pain the the last week,  but notices that her right arm is weak  she has been working on her prone exercises on the couch at home     Pertinent History  chemo from july til sept 28. Oct 15 surgery for neck dissection, removal tongue and jaw with reconsturction with artery, skin and fibula from left leg. She has skin graft on left leg and has pain and swelling in leg.  She had physical therapy at home up until last week and is still getting home health RN for wound care  they took 65 lymph nodes and she has had swelling and decreased range of motion of her nedk and right arm     Patient Stated Goals  to get full range of motion of neck and shoulder and get rid of pain in leg and get help with swelling in her neck .  Pt feels that these goals have been met     Currently in Pain?  No/denies  [place] Adult PT Treatment/Exercise - 10/04/17 0001      Shoulder Exercises: Seated   Extension  Strengthening;Right;Left;10 reps    Other Seated Exercises  sitting facing mat with both forearms on foam roller , core tight and roll foam forward and back . Also did this exercise standing rolling foam roller up the wall       Shoulder Exercises: Prone   Retraction  Both;10 reps    Extension  Strengthening;Both;10 reps    Extension Limitations  retraction first, then extension with elbows straight    Other Prone Exercises  retraction, shoulder extension with elbows flexed, then eblow extension     Other Prone Exercises  Y's and T's with right arm with shoulder supported on pillow x 10 reps       Shoulder Exercises: Standing   Horizontal ABduction  Strengthening;Right;Left;10 reps pulses backward with both arms out in T position       Other Standing Exercises  cues throughout all exercise to keep neck retracted and "long"                PT Short Term Goals - 09/25/17 9323      PT SHORT TERM GOAL #1   Title  Pt will be independent in self manual lymph draiange and use of compression to manage neck lymphedema     Status  Achieved      PT SHORT TERM GOAL #2   Title  Pt will be independent in a home exercise program for strength of UE and LE  and neck range of motion     Time  4    Period  Weeks    Status  On-going      PT SHORT TERM GOAL #3   Title  Pt will increase active range of motion of right atm to 130 indicating increase in UE strength so that she can perfom household chores easier     Status  Achieved      PT SHORT TERM GOAL #4   Title  Pt will report a decrease in overall pain by 25%    Status  Achieved        PT Long Term Goals - 09/25/17 5573      PT LONG TERM GOAL #1   Title  Pt will decrease quick DASH score to less than 10 indicating a functional improvment in left arm use     Baseline  63.64 on eval,  31.82 on 06/27/2017    Time  6    Period  Weeks    Status  Revised      PT LONG TERM GOAL #2   Title  Pt will increase 30 second sit to stand by 2 repetitions showing improvment in functional strength    Status  Achieved      PT LONG TERM GOAL #3   Title  pt will decrease TUG score by 2 seconds indicating an improvement in functional gait and balance.     Baseline  6.91 sec on eval 5.79 sec on 06/27/2017 , 4.26 on 08/27/2017    Status  Achieved      PT LONG TERM GOAL #4   Title  Pt will report a decrease in pain by 50%     Status  Achieved      PT LONG TERM GOAL #5   Title  Pt will increase the strength of right shoudler flexion and abduction to 4/5     Baseline  Rt shoulder flexion 4+/5,  abduction 4/5, er 5/5 and IR 4/5-4/23/19    Status  Achieved      Additional Long Term Goals   Additional Long Term Goals  Yes      PT LONG TERM GOAL #6   Title  Pt will report tightness  and tenderness in trigger points in neck and upper traps have decreased by 50%     Baseline  Pt reports this close to 100% as long as she continues to watch-09/10/17    Status  Achieved      PT LONG TERM GOAL #7   Title  Pt will have 3/5 strength of scapular muscles when tested by "full can"  horizontal abduction in prone     Time  6    Period  Weeks    Status  New            Plan - 10/04/17 0820    Clinical Impression Statement  Pt continues to gain in strength and funciton, but still has some weakess stabalizing lower border of scapula due to lower trap weakness.  Pt is demonstrating increased body awareness and working on her specific home program     Clinical Impairments Affecting Rehab Potential  extensive surgery ; completed radiation 06/18/17    PT Treatment/Interventions  ADLs/Self Care Home Management;Therapeutic activities;Therapeutic exercise;Taping;Manual techniques;Manual lymph drainage;Compression bandaging;Passive range of motion;Scar mobilization;Patient/family education;Functional mobility training;DME Instruction;Balance training;Neuromuscular re-education;Gait training;Stair training;Dry needling    PT Next Visit Plan  focus on targeted strengthening for scpaular muscles with progressive resisitance        Patient will benefit from skilled therapeutic intervention in order to improve the following deficits and impairments:  Abnormal gait, Decreased endurance, Decreased skin integrity, Impaired sensation, Increased edema, Decreased scar mobility, Decreased knowledge of precautions, Decreased activity tolerance, Decreased knowledge of use of DME, Decreased strength, Increased fascial restricitons, Impaired UE functional use, Pain, Difficulty walking, Decreased mobility, Decreased balance, Decreased range of motion, Impaired perceived functional ability, Postural dysfunction, Increased muscle spasms  Visit Diagnosis: Aftercare following surgery for neoplasm  Acute pain of  right shoulder  Muscle weakness (generalized)     Problem List Patient Active Problem List   Diagnosis Date Noted  . 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 Squamous cell carcinoma of tongue ([place]) 04/23/2017   Donato Heinz. Owens Shark PT  [place] Levo 10/04/2017, 8:24 AM  [place], [place], [address] Phone: [phone]   Fax:  [phone]  Name: Nancy [name] MRN: [id number] Date of Birth: 1960-11-14

## 2017-10-07 ENCOUNTER — Ambulatory Visit: Payer: Medicare Other

## 2017-10-07 DIAGNOSIS — R1312 Dysphagia, oropharyngeal phase: Secondary | ICD-10-CM

## 2017-10-07 DIAGNOSIS — Z483 Aftercare following surgery for neoplasm: Secondary | ICD-10-CM | POA: Diagnosis not present

## 2017-10-07 NOTE — Therapy (Signed)
[facility] [address] [place], [place], [address] Phone: [phone]   Fax:  [phone]  Speech Language Pathology Treatment/Renewal  Patient Details  Name: Nancy [name] MRN: [id number] Date of Birth: Jan 13, 1961 Referring Provider: Eppie Gibson, MD   Encounter Date: 10/07/2017  End of Session - 10/07/17 1223    Visit Number  5    Number of Visits  7    Date for SLP Re-Evaluation  12/27/17 90 days    SLP Start Time  1150    SLP Stop Time   1223    SLP Time Calculation (min)  33 min    Activity Tolerance  Patient tolerated treatment well       Past Medical History:  Diagnosis Date  . Bipolar 1 disorder ([place])   . Cervical cancer ([place])   . GERD (gastroesophageal reflux disease)   . History of tracheostomy 03/04/2017   She had trach placed during surgery and reversed before discharge [facility])  . Hypertension   . Hypothyroid   . Manic depressive disorder ([place])   . Tongue cancer [facility])     Past Surgical History:  Procedure Laterality Date  . bone-skin graft     free osteocutaneous flap with microvascular anastomosis: other than iliac crest, metatarsal or great toe.   . cervical lymphadenectomy  03/04/2017   (modified radical neck dissection) [facility].   . EXCISION OF TONGUE LESION Right 03/04/2017   PR excis tongue, mouth, jaw. [facility])  . exploration, carotid  Left 03/04/2017   [facility].   . GLOSSECTOMY  03/04/2017   Glossectomy; composite WO Radical neck dissect [name])  . LARYNGOSCOPY  11/22/2016   Laryngoscopy, direct, operative with biopsy, [place]  03/04/2017   Reconstruct mandible/ maxil subperiosteal implant, [facility]  . PARTIAL GLOSSECTOMY    . SKIN FULL THICKNESS GRAFT  03/04/2017   [place]    . VAGINAL HYSTERECTOMY      There were no vitals filed for this visit.  Subjective Assessment - 10/07/17  1157    Subjective  "I can eat anything - I'm 98% back to normal."    Currently in Pain?  No/denies            ADULT SLP TREATMENT - 10/07/17 1158      General Information   Behavior/Cognition  Alert;Cooperative;Pleasant mood      Treatment Provided   Treatment provided  Dysphagia      Dysphagia Treatment   Temperature Spikes Noted  No    Respiratory Status  Room air    Oral Cavity - Dentition  Missing dentition    Treatment Methods  Skilled observation;Therapeutic exercise;Patient/caregiver education    Patient observed directly with PO's  Yes    Type of PO's observed  Dysphagia 3 (soft);Thin liquids    Liquids provided via  Cup    Oral Phase Signs & Symptoms  Prolonged bolus formation    Pharyngeal Phase Signs & Symptoms  -- none noted    Type of cueing  Verbal;Visual    Amount of cueing  Moderate (HEP)    Other treatment/comments  Pt jaw still out of alignment (has appointment tomorrow) and pt has tried dense meats and potato chips. Pt has done HEP once a week due to losing the HEP. SLP provided another for pt today. She req'd mod A occasionally for HEP. SLP reminded pt of necessity of doing HEP as prescribed.  Assessment / Recommendations / Plan   Plan  Continue with current plan of care      Dysphagia Recommendations   Diet recommendations  Dysphagia 3 (mechanical soft);Thin liquid diet as tolerated    Liquids provided via  Cup    Medication Administration  Whole meds with liquid    Supervision  Patient able to self feed      Progression Toward Goals   Progression toward goals  Not progressing toward goals (comment) pt regressed with exercises       SLP Education - 10/07/17 1222    Education provided  Yes    Education Details  Do HEP as prescribed, late effects head/neck CA on swallowing    Person(s) Educated  Patient    Methods  Demonstration;Explanation;Verbal cues;Handout    Comprehension  Verbalized understanding;Verbal cues required;Returned  demonstration;Need further instruction       SLP Short Term Goals - 06/10/17 1014      SLP SHORT TERM GOAL #1   Title  pt will complete HEP with rare min A    Status  Achieved      SLP SHORT TERM GOAL #2   Title  pt will tell SLP why she is completing HEP     Status  Achieved      SLP SHORT TERM GOAL #3   Title  pt will follow compensations of lt-sided mastication and lt-sided oral transit with POs    Status  Achieved      SLP SHORT TERM GOAL #4   Title  pt will tell SLP 3 overt s/s aspiration PNA with modified independence    Status  Achieved       SLP Long Term Goals - 10/07/17 1226      SLP LONG TERM GOAL #1   Title  pt will complete HEP with modified independence over two visits    Baseline  LTGs renewed 10-07-17;  08-05-17    Time  2    Period  -- visits    Status  On-going and ongoing      SLP LONG TERM GOAL #2   Title  pt will tell how a food journal can expedite return to more normalized diet    Status  Deferred due to pt returning to almost normalized diet, per her report      SLP LONG TERM GOAL #3   Title  pt will tell SLP 3 overt s/s aspiration PNA over 2 visits    Status  Achieved and ongoing       Plan - 10/07/17 1224    Clinical Impression Statement  Pt with oropharyngeal swallowing continues essentially WNL with soft foods as eaten today. Pt cont to report an even wider variety of foods consumed than last session. No overt s/s aspiration today; pt continues to admit to suboptimal compliance with HEP (due to misplacing HEP). SLP reminded pt of rationale for HEP. See "skiled intervention" for more details. The probability of further swallowing difficulty increases dramatically with the completion of radiation therapy. Pt will need to cont to be followed by SLP for regular assessment of accurate HEP completion (due to requiring mod cues today - she was SBA last session) as well as for safety with POs both during and following treatment/s.    Speech Therapy  Frequency  -- approx once every 4 weeks    Duration  -- 7 total visits    Treatment/Interventions  Aspiration precaution training;Pharyngeal strengthening exercises;Diet toleration management by SLP;Trials of upgraded texture/liquids;Internal/external  aids;Patient/family education;Compensatory strategies;SLP instruction and feedback;Environmental controls       Patient will benefit from skilled therapeutic intervention in order to improve the following deficits and impairments:   Oropharyngeal dysphagia - Plan: SLP plan of care cert/re-cert    Problem List Patient Active Problem List   Diagnosis Date Noted  . 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 Squamous cell carcinoma of tongue ([place]) 04/23/2017    Derelle Cockrell ,MS, [place]  10/07/2017, 12:30 PM  [place] [address] [place], [place], [address] Phone: [phone]   Fax:  [phone]   Name: Nancy [name] MRN: [id number] Date of Birth: 08-Dec-1960

## 2017-10-07 NOTE — Patient Instructions (Signed)
Do the HEP 6/7 days a week, at least twice each day to allow yourself the best chance to maintain optimal swallowing skills.

## 2017-10-09 ENCOUNTER — Ambulatory Visit: Payer: Medicare Other

## 2017-10-09 DIAGNOSIS — Z483 Aftercare following surgery for neoplasm: Secondary | ICD-10-CM

## 2017-10-09 DIAGNOSIS — M6281 Muscle weakness (generalized): Secondary | ICD-10-CM

## 2017-10-09 DIAGNOSIS — M25511 Pain in right shoulder: Secondary | ICD-10-CM

## 2017-10-09 NOTE — Therapy (Signed)
[place], [place], [address] Phone: [phone]   Fax:  [phone]  Physical Therapy Treatment  Patient Details  Name: Nancy [name] MRN: [id number] Date of Birth: [birth year] Referring Provider: Dr Colin Benton    Encounter Date: 10/09/2017  PT End of Session - 10/09/17 1523    Visit Number  23 kx    Number of Visits  47    Date for PT Re-Evaluation  11/17/16    PT Start Time  1435    PT Stop Time  1522    PT Time Calculation (min)  47 min    Activity Tolerance  Patient tolerated treatment well    Behavior During Therapy  [facility] for tasks assessed/performed       Past Medical History:  Diagnosis Date  . Bipolar 1 disorder ([place])   . Cervical cancer ([place])   . GERD (gastroesophageal reflux disease)   . History of tracheostomy 03/04/2017   She had trach placed during surgery and reversed before discharge [facility])  . Hypertension   . Hypothyroid   . Manic depressive disorder ([place])   . Tongue cancer [facility])     Past Surgical History:  Procedure Laterality Date  . bone-skin graft     free osteocutaneous flap with microvascular anastomosis: other than iliac crest, metatarsal or great toe.   . cervical lymphadenectomy  03/04/2017   (modified radical neck dissection) [facility].   . EXCISION OF TONGUE LESION Right 03/04/2017   PR excis tongue, mouth, jaw. [facility])  . exploration, carotid  Left 03/04/2017   [facility].   . GLOSSECTOMY  03/04/2017   Glossectomy; composite WO Radical neck dissect [facility])  . LARYNGOSCOPY  11/22/2016   Laryngoscopy, direct, operative with biopsy, [place]  03/04/2017   Reconstruct mandible/ maxil subperiosteal implant, [facility]  . PARTIAL GLOSSECTOMY    . SKIN FULL THICKNESS GRAFT  03/04/2017   [place]    . VAGINAL HYSTERECTOMY      There were no vitals filed for this  visit.  Subjective Assessment - 10/09/17 1354    Subjective  I got my results of my scans and I am cancer free! But they stretched my Lt arm to the end (demonstrating flexion) and I've had that sharp pain in my Lt upper arm since then. So I didn't get to do the exercises over the weekend because my arm was hurting too much.     Pertinent History  chemo from july til sept 28. Oct 15 surgery for neck dissection, removal tongue and jaw with reconsturction with artery, skin and fibula from left leg. She has skin graft on left leg and has pain and swelling in leg.  She had physical therapy at home up until last week and is still getting home health RN for wound care  they took 52 lymph nodes and she has had swelling and decreased range of motion of her nedk and right arm     Patient Stated Goals  to get full range of motion of neck and shoulder and get rid of pain in leg and get help with swelling in her neck .  Pt feels that these goals have been met     Currently in Pain?  No/denies                       [facility] Adult PT Treatment/Exercise - 10/09/17  0001      Shoulder Exercises: Seated   Extension  Strengthening;Right;Left;10 reps against wall    Other Seated Exercises  sitting facing mat with both forearms on foam roller , core tight and roll foam forward and back . Also did this exercise standing rolling foam roller up the wall, then "Y" in this position 10 times each side. Tactile and VCs for correct scapular engagement and to hold cervical retraction. Pt was having some increased discomfort in Lt UE from positioning during scan Friday.    Other Seated Exercises  "T" position, then ~15 degrees lower for isometric presses against wall 10 times, 3 sec holds.      Shoulder Exercises: Prone   Retraction  Both;10 reps    Retraction Limitations  VCs for correct scapular engagement    Extension  Strengthening;Right;10 reps    Extension Limitations  retraction first, then extension with  elbow straight    Other Prone Exercises  Shoulder extension with elbow extension 15 times    Other Prone Exercises  Y's with right arm with therapist assisting for correct scapular motion x 10 reps       Shoulder Exercises: Standing   Horizontal ABduction  Strengthening;Both;20 reps 20 times, then pulse 10 times at end ROM             PT Education - 10/09/17 1522    Education provided  Yes    Education Details  Do supine extension, "Y and T's" for isometric strengthening and for scapular stability (and cont same exs in prone)    Person(s) Educated  Patient    Methods  Explanation;Demonstration    Comprehension  Verbalized understanding;Returned demonstration       PT Short Term Goals - 09/25/17 0821      PT SHORT TERM GOAL #1   Title  Pt will be independent in self manual lymph draiange and use of compression to manage neck lymphedema     Status  Achieved      PT SHORT TERM GOAL #2   Title  Pt will be independent in a home exercise program for strength of UE and LE  and neck range of motion     Time  4    Period  Weeks    Status  On-going      PT SHORT TERM GOAL #3   Title  Pt will increase active range of motion of right atm to 130 indicating increase in UE strength so that she can perfom household chores easier     Status  Achieved      PT SHORT TERM GOAL #4   Title  Pt will report a decrease in overall pain by 25%    Status  Achieved        PT Long Term Goals - 09/25/17 5784      PT LONG TERM GOAL #1   Title  Pt will decrease quick DASH score to less than 10 indicating a functional improvment in left arm use     Baseline  63.64 on eval,  31.82 on 06/27/2017    Time  6    Period  Weeks    Status  Revised      PT LONG TERM GOAL #2   Title  Pt will increase 30 second sit to stand by 2 repetitions showing improvment in functional strength    Status  Achieved      PT LONG TERM GOAL #3   Title  pt will decrease TUG  score by 2 seconds indicating an improvement  in functional gait and balance.     Baseline  6.91 sec on eval 5.79 sec on 06/27/2017 , 4.26 on 08/27/2017    Status  Achieved      PT LONG TERM GOAL #4   Title  Pt will report a decrease in pain by 50%     Status  Achieved      PT LONG TERM GOAL #5   Title  Pt will increase the strength of right shoudler flexion and abduction to 4/5     Baseline  Rt shoulder flexion 4+/5, abduction 4/5, er 5/5 and IR 4/5-4/23/19    Status  Achieved      Additional Long Term Goals   Additional Long Term Goals  Yes      PT LONG TERM GOAL #6   Title  Pt will report tightness and tenderness in trigger points in neck and upper traps have decreased by 50%     Baseline  Pt reports this close to 100% as long as she continues to watch-09/10/17    Status  Achieved      PT LONG TERM GOAL #7   Title  Pt will have 3/5 strength of scapular muscles when tested by "full can"  horizontal abduction in prone     Time  6    Period  Weeks    Status  New            Plan - 10/09/17 1524    Clinical Impression Statement  Continued to focus on Rt scapular and low trap stabilty. Pt still requires some tactile cuing for correct muscular engagement but with cuing is able to correct motion/technique. She reports being able to notice improvement with her Rt UE mobility each week.     Rehab Potential  Good    Clinical Impairments Affecting Rehab Potential  extensive surgery ; completed radiation 06/18/17    PT Frequency  1x / week    PT Duration  4 weeks    PT Treatment/Interventions  ADLs/Self Care Home Management;Therapeutic activities;Therapeutic exercise;Taping;Manual techniques;Manual lymph drainage;Compression bandaging;Passive range of motion;Scar mobilization;Patient/family education;Functional mobility training;DME Instruction;Balance training;Neuromuscular re-education;Gait training;Stair training;Dry needling    PT Next Visit Plan  focus on targeted strengthening for scpaular muscles with progressive resisitance      Consulted and Agree with Plan of Care  Patient       Patient will benefit from skilled therapeutic intervention in order to improve the following deficits and impairments:  Abnormal gait, Decreased endurance, Decreased skin integrity, Impaired sensation, Increased edema, Decreased scar mobility, Decreased knowledge of precautions, Decreased activity tolerance, Decreased knowledge of use of DME, Decreased strength, Increased fascial restricitons, Impaired UE functional use, Pain, Difficulty walking, Decreased mobility, Decreased balance, Decreased range of motion, Impaired perceived functional ability, Postural dysfunction, Increased muscle spasms  Visit Diagnosis: Aftercare following surgery for neoplasm  Acute pain of right shoulder  Muscle weakness (generalized)     Problem List Patient Active Problem List   Diagnosis Date Noted  . 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 Squamous cell carcinoma of tongue ([facility]) 04/23/2017    Otelia Limes, PTA 10/09/2017, 4:17 PM  [place], [place], [address] Phone: [phone]   Fax:  [phone]  Name: Nancy [name] MRN: [id number] Date of Birth: [birth year]

## 2017-10-15 ENCOUNTER — Ambulatory Visit: Payer: Medicare Other | Admitting: Psychology

## 2017-10-15 DIAGNOSIS — F3341 Major depressive disorder, recurrent, in partial remission: Secondary | ICD-10-CM

## 2017-10-16 ENCOUNTER — Ambulatory Visit: Payer: Medicare Other

## 2017-10-16 DIAGNOSIS — M25511 Pain in right shoulder: Secondary | ICD-10-CM

## 2017-10-16 DIAGNOSIS — Z483 Aftercare following surgery for neoplasm: Secondary | ICD-10-CM | POA: Diagnosis not present

## 2017-10-16 DIAGNOSIS — M6281 Muscle weakness (generalized): Secondary | ICD-10-CM

## 2017-10-16 NOTE — Therapy (Signed)
[place], [place], [address] Phone: [phone]   Fax:  [phone]  Physical Therapy Treatment  Patient Details  Name: Nancy [name] MRN: [id number] Date of Birth: [birth year] Referring Provider: Dr Colin [place]    Encounter Date: 10/16/2017  PT End of Session - 10/16/17 1011    Visit Number  25 kx    Number of Visits  47    Date for PT Re-Evaluation  11/17/16    PT Start Time  0937    PT Stop Time  1015    PT Time Calculation (min)  38 min    Activity Tolerance  Patient tolerated treatment well    Behavior During Therapy  [facility] for tasks assessed/performed       Past Medical History:  Diagnosis Date  . Bipolar 1 disorder ([place])   . Cervical cancer ([place])   . GERD (gastroesophageal reflux disease)   . History of tracheostomy 03/04/2017   She had trach placed during surgery and reversed before discharge [facility])  . Hypertension   . Hypothyroid   . Manic depressive disorder ([place])   . Tongue cancer [facility])     Past Surgical History:  Procedure Laterality Date  . bone-skin graft     free osteocutaneous flap with microvascular anastomosis: other than iliac crest, metatarsal or great toe.   . cervical lymphadenectomy  03/04/2017   (modified radical neck dissection) [facility].   . EXCISION OF TONGUE LESION Right 03/04/2017   PR excis tongue, mouth, jaw. [facility])  . exploration, carotid  Left 03/04/2017   [facility].   . GLOSSECTOMY  03/04/2017   Glossectomy; composite WO Radical neck dissect [facility])  . LARYNGOSCOPY  11/22/2016   Laryngoscopy, direct, operative with biopsy, [place]  03/04/2017   Reconstruct mandible/ maxil subperiosteal implant, [facility]  . PARTIAL GLOSSECTOMY    . SKIN FULL THICKNESS GRAFT  03/04/2017   [place]    . VAGINAL HYSTERECTOMY      There were no vitals filed for this  visit.  Subjective Assessment - 10/16/17 0937    Subjective  My Lt shoulder flared up since I was here last bc I couldn't do something with my Rt arm. I can tell my Rt shoulder is just still weak.      Pertinent History  chemo from july til sept 28. Oct 15 surgery for neck dissection, removal tongue and jaw with reconsturction with artery, skin and fibula from left leg. She has skin graft on left leg and has pain and swelling in leg.  She had physical therapy at home up until last week and is still getting home health RN for wound care  they took 64 lymph nodes and she has had swelling and decreased range of motion of her nedk and right arm     Patient Stated Goals  to get full range of motion of neck and shoulder and get rid of pain in leg and get help with swelling in her neck .  Pt feels that these goals have been met     Currently in Pain?  No/denies                       [facility] Adult PT Treatment/Exercise - 10/16/17 0001      Shoulder Exercises: Prone   Extension  Strengthening;Right;20 reps;Weights 2x10    Extension Weight (lbs)  2  Extension Limitations  VCs for technique    Other Prone Exercises  laying on greent theraband with end hanging off bed, pt pulls this into "I" 30x (she reports this feeling easier and therapist can tell she is stabilizing at scapula much better)., then into "T" 2x10, then "Y" 2 x 10, pt reports "Y and T" the most challenging.    Other Prone Exercises  After prone theraband exs: "I, Y, and T"'s with right arm with therapist assisting for correct scapular motion x 5 reps       Shoulder Exercises: Standing   Extension  Strengthening;Both;20 reps;Theraband 2x10, then with arms a little wider x10; against ball on wal    Theraband Level (Shoulder Extension)  Level 3 (Green)    Row  Strengthening;Both;20 reps;Theraband;Limitations    Theraband Level (Shoulder Row)  Level 3 (Green) against ball on wall    Row Limitations  VCs for shoulder blade  squeeze at end of motion             PT Education - 10/16/17 1017    Education provided  Yes    Education Details  Prone "I, Y and T's" with laying on green theraband to better stabilize scapula, also showed pt a video of motion with incorrect and then correct motions, pt reported this also very beneficial     Person(s) Educated  Patient    Methods  Explanation;Demonstration    Comprehension  Verbalized understanding;Returned demonstration       PT Short Term Goals - 09/25/17 0821      PT SHORT TERM GOAL #1   Title  Pt will be independent in self manual lymph draiange and use of compression to manage neck lymphedema     Status  Achieved      PT SHORT TERM GOAL #2   Title  Pt will be independent in a home exercise program for strength of UE and LE  and neck range of motion     Time  4    Period  Weeks    Status  On-going      PT SHORT TERM GOAL #3   Title  Pt will increase active range of motion of right atm to 130 indicating increase in UE strength so that she can perfom household chores easier     Status  Achieved      PT SHORT TERM GOAL #4   Title  Pt will report a decrease in overall pain by 25%    Status  Achieved        PT Long Term Goals - 09/25/17 8921      PT LONG TERM GOAL #1   Title  Pt will decrease quick DASH score to less than 10 indicating a functional improvment in left arm use     Baseline  63.64 on eval,  31.82 on 06/27/2017    Time  6    Period  Weeks    Status  Revised      PT LONG TERM GOAL #2   Title  Pt will increase 30 second sit to stand by 2 repetitions showing improvment in functional strength    Status  Achieved      PT LONG TERM GOAL #3   Title  pt will decrease TUG score by 2 seconds indicating an improvement in functional gait and balance.     Baseline  6.91 sec on eval 5.79 sec on 06/27/2017 , 4.26 on 08/27/2017    Status  Achieved  PT LONG TERM GOAL #4   Title  Pt will report a decrease in pain by 50%     Status  Achieved       PT LONG TERM GOAL #5   Title  Pt will increase the strength of right shoudler flexion and abduction to 4/5     Baseline  Rt shoulder flexion 4+/5, abduction 4/5, er 5/5 and IR 4/5-4/23/19    Status  Achieved      Additional Long Term Goals   Additional Long Term Goals  Yes      PT LONG TERM GOAL #6   Title  Pt will report tightness and tenderness in trigger points in neck and upper traps have decreased by 50%     Baseline  Pt reports this close to 100% as long as she continues to watch-09/10/17    Status  Achieved      PT LONG TERM GOAL #7   Title  Pt will have 3/5 strength of scapular muscles when tested by "full can"  horizontal abduction in prone     Time  6    Period  Weeks    Status  New            Plan - 10/16/17 1014    Clinical Impression Statement  Continued with focus the Rt scapular stability with adding new exercises in prone with pt laying on theraband to allow lats to stabilize scapula better. Pt reporting feeling much improvement with motion and therapist able to see better scapular rhythm . Pt to add these to HEP program.     Rehab Potential  Good    Clinical Impairments Affecting Rehab Potential  extensive surgery ; completed radiation 06/18/17    PT Frequency  1x / week    PT Duration  4 weeks    PT Treatment/Interventions  ADLs/Self Care Home Management;Therapeutic activities;Therapeutic exercise;Taping;Manual techniques;Manual lymph drainage;Compression bandaging;Passive range of motion;Scar mobilization;Patient/family education;Functional mobility training;DME Instruction;Balance training;Neuromuscular re-education;Gait training;Stair training;Dry needling    PT Next Visit Plan  cont to focus on targeted strengthening for scapular muscles with progressive resistance; review new exercises issued today     Consulted and Agree with Plan of Care  Patient       Patient will benefit from skilled therapeutic intervention in order to improve the following deficits  and impairments:  Abnormal gait, Decreased endurance, Decreased skin integrity, Impaired sensation, Increased edema, Decreased scar mobility, Decreased knowledge of precautions, Decreased activity tolerance, Decreased knowledge of use of DME, Decreased strength, Increased fascial restricitons, Impaired UE functional use, Pain, Difficulty walking, Decreased mobility, Decreased balance, Decreased range of motion, Impaired perceived functional ability, Postural dysfunction, Increased muscle spasms  Visit Diagnosis: Aftercare following surgery for neoplasm  Acute pain of right shoulder  Muscle weakness (generalized)     Problem List Patient Active Problem List   Diagnosis Date Noted  . 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 Squamous cell carcinoma of tongue ([facility]) 04/23/2017    Otelia Limes, PTA 10/16/2017, 10:19 AM  [place], [place], [address] Phone: [phone]   Fax:  [phone]  Name: AYAT DRENNING MRN: [id number] Date of Birth: 1961/02/20

## 2017-10-21 NOTE — Progress Notes (Signed)
Ms. Acheampong presents for follow up of radiation completed 06/18/17 to her Head and neck/ Tongue.    Pain issues, if any: She reports burning throat pain at times.  Using a feeding tube?: No Weight changes, if any:  Wt Readings from Last 3 Encounters:  10/25/17 175 lb 9.6 oz (79.7 kg)  08/05/17 171 lb 12.8 oz (77.9 kg)  07/23/17 168 lb (76.2 kg)   Swallowing issues, if any: She denies. She does need to drink water with meals to moisten her food.  Smoking or chewing tobacco? No Using fluoride trays daily? Yes, almost daily.  Last ENT visit was on:  Other notable issues, if any:  Dr. Trenton [facility]) 10/08/17 CT neck/ chest 10/04/17 at [facility] CT neck FINDINGS: Please note, evaluation of soft tissues of the neck is limited in the absence of IV contrast. The visualized portions of the brain and the posterior fossa are normal. Sequela of prior right floor of mouth/mandibular mass resection including composite right mandibular resection and right neck dissection, with fibular free flap reconstruction. Streak artifact from the patient's mandibular fixation and dental hardware somewhat obscures the adjacent soft tissue, however, no discrete mass or nodule is appreciated within the resection site. Soft tissue thickening within the right neck, including thickening of the anterior cortex fold and mucosal and the region of the critical sinuses favored to be treatment related. The paranasal sinuses, orbits, nasopharynx, and oropharynx are normal. The salivary glands, including the parotid glands and left submandibular gland are normal. The larynx, hypopharynx, and thyroid gland are normal. Scattered prominent, but nonpathologically enlarged, lymph nodes are noted within levels II, III, and IV the left, and are likely reactive. No new suspicious osseous lesions are identified. For findings below the level of thoracic inlet, please see separately dictated report for chest CT.  CT chest  10/04/17 IMPRESSION: Unchanged 0.6 cm nodule along the left major fissure compared to 11/16/2016. No new lung nodule  BP (!) 141/80   Pulse 68   Temp 98.5 F (36.9 C)   Ht 5\' 5"  (1.651 m)   Wt 175 lb 9.6 oz (79.7 kg)   SpO2 95% Comment: room air  BMI 29.22 kg/m

## 2017-10-23 ENCOUNTER — Ambulatory Visit: Payer: Self-pay | Admitting: Radiation Oncology

## 2017-10-24 ENCOUNTER — Ambulatory Visit: Payer: Medicare Other | Attending: Otolaryngology | Admitting: Physical Therapy

## 2017-10-24 ENCOUNTER — Encounter: Payer: Self-pay | Admitting: Physical Therapy

## 2017-10-24 DIAGNOSIS — Z483 Aftercare following surgery for neoplasm: Secondary | ICD-10-CM | POA: Diagnosis present

## 2017-10-24 DIAGNOSIS — M6281 Muscle weakness (generalized): Secondary | ICD-10-CM | POA: Insufficient documentation

## 2017-10-24 DIAGNOSIS — M79622 Pain in left upper arm: Secondary | ICD-10-CM | POA: Diagnosis present

## 2017-10-24 DIAGNOSIS — M25511 Pain in right shoulder: Secondary | ICD-10-CM | POA: Insufficient documentation

## 2017-10-24 DIAGNOSIS — I890 Lymphedema, not elsewhere classified: Secondary | ICD-10-CM | POA: Diagnosis present

## 2017-10-24 NOTE — Therapy (Signed)
[place], [place], [address] Phone: [phone]   Fax:  [phone]  Physical Therapy Treatment  Patient Details  Name: Nancy [name] MRN: [id number] Date of Birth: 1960/10/30 Referring Provider: Dr Colin Benton    Encounter Date: 10/24/2017  PT End of Session - 10/24/17 1954    Visit Number  [id number]    Date for PT Re-Evaluation  11/17/16    PT Start Time  1020    PT Stop Time  1100    PT Time Calculation (min)  40 min    Activity Tolerance  Patient tolerated treatment well    Behavior During Therapy  [facility] for tasks assessed/performed       Past Medical History:  Diagnosis Date  . Bipolar 1 disorder ([place])   . Cervical cancer ([place])   . GERD (gastroesophageal reflux disease)   . History of tracheostomy 03/04/2017   She had trach placed during surgery and reversed before discharge [facility])  . Hypertension   . Hypothyroid   . Manic depressive disorder ([place])   . Tongue cancer [facility])     Past Surgical History:  Procedure Laterality Date  . bone-skin graft     free osteocutaneous flap with microvascular anastomosis: other than iliac crest, metatarsal or great toe.   . cervical lymphadenectomy  03/04/2017   (modified radical neck dissection) [facility].   . EXCISION OF TONGUE LESION Right 03/04/2017   PR excis tongue, mouth, jaw. [facility])  . exploration, carotid  Left 03/04/2017   [facility].   . GLOSSECTOMY  03/04/2017   Glossectomy; composite WO Radical neck dissect [facility])  . LARYNGOSCOPY  11/22/2016   Laryngoscopy, direct, operative with biopsy, [place]  03/04/2017   Reconstruct mandible/ maxil subperiosteal implant, [facility]  . PARTIAL GLOSSECTOMY    . SKIN FULL THICKNESS GRAFT  03/04/2017   [place]    . VAGINAL HYSTERECTOMY      There were no vitals filed for this visit.  Subjective Assessment -  10/24/17 1034    Subjective  Pt states she still has some pain in left upper arm ( pt points to area in mid lateal upper arm.  She says she is being active at home in the yard and not doing her home exercise program  She is very excited about how good her yard is looking and the pain is not keeping from doing what she wants to do     Pertinent History  chemo from july til sept 28. Oct 15 surgery for neck dissection, removal tongue and jaw with reconsturction with artery, skin and fibula from left leg. She has skin graft on left leg and has pain and swelling in leg.  She had physical therapy at home up until last week and is still getting home health RN for wound care  they took 40 lymph nodes and she has had swelling and decreased range of motion of her nedk and right arm     Patient Stated Goals  to get full range of motion of neck and shoulder and get rid of pain in leg and get help with swelling in her neck .  Pt feels that these goals have been met     Currently in Pain?  Yes    Pain Score  1     Pain Location  Arm    Pain Orientation  Left;Upper;Lateral  Pain Descriptors / Indicators  Sore    Pain Type  Chronic pain                       [facility] Adult PT Treatment/Exercise - 10/24/17 0001      Lumbar Exercises: Supine   Dead Bug  10 reps    Bridge  10 reps      Shoulder Exercises: Supine   Protraction  Strengthening;Both;20 reps    Protraction Weight (lbs)  10 reps with no weight and 10 reps with 2# in each arm     Flexion  Strengthening;Both;20 reps;Weights    Shoulder Flexion Weight (lbs)  2 cues to kepp core tight.only raising armup about 30 degrees     Other Supine Exercises  with hand pointed to celining and 4# weight, small contolled circles of various sizes for shoulder control  cant doing it with left arm due to pain     Other Supine Exercises  4 pound weight bicep curl with each arm       Shoulder Exercises: Standing   Row  Strengthening;Right;Left;20  reps;Theraband    Theraband Level (Shoulder Row)  Level 3 (Green)    Row Limitations  cues to keep elbows straight, bilateral and unilateral     Other Standing Exercises  wall push ups x 3 with 10 sec hover at wall       Neck Exercises: Stretches   Other Neck Stretches  neck ROM in sitting with cues to keep shoulders down away from ears               PT Short Term Goals - 09/25/17 0821      PT SHORT TERM GOAL #1   Title  Pt will be independent in self manual lymph draiange and use of compression to manage neck lymphedema     Status  Achieved      PT SHORT TERM GOAL #2   Title  Pt will be independent in a home exercise program for strength of UE and LE  and neck range of motion     Time  4    Period  Weeks    Status  On-going      PT SHORT TERM GOAL #3   Title  Pt will increase active range of motion of right atm to 130 indicating increase in UE strength so that she can perfom household chores easier     Status  Achieved      PT SHORT TERM GOAL #4   Title  Pt will report a decrease in overall pain by 25%    Status  Achieved        PT Long Term Goals - 09/25/17 1610      PT LONG TERM GOAL #1   Title  Pt will decrease quick DASH score to less than 10 indicating a functional improvment in left arm use     Baseline  63.64 on eval,  31.82 on 06/27/2017    Time  6    Period  Weeks    Status  Revised      PT LONG TERM GOAL #2   Title  Pt will increase 30 second sit to stand by 2 repetitions showing improvment in functional strength    Status  Achieved      PT LONG TERM GOAL #3   Title  pt will decrease TUG score by 2 seconds indicating an improvement in functional gait and balance.  Baseline  6.91 sec on eval 5.79 sec on 06/27/2017 , 4.26 on 08/27/2017    Status  Achieved      PT LONG TERM GOAL #4   Title  Pt will report a decrease in pain by 50%     Status  Achieved      PT LONG TERM GOAL #5   Title  Pt will increase the strength of right shoudler flexion and  abduction to 4/5     Baseline  Rt shoulder flexion 4+/5, abduction 4/5, er 5/5 and IR 4/5-4/23/19    Status  Achieved      Additional Long Term Goals   Additional Long Term Goals  Yes      PT LONG TERM GOAL #6   Title  Pt will report tightness and tenderness in trigger points in neck and upper traps have decreased by 50%     Baseline  Pt reports this close to 100% as long as she continues to watch-09/10/17    Status  Achieved      PT LONG TERM GOAL #7   Title  Pt will have 3/5 strength of scapular muscles when tested by "full can"  horizontal abduction in prone     Time  6    Period  Weeks    Status  New            Plan - 10/24/17 1955    Clinical Impression Statement  Pt continues to improve functionally and is doing more around her home, but has pain in left arm. She would benefit from continued strengthening with progressive resisitance.     Rehab Potential  Good    PT Frequency  1x / week    PT Duration  4 weeks    PT Next Visit Plan   Reassess goals cont to focus on targeted strengthening for scapular muscles with progressive resistance and weights . continue to encourage Liv[employer], Tai chi and yoga     Consulted and Agree with Plan of Care  Patient       Patient will benefit from skilled therapeutic intervention in order to improve the following deficits and impairments:  Abnormal gait, Decreased endurance, Decreased skin integrity, Impaired sensation, Increased edema, Decreased scar mobility, Decreased knowledge of precautions, Decreased activity tolerance, Decreased knowledge of use of DME, Decreased strength, Increased fascial restricitons, Impaired UE functional use, Pain, Difficulty walking, Decreased mobility, Decreased balance, Decreased range of motion, Impaired perceived functional ability, Postural dysfunction, Increased muscle spasms, Improper body mechanics  Visit Diagnosis: Aftercare following surgery for neoplasm  Acute pain of right shoulder  Muscle  weakness (generalized)  Lymphedema, not elsewhere classified  Pain in left upper arm     Problem List Patient Active Problem List   Diagnosis Date Noted  . 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 Squamous cell carcinoma of tongue ([place]) 04/23/2017   Donato Heinz. Owens Shark PT  [place] 10/24/2017, [place], [place], [address] Phone: [phone]   Fax:  [phone]  Name: Nancy [name] MRN: [id number] Date of Birth: 02/05/1961

## 2017-10-25 ENCOUNTER — Ambulatory Visit
Admission: RE | Admit: 2017-10-25 | Discharge: 2017-10-25 | Disposition: A | Discharge status: Home / Self Care | Payer: Medicare Other | Source: Ambulatory Visit | Attending: Radiation Oncology | Admitting: Radiation Oncology

## 2017-10-25 ENCOUNTER — Encounter: Payer: Self-pay | Admitting: *Deleted

## 2017-10-25 ENCOUNTER — Other Ambulatory Visit: Payer: Self-pay

## 2017-10-25 ENCOUNTER — Encounter: Payer: Self-pay | Admitting: Radiation Oncology

## 2017-10-25 VITALS — BP 141/80 | HR 68 | Temp 98.5°F | Ht 65.0 in | Wt 175.6 lb

## 2017-10-25 DIAGNOSIS — R070 Pain in throat: Secondary | ICD-10-CM | POA: Insufficient documentation

## 2017-10-25 DIAGNOSIS — Z79899 Other long term (current) drug therapy: Secondary | ICD-10-CM | POA: Diagnosis not present

## 2017-10-25 DIAGNOSIS — Z923 Personal history of irradiation: Secondary | ICD-10-CM | POA: Insufficient documentation

## 2017-10-25 DIAGNOSIS — C021 Malignant neoplasm of border of tongue: Secondary | ICD-10-CM | POA: Diagnosis not present

## 2017-10-25 NOTE — Progress Notes (Signed)
Oncology Nurse Navigator Documentation  To provide care continuity, met with Nancy [name] and her step-mother during 6-monthpost-RT follow-up with Dr. SIsidore Moos  She completed adjuvant RT 06/18/17 s/p 03/04/17 R glossectomy/R modified neck dissection. She noted:  [facility] with planned f/u in 3 months.   Still having jaw alignment issues but she adapted.  Stepmother indicated possible corrective procedure in future.  Conducting HEP twice daily.  Using fluoride trays daily.  [facility] We discussed lingering SEs (dry mouth, throat sensitivity), expectation for improvement. She will have f/u with Dr SIsidore Moosin mid-November. She understands to contact me with needs/concerns.  RGayleen Orem RN, BSN Head & Neck Oncology Nurse [phone]

## 2017-10-25 NOTE — Progress Notes (Signed)
Radiation Oncology         [phone] ________________________________  Name: Nancy [name] MRN: [id number]  Date: 10/25/2017  DOB: 1960/11/11  Follow-Up Visit Note  CC: Patient, No Pcp Per  Colin Benton, MD  Diagnosis and Prior Radiotherapy:       ICD-10-CM   1. 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  CHIEF COMPLAINT:  Here for follow-up and surveillance of head and neck cancer  Narrative:  The patient returns today for routine follow-up to radiation therapy completed 06/18/17. Since our last follow up visit patient was encourage to see otolaryngology at [facility] and to have CT of her neck and chest. Patient saw Dr. [place] Callas [facility]) on 07/30/17 and was scheduled for a CT of the neck and chest 3 month post treatment. CT of neck and chest was done 10/04/2017 which revealed soft tissue thickening with the right neck, including thickening of the anterior cortex fold and mucosal and the region of the critical sinuses favored to be treatment related. Scattered prominent, but non pathologically enlarged, lymph nodes are noted within levels II, III, and IV likely reactive. No new suspicious osseous lesions were identified. There was also an unchanged 0.6 cm nodule along the left major fissure compared to 11/17/2015, and no new lung nodule. I personally reviewed the reports but I don't have the imaging at this time.  Today she reports burning throat pain at times. She denies any swallowing issues but does report she needs to drink water with meals to moisten food. She does not have a feeding tube. She reported that she has been doing better regarding jaw, but will still seek out options to jaw alignments. She expressed that salty food burns her throat and having to cook more to avoid  burning.  Steroid taper helped a bit for the throat pain. Dr Fayette Callas noted mild inflammation in the throat on last exam.She reports that she feels that she is in a great mental state then before and feels good.              ALLERGIES:  is allergic to amoxicillin and iodinated diagnostic agents.  Meds: Current Outpatient Medications  Medication Sig Dispense Refill  . acetaminophen (TYLENOL) 500 MG tablet Take 1,000 mg by mouth every 6 (six) hours as needed for mild pain.     . chlorthalidone (HYGROTON) 25 MG tablet Take 25 mg by mouth daily.     Marland Kitchen DEXILANT 60 MG capsule Take 60 mg by mouth daily.     Marland Kitchen ibuprofen (ADVIL,MOTRIN) 200 MG tablet Take 400 mg by mouth every 6 (six) hours as needed for mild pain.     Marland Kitchen lamoTRIgine (LAMICTAL) 150 MG tablet Take 300 mg by mouth daily.    Marland Kitchen levothyroxine (SYNTHROID, LEVOTHROID) 25 MCG tablet Take 25 mcg by mouth daily before breakfast.     . losartan (COZAAR) 100 MG tablet Take 100 mg by mouth daily.     Marland Kitchen PARoxetine (PAXIL) 40 MG tablet Take 40 mg by mouth daily.    . sodium fluoride (FLUORISHIELD) 1.1 % GEL dental gel Instill 1 drop of gel per tooth space of fluoride tray.  Place over teeth for 5 minutes.  Remove.  Spit out excess.  Repeat nightly. 120 mL prn  .  lidocaine (XYLOCAINE) 2 % solution Patient: Mix 1part 2% viscous lidocaine, 1part H20. Swish & swallow 105mL of diluted mixture, 51min before meals and at bedtime, up to QID (Patient not taking: Reported on 10/25/2017) 300 mL 2  . magic mouthwash w/lidocaine SOLN Take 5 mLs by mouth 4 (four) times daily as needed for mouth pain. (Patient not taking: Reported on 07/09/2017) 200 mL 0  . ondansetron (ZOFRAN) 8 MG tablet Take 1 tablet (8 mg total) by mouth every 8 (eight) hours as needed for nausea or vomiting. (Patient not taking: Reported on 07/09/2017) 30 tablet 2  . sucralfate (CARAFATE) 1 g tablet Dissolve 1 tablet in 10 mL H20 and swallow 30 min prior to meals and bedtime. (Patient not taking:  Reported on 07/09/2017) 48 tablet 5   No current facility-administered medications for this encounter.     Physical Findings: The patient is in no acute distress. Patient is alert and oriented. Wt Readings from Last 3 Encounters:  10/25/17 175 lb 9.6 oz (79.7 kg)  08/05/17 171 lb 12.8 oz (77.9 kg)  07/23/17 168 lb (76.2 kg)    height is 5\' 5"  (1.651 m) and weight is 175 lb 9.6 oz (79.7 kg). Her temperature is 98.5 F (36.9 C). Her blood pressure is 141/80 (abnormal) and her pulse is 68. Her oxygen saturation is 95%. .  General: Alert and oriented, in no acute distress HEENT: Head is normocephalic. Extraocular movements are intact. Mucos membranes are somewhat dry. No thrush, and no visible lesions, in the oral cavities or oropharynx. No palpable masses in the mouth. Neck: Neck is notable for very mild lymphadema, no palpable masses and skin has healed beautifully. Skin: Skin in treatment fields shows satisfactory healing. Lymphatics: see Neck Exam Psychiatric: Judgment and insight are intact. Affect is appropriate.   Lab Findings: Lab Results  Component Value Date   WBC 9.1 06/04/2017   HGB 13.7 06/04/2017   HCT 39.3 06/04/2017   MCV 90.6 06/04/2017   PLT 257 06/04/2017    No results found for: TSH  Radiographic Findings: No results found.  Impression/Plan:    1) Head and Neck Cancer Status: Patient is recovering very well from radiation treatment. NED.  2) Nutritional Status: Overall she is able to eat well, she was recommended to eat less acidic, less salty foods. PEG tube: No   3) Risk Factors: The patient has been educated about risk factors including alcohol and tobacco abuse; they understand that avoidance of alcohol and tobacco is important to prevent recurrences as well as other cancers  4) Swallowing: No issues at this time, just needs to drink water to help with moistening food.  5) Dental: Encouraged to continue regular followup with dentistry, and dental  hygiene including fluoride rinses.   6) Thyroid function: No results found for: TSH May check annually w/ PCP.  Unlikely to be affected by RT which was unilateral and not comprehensive to whole neck  7) Other: She will see Dr. Marietta Callas August 22nd, 2019. She will continue to see physical therapy.  8) Follow-up in 5 months, towards the end of November. The patient was encouraged to call with any issues or questions before then.   Try sucralfate, lidocaine with eating, to help throat soreness.  Patient was given an informational document that our care team curated for head and neck cancer survivors to help with symptom management and overall post-treatment wellness. Phone number of Gayleen Orem, RN, our Head and Neck Oncology Navigator was provided in case any  questions arise.   I spent 30 minutes minutes face to face with the patient and more than 50% of that time was spent in counseling and/or coordination of care.    _____________________________________   Eppie Gibson, MD  This document serves as a record of services personally performed by Eppie Gibson MD. It was created on her behalf by Delton Coombes, a trained medical scribe. The creation of this record is based on the scribe's personal observations and the provider's statements to them.

## 2017-10-28 ENCOUNTER — Ambulatory Visit: Payer: Medicare Other

## 2017-10-28 DIAGNOSIS — M25511 Pain in right shoulder: Secondary | ICD-10-CM

## 2017-10-28 DIAGNOSIS — M79622 Pain in left upper arm: Secondary | ICD-10-CM

## 2017-10-28 DIAGNOSIS — M6281 Muscle weakness (generalized): Secondary | ICD-10-CM

## 2017-10-28 DIAGNOSIS — Z483 Aftercare following surgery for neoplasm: Secondary | ICD-10-CM | POA: Diagnosis not present

## 2017-10-28 NOTE — Therapy (Signed)
[place], [place], [address] Phone: [phone]   Fax:  [phone]  Physical Therapy Treatment  Patient Details  Name: Nancy [name] MRN: [id number] Date of Birth: 1960/06/25 Referring Provider: Dr Colin Benton    Encounter Date: 10/28/2017  PT End of Session - 10/28/17 1103    Visit Number  3 kx    Number of Visits  47    Date for PT Re-Evaluation  11/17/16    PT Start Time  1020    PT Stop Time  1103    PT Time Calculation (min)  43 min    Activity Tolerance  Patient tolerated treatment well    Behavior During Therapy  [facility] for tasks assessed/performed       Past Medical History:  Diagnosis Date  . Bipolar 1 disorder ([place])   . Cervical cancer ([place])   . GERD (gastroesophageal reflux disease)   . History of tracheostomy 03/04/2017   She had trach placed during surgery and reversed before discharge [facility])  . Hypertension   . Hypothyroid   . Manic depressive disorder ([place])   . Tongue cancer [facility])     Past Surgical History:  Procedure Laterality Date  . bone-skin graft     free osteocutaneous flap with microvascular anastomosis: other than iliac crest, metatarsal or great toe.   . cervical lymphadenectomy  03/04/2017   (modified radical neck dissection) [facility]   . EXCISION OF TONGUE LESION Right 03/04/2017   PR excis tongue, mouth, jaw. [facility]  . exploration, carotid  Left 03/04/2017   [facility].   . GLOSSECTOMY  03/04/2017   Glossectomy; composite WO Radical neck dissect [facility])  . LARYNGOSCOPY  11/22/2016   Laryngoscopy, direct, operative with biopsy, [place]  03/04/2017   Reconstruct mandible/ maxil subperiosteal implant, [facility]  . PARTIAL GLOSSECTOMY    . SKIN FULL THICKNESS GRAFT  03/04/2017   [place]    . VAGINAL HYSTERECTOMY      There were no vitals filed for this  visit.  Subjective Assessment - 10/28/17 1022    Subjective  I saw Dr. Isidore Moos last week and she doesn't need to see my back for 3 months. My biggest complaint still at this time is the sore at the back of my throat so I still can't have tomato based and [place] food yet but Dr. Isidore Moos feels pretty confident that within a year that will improve. That same spot on in my Lt liddle lateral arm is hurting from doing some of the exercises yesterday. But it's feeling better this morning. Just sore.     Pertinent History  chemo from july til sept 28. Oct 15 surgery for neck dissection, removal tongue and jaw with reconsturction with artery, skin and fibula from left leg. She has skin graft on left leg and has pain and swelling in leg.  She had physical therapy at home up until last week and is still getting home health RN for wound care  they took 85 lymph nodes and she has had swelling and decreased range of motion of her nedk and right arm     Patient Stated Goals  to get full range of motion of neck and shoulder and get rid of pain in leg and get help with swelling in her neck .  Pt feels that these goals have been met     Currently in Pain?  No/denies                       [facility] Adult PT Treatment/Exercise - 10/28/17 0001      Lumbar Exercises: Supine   Dead Bug  10 reps    Bridge  10 reps;5 seconds hands on stomach for increased difficulty      Shoulder Exercises: Supine   Protraction  Strengthening;Both;20 reps;Weights    Protraction Weight (lbs)  2 x 10 with 3 lbs    Flexion  Strengthening;Both;20 reps;Weights;Limitations VCs to keep core engaged and only raise arm ~30 degrees    Shoulder Flexion Weight (lbs)  2    Flexion Limitations  Only weight with Rt arm as pt was having increased pain in Lt with weight    Other Supine Exercises  with hand pointed to celining and 3# weight, small contolled CW and CCW circles of various sizes for shoulder control, no weight with Lt arm due to  pain; VCs throughout for scapular engagment and proper scapular rhythm    Other Supine Exercises  4# weight bicep curl with each arm 2x15               PT Short Term Goals - 09/25/17 3790      PT SHORT TERM GOAL #1   Title  Pt will be independent in self manual lymph draiange and use of compression to manage neck lymphedema     Status  Achieved      PT SHORT TERM GOAL #2   Title  Pt will be independent in a home exercise program for strength of UE and LE  and neck range of motion     Time  4    Period  Weeks    Status  On-going      PT SHORT TERM GOAL #3   Title  Pt will increase active range of motion of right atm to 130 indicating increase in UE strength so that she can perfom household chores easier     Status  Achieved      PT SHORT TERM GOAL #4   Title  Pt will report a decrease in overall pain by 25%    Status  Achieved        PT Long Term Goals - 09/25/17 2409      PT LONG TERM GOAL #1   Title  Pt will decrease quick DASH score to less than 10 indicating a functional improvment in left arm use     Baseline  63.64 on eval,  31.82 on 06/27/2017    Time  6    Period  Weeks    Status  Revised      PT LONG TERM GOAL #2   Title  Pt will increase 30 second sit to stand by 2 repetitions showing improvment in functional strength    Status  Achieved      PT LONG TERM GOAL #3   Title  pt will decrease TUG score by 2 seconds indicating an improvement in functional gait and balance.     Baseline  6.91 sec on eval 5.79 sec on 06/27/2017 , 4.26 on 08/27/2017    Status  Achieved      PT LONG TERM GOAL #4   Title  Pt will report a decrease in pain by 50%     Status  Achieved      PT LONG TERM GOAL #5   Title  Pt will increase the strength of  right shoudler flexion and abduction to 4/5     Baseline  Rt shoulder flexion 4+/5, abduction 4/5, er 5/5 and IR 4/5-4/23/19    Status  Achieved      Additional Long Term Goals   Additional Long Term Goals  Yes      PT LONG TERM  GOAL #6   Title  Pt will report tightness and tenderness in trigger points in neck and upper traps have decreased by 50%     Baseline  Pt reports this close to 100% as long as she continues to watch-09/10/17    Status  Achieved      PT LONG TERM GOAL #7   Title  Pt will have 3/5 strength of scapular muscles when tested by "full can"  horizontal abduction in prone     Time  6    Period  Weeks    Status  New            Plan - 10/28/17 1110    Clinical Impression Statement  Pt still with pain in Lt upper arm that becomes excarbated with increased activity and exercising. But pt knows this is part of her healing process and feels confident that she knows increased postural awareness helps to decrease these symptoms.  She did have some increase in her upper extremity pain by end of session (though did not push pain while exercising today) but she reports knowing this is normal and will take Tylenol later prn for pain and cont to watch her posture.    Rehab Potential  Good    Clinical Impairments Affecting Rehab Potential  extensive surgery ; completed radiation 06/18/17    PT Frequency  1x / week    PT Duration  4 weeks    PT Next Visit Plan   Reassess goals; add circuit training for pt to HEP (including deltoid stength) to see if this will be easier for pt to be consistent with      Consulted and Agree with Plan of Care  Patient       Patient will benefit from skilled therapeutic intervention in order to improve the following deficits and impairments:  Abnormal gait, Decreased endurance, Decreased skin integrity, Impaired sensation, Increased edema, Decreased scar mobility, Decreased knowledge of precautions, Decreased activity tolerance, Decreased knowledge of use of DME, Decreased strength, Increased fascial restricitons, Impaired UE functional use, Pain, Difficulty walking, Decreased mobility, Decreased balance, Decreased range of motion, Impaired perceived functional ability, Postural  dysfunction, Increased muscle spasms, Improper body mechanics  Visit Diagnosis: Aftercare following surgery for neoplasm  Acute pain of right shoulder  Muscle weakness (generalized)  Pain in left upper arm     Problem List Patient Active Problem List   Diagnosis Date Noted  . Squamous cell carcinoma of lateral tongue ([place]) 04/24/2017  . 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 Squamous cell carcinoma of tongue ([facility]) 04/23/2017    Otelia Limes, PTA 10/28/2017, 12:04 PM  [place], [place], [address] Phone: [phone]   Fax:  [phone]  Name: Nancy [name] MRN: [id number] Date of Birth: 1960/06/07

## 2017-11-06 ENCOUNTER — Ambulatory Visit: Payer: Self-pay | Admitting: Radiation Oncology

## 2017-11-06 ENCOUNTER — Ambulatory Visit: Payer: Medicare Other | Admitting: Physical Therapy

## 2017-11-06 ENCOUNTER — Encounter: Payer: Self-pay | Admitting: Physical Therapy

## 2017-11-06 DIAGNOSIS — Z483 Aftercare following surgery for neoplasm: Secondary | ICD-10-CM | POA: Diagnosis not present

## 2017-11-06 DIAGNOSIS — M25511 Pain in right shoulder: Secondary | ICD-10-CM

## 2017-11-06 DIAGNOSIS — I890 Lymphedema, not elsewhere classified: Secondary | ICD-10-CM

## 2017-11-06 DIAGNOSIS — M6281 Muscle weakness (generalized): Secondary | ICD-10-CM

## 2017-11-06 DIAGNOSIS — M79622 Pain in left upper arm: Secondary | ICD-10-CM

## 2017-11-06 NOTE — Therapy (Signed)
[place], [place], [address] Phone: [phone]   Fax:  [phone]  Physical Therapy Treatment  Patient Details  Name: Nancy [name] MRN: [id number] Date of Birth: 1960/11/27 Referring Provider: Dr Colin Benton    Encounter Date: 11/06/2017  PT End of Session - 11/06/17 1333    Visit Number  87 kx    Number of Visits  47    Date for PT Re-Evaluation  11/17/16    PT Start Time  1100    PT Stop Time  1145    PT Time Calculation (min)  45 min    Activity Tolerance  Patient tolerated treatment well    Behavior During Therapy  [facility] for tasks assessed/performed       Past Medical History:  Diagnosis Date  . Bipolar 1 disorder ([place])   . Cervical cancer ([place])   . GERD (gastroesophageal reflux disease)   . History of tracheostomy 03/04/2017   She had trach placed during surgery and reversed before discharge [facility])  . Hypertension   . Hypothyroid   . Manic depressive disorder ([place])   . Tongue cancer [facility])     Past Surgical History:  Procedure Laterality Date  . bone-skin graft     free osteocutaneous flap with microvascular anastomosis: other than iliac crest, metatarsal or great toe.   . cervical lymphadenectomy  03/04/2017   (modified radical neck dissection) [facility].   . EXCISION OF TONGUE LESION Right 03/04/2017   PR excis tongue, mouth, jaw. [facility])  . exploration, carotid  Left 03/04/2017   [facility].   . GLOSSECTOMY  03/04/2017   Glossectomy; composite WO Radical neck dissect [facility])  . LARYNGOSCOPY  11/22/2016   Laryngoscopy, direct, operative with biopsy, [place]  03/04/2017   Reconstruct mandible/ maxil subperiosteal implant, [facility]  . PARTIAL GLOSSECTOMY    . SKIN FULL THICKNESS GRAFT  03/04/2017   [place]    . VAGINAL HYSTERECTOMY      There were no vitals filed for this  visit.  Subjective Assessment - 11/06/17 1023    Subjective  Pt states she is doing well overall, but she still has some pain in her left arm occasionally.  She feels that her lymphedema is under control, but she knows to do self manual lymph drainage and use her compression when occasionally she feels full.     Pertinent History  chemo from july til sept 28. Oct 15 surgery for neck dissection, removal tongue and jaw with reconsturction with artery, skin and fibula from left leg. She has skin graft on left leg and has pain and swelling in leg.  She had physical therapy at home up until last week and is still getting home health RN for wound care  they took 30 lymph nodes and she has had swelling and decreased range of motion of her nedk and right arm     Patient Stated Goals  to get full range of motion of neck and shoulder and get rid of pain in leg and get help with swelling in her neck .  Pt feels that these goals have been met     Currently in Pain?  Yes    Pain Score  -- .5     Pain Location  Arm    Pain Orientation  Right;Upper;Lateral    Pain Descriptors / Indicators  Sore    Pain Type  Chronic pain    Pain Radiating Towards  no    Pain Onset  More than a month ago    Pain Frequency  Intermittent    Aggravating Factors   just some motions that cause it     Pain Relieving Factors  ibuporfen          [facility] PT Assessment - 11/06/17 0001      Observation/Other Assessments   Quick DASH   31.82      AROM   Right Shoulder Flexion  165 Degrees    Right Shoulder ABduction  175 Degrees decreased scapular stabalization with abuction, not flexion       Strength   Overall Strength Comments  scapular muscles 3-/5 when tested in prone            Quick Dash - 11/06/17 0001    Open a tight or new jar  Moderate difficulty    Do heavy household chores (wash walls, wash floors)  Moderate difficulty    Carry a shopping bag or briefcase  Moderate difficulty    Wash your back  Mild  difficulty    Use a knife to cut food  No difficulty    Recreational activities in which you take some force or impact through your arm, shoulder, or hand (golf, hammering, tennis)  Severe difficulty    During the past week, to what extent has your arm, shoulder or hand problem interfered with your normal social activities with family, friends, neighbors, or groups?  Slightly    During the past week, to what extent has your arm, shoulder or hand problem limited your work or other regular daily activities  Slightly    Arm, shoulder, or hand pain.  Mild    Tingling (pins and needles) in your arm, shoulder, or hand  None    Difficulty Sleeping  Mild difficulty    DASH Score  31.82 %             [facility] Adult PT Treatment/Exercise - 11/06/17 0001      Self-Care   Self-Care  Other Self-Care Comments    Other Self-Care Comments   pt is doing her home exercises for posture and arm strenthening.  She is doing her yard work for exercise and she wants to start walking. and continue a community exercise program and get back into yoga       Manual Therapy   Soft tissue mobilization  in sitting, with massage cream, soft tissue work to tight areas in bilateral upper trap areas                 PT Education - 11/06/17 1332    Education provided  Yes    Education Details  encouraged pt to continue with community exercise     Person(s) Educated  Patient    Methods  Explanation    Comprehension  Verbalized understanding       PT Short Term Goals - 11/06/17 1337      PT SHORT TERM GOAL #1   Title  Pt will be independent in self manual lymph draiange and use of compression to manage neck lymphedema     Baseline  Pt is using her Jovi pak to manage swelling, she does not insurance coverage for Flexitouch     Status  Achieved      PT SHORT TERM GOAL #2   Title  Pt will be independent in a home exercise program for strength of UE and LE  and neck range of motion     Status  Achieved       PT SHORT TERM GOAL #3   Title  Pt will increase active range of motion of right atm to 130 indicating increase in UE strength so that she can perfom household chores easier     Status  Achieved      PT SHORT TERM GOAL #4   Title  Pt will report a decrease in overall pain by 25%    Baseline  pain is increased with radiation treatment     Status  Achieved      PT SHORT TERM GOAL #5   Title  Pt will increase right grip strength by an average of 10#     Status  Achieved        PT Long Term Goals - 11/06/17 1614      PT LONG TERM GOAL #1   Title  Pt will decrease quick DASH score to less than 10 indicating a functional improvment in left arm use     Baseline  63.64 on eval,  31.82 on 06/27/2017, 31.82 on 11/06/2017    Status  Partially Met      PT LONG TERM GOAL #2   Title  Pt will increase 30 second sit to stand by 2 repetitions showing improvment in functional strength    Status  Achieved      PT LONG TERM GOAL #3   Title  pt will decrease TUG score by 2 seconds indicating an improvement in functional gait and balance.     Baseline  6.91 sec on eval 5.79 sec on 06/27/2017 , 4.26 on 08/27/2017    Status  Achieved      PT LONG TERM GOAL #4   Title  Pt will report a decrease in pain by 50%     Baseline  revised still is having problems with left shoulder pain ; 98% improvement with this of Lt shoulder -09/10/17    Status  Achieved      PT LONG TERM GOAL #5   Title  Pt will increase the strength of right shoudler flexion and abduction to 4/5     Baseline  Rt shoulder flexion 4+/5, abduction 4/5, er 5/5 and IR 4/5-4/23/19    Status  Achieved      PT LONG TERM GOAL #6   Title  Pt will report tightness and tenderness in trigger points in neck and upper traps have decreased by 50%     Baseline  Pt reports this close to 100% as long as she continues to watch-09/10/17      PT LONG TERM GOAL #7   Title  Pt will have 3/5 strength of scapular muscles when tested by "full can"  horizontal  abduction in prone     Baseline  3-/5 scapular muscle strength measured in prone     Status  Partially Met            Plan - 11/06/17 1334    Clinical Impression Statement  Pt continues to have pain in left upper arm with intermittent movements and has not had a decrease in her Quick DASH score due to it.  She has tightness in her upper traps and with get manage that with massage in the community.  She is independent in manageing her lymphedema and a home exercise program. as she still has some decreased right scapular stablization.  She is ready to discharge from PT and continue with community  exercise.     Clinical Impairments Affecting Rehab Potential  extensive surgery ; completed radiation 06/18/17    PT Treatment/Interventions  ADLs/Self Care Home Management;Therapeutic activities;Therapeutic exercise;Taping;Manual techniques;Manual lymph drainage;Compression bandaging;Passive range of motion;Scar mobilization;Patient/family education;Functional mobility training;DME Instruction;Balance training;Neuromuscular re-education;Gait training;Stair training;Dry needling    PT Next Visit Plan  Discharge from PT        Patient will benefit from skilled therapeutic intervention in order to improve the following deficits and impairments:  Abnormal gait, Decreased endurance, Decreased skin integrity, Impaired sensation, Increased edema, Decreased scar mobility, Decreased knowledge of precautions, Decreased activity tolerance, Decreased knowledge of use of DME, Decreased strength, Increased fascial restricitons, Impaired UE functional use, Pain, Difficulty walking, Decreased mobility, Decreased balance, Decreased range of motion, Impaired perceived functional ability, Postural dysfunction, Increased muscle spasms, Improper body mechanics  Visit Diagnosis: Aftercare following surgery for neoplasm  Acute pain of right shoulder  Muscle weakness (generalized)  Pain in left upper arm  Lymphedema,  not elsewhere classified     Problem List Patient Active Problem List   Diagnosis Date Noted  . 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 Squamous cell carcinoma of tongue ([place]) 04/23/2017   PHYSICAL THERAPY DISCHARGE SUMMARY  Visits from Start of Care: 45  Current functional level related to goals / functional outcomes: Independent, as above   Remaining deficits: Pain in left upper arm, scapular dyskinesia    Education / Equipment: Lymphedema risk reduction, self manual lymph drainage, use of compression, home exercise  Plan: Patient agrees to discharge.  Patient goals were partially met. Patient is being discharged due to being pleased with the current functional level.  ?????    Donato Heinz. Owens Shark PT  Norwood Levo 11/06/2017, 4:21 PM  [place], [place], [address] Phone: [phone]   Fax:  [phone]  Name: Nancy [name] MRN: [id number] Date of Birth: Nov 02, 1960

## 2017-11-11 ENCOUNTER — Ambulatory Visit: Payer: Medicare Other

## 2017-11-29 ENCOUNTER — Ambulatory Visit: Payer: Medicare Other | Admitting: Psychology

## 2017-11-29 DIAGNOSIS — F3341 Major depressive disorder, recurrent, in partial remission: Secondary | ICD-10-CM | POA: Diagnosis not present

## 2017-12-02 ENCOUNTER — Ambulatory Visit: Payer: Medicare Other

## 2018-01-27 ENCOUNTER — Ambulatory Visit: Payer: Medicare Other | Admitting: Psychology

## 2018-01-27 DIAGNOSIS — F3341 Major depressive disorder, recurrent, in partial remission: Secondary | ICD-10-CM | POA: Diagnosis not present

## 2018-02-25 ENCOUNTER — Ambulatory Visit (INDEPENDENT_AMBULATORY_CARE_PROVIDER_SITE_OTHER): Payer: Medicare Other | Admitting: Psychology

## 2018-02-25 DIAGNOSIS — F3341 Major depressive disorder, recurrent, in partial remission: Secondary | ICD-10-CM

## 2018-03-05 ENCOUNTER — Other Ambulatory Visit: Payer: Self-pay

## 2018-03-05 ENCOUNTER — Encounter (HOSPITAL_COMMUNITY): Payer: Self-pay | Admitting: Emergency Medicine

## 2018-03-05 ENCOUNTER — Emergency Department (HOSPITAL_COMMUNITY): Payer: Medicare Other

## 2018-03-05 ENCOUNTER — Emergency Department (HOSPITAL_COMMUNITY)
Admission: EM | Admit: 2018-03-05 | Discharge: 2018-03-05 | Disposition: A | Discharge status: Home / Self Care | Payer: Medicare Other | Attending: Emergency Medicine | Admitting: Emergency Medicine

## 2018-03-05 DIAGNOSIS — R220 Localized swelling, mass and lump, head: Secondary | ICD-10-CM | POA: Diagnosis present

## 2018-03-05 DIAGNOSIS — R6884 Jaw pain: Secondary | ICD-10-CM | POA: Insufficient documentation

## 2018-03-05 LAB — CBC WITH DIFFERENTIAL/PLATELET
Abs Immature Granulocytes: 0.01 10*3/uL (ref 0.00–0.07)
Basophils Absolute: 0 10*3/uL (ref 0.0–0.1)
Basophils Relative: 1 %
Eosinophils Absolute: 0.1 10*3/uL (ref 0.0–0.5)
Eosinophils Relative: 1 %
HCT: 41.1 % (ref 36.0–46.0)
Hemoglobin: 14.3 g/dL (ref 12.0–15.0)
Immature Granulocytes: 0 %
Lymphocytes Relative: 26 %
Lymphs Abs: 1.4 10*3/uL (ref 0.7–4.0)
MCH: 32.4 pg (ref 26.0–34.0)
MCHC: 34.8 g/dL (ref 30.0–36.0)
MCV: 93 fL (ref 80.0–100.0)
Monocytes Absolute: 0.4 10*3/uL (ref 0.1–1.0)
Monocytes Relative: 8 %
Neutro Abs: 3.4 10*3/uL (ref 1.7–7.7)
Neutrophils Relative %: 64 %
Platelets: 209 10*3/uL (ref 150–400)
RBC: 4.42 MIL/uL (ref 3.87–5.11)
RDW: 12.8 % (ref 11.5–15.5)
WBC: 5.3 10*3/uL (ref 4.0–10.5)
nRBC: 0 % (ref 0.0–0.2)

## 2018-03-05 LAB — BASIC METABOLIC PANEL
Anion gap: 8 (ref 5–15)
BUN: 9 mg/dL (ref 6–20)
CO2: 26 mmol/L (ref 22–32)
Calcium: 9.7 mg/dL (ref 8.9–10.3)
Chloride: 108 mmol/L (ref 98–111)
Creatinine, Ser: 0.64 mg/dL (ref 0.44–1.00)
GFR calc Af Amer: >60 mL/min (ref >60)
GFR calc non Af Amer: >60 mL/min (ref >60)
Glucose, Bld: 126 mg/dL — ABNORMAL HIGH (ref 70–99)
Potassium: 3.6 mmol/L (ref 3.5–5.1)
Sodium: 142 mmol/L (ref 135–145)

## 2018-03-05 MED ORDER — KETOROLAC TROMETHAMINE 30 MG/ML IJ SOLN
30.0000 mg | Freq: Once | INTRAMUSCULAR | Status: AC
  Administered 2018-03-05: 30 mg via INTRAVENOUS
  Filled 2018-03-05: qty 1

## 2018-03-05 MED ORDER — ACETAMINOPHEN 160 MG/5ML PO SOLN
650.0000 mg | Freq: Once | ORAL | Status: AC
  Administered 2018-03-05: 650 mg via ORAL
  Filled 2018-03-05: qty 20.3

## 2018-03-05 NOTE — Discharge Instructions (Signed)
He can take 500 to 1000 mg of Tylenol every 6 hours as needed for pain.  You can also take ibuprofen occasionally but take this with an antacid and food to avoid upset stomach issues.  Apply ice 2-3 times daily for 20 minutes at a time for comfort.  Do your job range of motion exercises.  Follow-up with your ENT, dentist, or oncologist for reevaluation.  Return to the emergency department immediately for any concerning signs or symptoms develop such as worsening swelling, difficulty breathing or swallowing, drooling, throat tightness, or voice changes.

## 2018-03-05 NOTE — ED Notes (Signed)
RN made aware of pts elevated BP 

## 2018-03-05 NOTE — ED Provider Notes (Signed)
[facility] DEPT Provider Note   CSN: [id number] Arrival date & time: 03/05/18  1259     History   Chief Complaint Chief Complaint  Patient presents with  . Facial Swelling  . Cancer    HPI Nancy [name] is a 57 y.o. female with history of squamous cell carcinoma of the tongue and mouth, hypertension, hypothyroid, bipolar disorder, GERD presents for evaluation of acute onset, progressively worsening swelling of the right side of the jaw beginning just prior to arrival.  She has undergone surgery with reconstruction of the right side of the jaw as well as radiation therapy.  She is currently in remission but follows up with her oncologist, radiation oncologist, and dentist every 3 months.  Approximately an hour prior to my assessment while she was eating chicken noodle soup she began to feel pressure-like pain to the right jaw which radiates up to the right side of the face.  Pain is constant, worsens with palpation and opening the jaw.  She denies any recent fevers or illnesses.  She did receive her flu shot 2 days ago and has had 2 episodes of nonbloody nonbilious emesis since then.  Currently denies any chest pain, shortness of breath, drooling, throat tightness, odynophagia, abdominal pain, nausea, or rash.  No new medications.  Has not tried anything for her symptoms.  Did not take her blood pressure medicines this morning.  The history is provided by the patient.    Past Medical History:  Diagnosis Date  . Bipolar 1 disorder ([place])   . Cervical cancer ([place])   . GERD (gastroesophageal reflux disease)   . History of tracheostomy 03/04/2017   She had trach placed during surgery and reversed before discharge [facility])  . Hypertension   . Hypothyroid   . Manic depressive disorder ([place])   . Tongue cancer [facility])     Patient Active Problem List   Diagnosis Date Noted  . 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 Squamous cell  carcinoma of floor of mouth ([place]) 04/23/2017  . Squamous cell carcinoma of tongue ([place]) 04/23/2017    Past Surgical History:  Procedure Laterality Date  . bone-skin graft     free osteocutaneous flap with microvascular anastomosis: other than iliac crest, metatarsal or great toe.   . cervical lymphadenectomy  03/04/2017   (modified radical neck dissection) [facility]   . EXCISION OF TONGUE LESION Right 03/04/2017   PR excis tongue, mouth, jaw. [facility])  . exploration, carotid  Left 03/04/2017   [facility].   . GLOSSECTOMY  03/04/2017   Glossectomy; composite WO Radical neck dissect [facility])  . LARYNGOSCOPY  11/22/2016   Laryngoscopy, direct, operative with biopsy, [place]  03/04/2017   Reconstruct mandible/ maxil subperiosteal implant, [facility]  . PARTIAL GLOSSECTOMY    . SKIN FULL THICKNESS GRAFT  03/04/2017   [facility]    . VAGINAL HYSTERECTOMY       OB History   None      Home Medications    Prior to Admission medications   Medication Sig Start Date End Date Taking? Authorizing Provider  chlorthalidone (HYGROTON) 25 MG tablet Take 25 mg by mouth daily.  02/19/17  Yes [provider]  DEXILANT 60 MG capsule Take 60 mg by mouth daily.  04/16/17  Yes [provider]  ibuprofen (ADVIL,MOTRIN) 200 MG tablet Take 400 mg by mouth every 6 (six) hours  as needed for mild pain.    Yes [provider]  lamoTRIgine (LAMICTAL) 150 MG tablet Take 300 mg by mouth daily.   Yes [provider]  levothyroxine (SYNTHROID, LEVOTHROID) 25 MCG tablet Take 25 mcg by mouth daily before breakfast.  02/19/17  Yes [provider]  losartan (COZAAR) 100 MG tablet Take 100 mg by mouth daily.  04/16/17  Yes [provider]  PARoxetine (PAXIL) 40 MG tablet Take 40 mg by mouth daily.   Yes [provider]  sodium fluoride (FLUORISHIELD) 1.1 % GEL dental gel Instill 1  drop of gel per tooth space of fluoride tray.  Place over teeth for 5 minutes.  Remove.  Spit out excess.  Repeat nightly. 04/25/17  Yes Lenn Cal, DDS  lidocaine (XYLOCAINE) 2 % solution Patient: Mix 1part 2% viscous lidocaine, 1part H20. Swish & swallow 41mL of diluted mixture, 44min before meals and at bedtime, up to QID Patient not taking: Reported on 10/25/2017 06/24/17   Eppie Gibson, MD  magic mouthwash w/lidocaine SOLN Take 5 mLs by mouth 4 (four) times daily as needed for mouth pain. Patient not taking: Reported on 07/09/2017 06/04/17   Duffy Bruce, MD  ondansetron (ZOFRAN) 8 MG tablet Take 1 tablet (8 mg total) by mouth every 8 (eight) hours as needed for nausea or vomiting. Patient not taking: Reported on 07/09/2017 06/17/17   Eppie Gibson, MD  sucralfate (CARAFATE) 1 g tablet Dissolve 1 tablet in 10 mL H20 and swallow 30 min prior to meals and bedtime. Patient not taking: Reported on 07/09/2017 06/03/17   Eppie Gibson, MD    Family History No family history on file.  Social History Social History   Tobacco Use  . Smoking status: Never Smoker  . Smokeless tobacco: Never Used  Substance Use Topics  . Alcohol use: No  . Drug use: No     Allergies   Amoxicillin and Iodinated diagnostic agents   Review of Systems Review of Systems  Constitutional: Negative for fever.  HENT: Positive for facial swelling. Negative for trouble swallowing.   Respiratory: Negative for shortness of breath.   Cardiovascular: Negative for chest pain.  Gastrointestinal: Positive for nausea (resolved) and vomiting (resolved). Negative for abdominal pain.  Musculoskeletal: Negative for neck stiffness.  All other systems reviewed and are negative.    Physical Exam Updated Vital Signs BP (!) 157/93   Pulse 63   Temp 98.4 F (36.9 C) (Oral)   Resp 16   Ht 5\' 7"  (1.702 m)   Wt 81.6 kg   SpO2 96%   BMI 28.19 kg/m   Physical Exam  Constitutional: She appears well-developed and  well-nourished. No distress.  HENT:  Head: Normocephalic and atraumatic.    Mouth/Throat:    Mild swelling of the right jaw, maximally tender at the angle of the ramus.  No erythema or induration noted.  No trismus, mouth open to at least 3 finger widths.  Postsurgical changes noted.  Patient partially edentulous along the right mandible.  No subglossal swelling.  No submandibular or submental tenderness noted.  Tolerating secretions without difficulty.  No abnormal phonation.  No upper airway stridor.  Eyes: Conjunctivae are normal. Right eye exhibits no discharge. Left eye exhibits no discharge.  Neck: Normal range of motion and full passive range of motion without pain. Neck supple. No JVD present. No tracheal deviation present.  Cardiovascular: Normal rate, regular rhythm and normal heart sounds.  Pulmonary/Chest: Effort normal and breath sounds normal. She has  no wheezes. She exhibits no tenderness.  Abdominal: Soft. Bowel sounds are normal. She exhibits no distension. There is no tenderness.  Musculoskeletal: She exhibits no edema.  Neurological: She is alert.  Skin: Skin is warm and dry. No rash noted. No erythema.  Psychiatric: She has a normal mood and affect. Her behavior is normal.  Nursing note and vitals reviewed.    ED Treatments / Results  Labs (all labs ordered are listed, but only abnormal results are displayed) Labs Reviewed  BASIC METABOLIC PANEL - Abnormal; Notable for the following components:      Result Value   Glucose, Bld 126 (*)    All other components within normal limits  CBC WITH DIFFERENTIAL/PLATELET    EKG None  Radiology Ct Soft Tissue Neck Wo Contrast  Result Date: 03/05/2018 CLINICAL DATA:  Right-sided facial swelling. History of multiple facial and neck surgeries. Squamous cell carcinoma of lateral tongue (HCC)Staging form: Oral Cavity, AJCC 8th Edition- Clinical: Stage IVA (cT4a, cN0, cM0) EXAM: CT NECK WITHOUT CONTRAST TECHNIQUE:  Multidetector CT imaging of the neck was performed following the standard protocol without intravenous contrast. COMPARISON:  CT neck 11/16/2016 FINDINGS: PHARYNX AND LARYNX: --Nasopharynx: Fossae of Rosenmuller are clear. Normal adenoid tonsils for age. --Oral cavity and oropharynx: There are postsurgical changes at the right floor of mouth underlying a mandibular osteotomy. There are multiple surgical clips. No discrete mass is visible on this noncontrast study. --Hypopharynx: Normal vallecula and pyriform sinuses. --Larynx: Normal epiglottis and pre-epiglottic space. Normal aryepiglottic and vocal folds. --Retropharyngeal space: No abscess, effusion or lymphadenopathy. SALIVARY GLANDS: --Parotid: No mass lesion or inflammation. No sialolithiasis or ductal dilatation. --Submandibular: The right submandibular gland has been resected. Normal left. --Sublingual: The right sublingual gland has been resected. Normal left. THYROID: Normal. LYMPH NODES: Status post right-sided neck dissection with multiple clips along the right carotid space. No right-sided lymphadenopathy or mass. No enlarged or abnormal density left-sided lymph nodes. VASCULAR: Limited assessment without contrast but grossly normal. LIMITED INTRACRANIAL: Normal. VISUALIZED ORBITS: Normal. MASTOIDS AND VISUALIZED PARANASAL SINUSES: No fluid levels or advanced mucosal thickening. No mastoid effusion. SKELETON: Postsurgical changes of partial resection at the right mandibular angle with multiple metallic plates. No focal osseous lesion. UPPER CHEST: Clear. OTHER: None. IMPRESSION: 1. In the postsurgical neck, assessment for residual or recurrent tumor is markedly limited in the absence of intravenous contrast material. 2. Within the above limitation, there is no obvious mass. There is nonspecific soft tissue thickening along the superior aspect of the right neck dissection, deep to the parotid gland. If recurrence is suspected, this is probably the most  likely location and a contrast-enhanced examination might be helpful. 3. Status post right neck dissection with resection of the submandibular and sublingual glands. No lymphadenopathy. Electronically Signed   By: Ulyses Jarred M.D.   On: 03/05/2018 14:33    Procedures Procedures (including critical care time)  Medications Ordered in ED Medications  acetaminophen (TYLENOL) solution 650 mg (650 mg Oral Given 03/05/18 1411)  ketorolac (TORADOL) 30 MG/ML injection 30 mg (30 mg Intravenous Given 03/05/18 1446)     Initial Impression / Assessment and Plan / ED Course  I have reviewed the triage vital signs and the nursing notes.  Pertinent labs & imaging results that were available during my care of the patient were reviewed by me and considered in my medical decision making (see chart for details).     Patient with history of throat cancer status post reconstruction presents for evaluation of acute  onset of jaw pain and facial swelling.  She is afebrile, hypertensive but did not have her hypertension medicines this morning.  She is tolerating secretions without difficulty.  No evidence of angioedema or anaphylactic reaction.  She is tolerating secretions without difficulty.  She denies any shortness of breath or pain with swallowing.  She is focally tender at the angle of the ramus with no underlying crepitus or deformity.  No signs of secondary skin infection.  Lab work reviewed by me is overall unremarkable and reassuring with no leukocytosis, anemia, metabolic derangements, or abnormal creatinine.  CT soft tissue neck without contrast was obtained due to patient's anaphylactic reaction to iodine which showed nonspecific soft tissue thickening along the superior aspect of the right neck dissection deep to the parotid gland.  No evidence of fracture, salivary gland stones, or recurrent mass.  No meningeal signs on examination. Doubt vertebral artery dissection. She was given Toradol and Tylenol and  on reevaluation states that her pain is improved as has her swelling.  She is able to drink water in the ED without difficulty and feels comfortable with discharge home.  She has regular follow-up with her ENT, dentist, radiation oncologist, and oncologist.  She will follow-up with her ENT this week for reevaluation.  Discussed strict ED return precautions. Pt verbalized understanding of and agreement with plan and is safe for discharge home at this time.  Patient was seen and evaluated by Dr. Venora Maples who agrees with assessment and plan at this time.  Final Clinical Impressions(s) / ED Diagnoses   Final diagnoses:  Jaw pain  Facial swelling    ED Discharge Orders    None       Debroah Baller 03/05/18 1814    Jola Schmidt, MD 03/07/18 1642

## 2018-03-05 NOTE — ED Triage Notes (Addendum)
Pt presents with right facial swelling onset 30 minutes ago; concern related to hx of cancer at site of swelling; recently remission for same. Pt tearful with triage. Pt verbalizes only new/different "had flu shot 2 days ago."

## 2018-03-05 NOTE — ED Notes (Addendum)
Pts BP is high due to being anxious

## 2018-03-10 ENCOUNTER — Ambulatory Visit (INDEPENDENT_AMBULATORY_CARE_PROVIDER_SITE_OTHER): Payer: Medicare Other | Admitting: Psychology

## 2018-03-10 DIAGNOSIS — F3341 Major depressive disorder, recurrent, in partial remission: Secondary | ICD-10-CM | POA: Diagnosis not present

## 2018-03-27 ENCOUNTER — Ambulatory Visit (INDEPENDENT_AMBULATORY_CARE_PROVIDER_SITE_OTHER): Payer: Medicare Other | Admitting: Psychology

## 2018-03-27 DIAGNOSIS — F3341 Major depressive disorder, recurrent, in partial remission: Secondary | ICD-10-CM | POA: Diagnosis not present

## 2018-04-02 NOTE — Progress Notes (Signed)
Nancy [name] presents for follow up of radiation completed 06/18/17 to her head and neck/ tongue.   Pain issues, if any: She denies Using a feeding tube?: No Weight changes, if any: Wt Readings from Last 3 Encounters:  04/11/18 175 lb 6.4 oz (79.6 kg)  03/05/18 180 lb (81.6 kg)  10/25/17 175 lb 9.6 oz (79.7 kg)    Swallowing issues, if any: She denies.  Smoking or chewing tobacco? She denies.  Using fluoride trays daily? She uses them occasionally.  Last ENT visit was on:  Other notable issues, if any: She is seeing a dentist Dr. Hardin Negus every 3 month.  She has some lymphedema to her radiation area. She has some difficulty swallowing about a month ago due to the lymphedema. She is doing swallowing exercises.  Dr. Jamey Reas 01/09/18 next on 07/17/18.   BP (!) 142/97 (BP Location: Left Arm)   Pulse 86   Temp 98.7 F (37.1 C) (Oral)   Ht 5\' 7"  (1.702 m)   Wt 175 lb 6.4 oz (79.6 kg)   SpO2 99%   BMI 27.47 kg/m

## 2018-04-11 ENCOUNTER — Encounter: Payer: Self-pay | Admitting: Radiation Oncology

## 2018-04-11 ENCOUNTER — Telehealth: Payer: Self-pay | Admitting: *Deleted

## 2018-04-11 ENCOUNTER — Ambulatory Visit
Admission: RE | Admit: 2018-04-11 | Discharge: 2018-04-11 | Disposition: A | Discharge status: Home / Self Care | Payer: Medicare Other | Source: Ambulatory Visit | Attending: Radiation Oncology | Admitting: Radiation Oncology

## 2018-04-11 ENCOUNTER — Other Ambulatory Visit: Payer: Self-pay

## 2018-04-11 VITALS — BP 142/97 | HR 86 | Temp 98.7°F | Ht 67.0 in | Wt 175.4 lb

## 2018-04-11 DIAGNOSIS — C029 Malignant neoplasm of tongue, unspecified: Secondary | ICD-10-CM | POA: Insufficient documentation

## 2018-04-11 DIAGNOSIS — Z79899 Other long term (current) drug therapy: Secondary | ICD-10-CM | POA: Diagnosis not present

## 2018-04-11 DIAGNOSIS — C021 Malignant neoplasm of border of tongue: Secondary | ICD-10-CM | POA: Insufficient documentation

## 2018-04-11 NOTE — Progress Notes (Signed)
Radiation Oncology         [phone] ________________________________  Name: Nancy [name] MRN: [id number]  Date: 04/11/2018  DOB: 10-28-1960  Follow-Up Visit Note  CC: Patient, No Pcp Per  Colin Benton, MD  Diagnosis and Prior Radiotherapy:       ICD-10-CM   1. 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 Squamous cell carcinoma of tongue (HCC) C02.9     Squamous cell carcinoma of lateral tongue ([place]),  Staging form: Oral Cavity   Clinical Stage: Stage IVA (cT4a, cN0, cM0) [id number] 05/06/17 - 06/18/17 60 Gy directed to the HN tongue delivered in 30 fractions.  CHIEF COMPLAINT:  Here for follow-up and surveillance of head and neck cancer  Narrative:  The patient returns today for routine follow-up to radiation therapy completed 06/18/17. Since our last appointment the patient underwent a CT Neck on 03/05/2018 for neck swelling (lymphedema). This scan was markedly limited in the absence of IV contrast. Within that limitation, there is no obvious mass. There is nonspecific soft tissue thickening along the superior aspect of the right neck dissection, deep to the parotid gland.Marland KitchenMarland KitchenNo lymphadenopathy.   Nancy [name] is scheduled to follow-up with Nancy [name] oncologist Dr.  Callas in February 2020.  On review of systems, the patient reports that Nancy [name] is doing well overall. Nancy [name] denies any pain issues. Nancy [name] is using fluoride trays occasionally. Nancy [name] is seeing Nancy [name] dentist, Dr. Hardin Negus every three months. Nancy [name] has some lymphedema to Nancy [name] radiation area. Nancy [name] had some difficulty swallowing about a month ago due to lymphedema. Nancy [name] is doing swallowing exercises. Nancy [name] denies any swallowing issues at this time.    ALLERGIES:  is allergic to amoxicillin and iodinated diagnostic agents.  Meds: Current Outpatient Medications  Medication Sig Dispense Refill  . chlorthalidone (HYGROTON) 25 MG tablet Take 25 mg by mouth daily.     Marland Kitchen DEXILANT 60 MG capsule Take 60 mg by mouth daily.     Marland Kitchen lamoTRIgine  (LAMICTAL) 150 MG tablet Take 300 mg by mouth daily.    Marland Kitchen levothyroxine (SYNTHROID, LEVOTHROID) 25 MCG tablet Take 25 mcg by mouth daily before breakfast.     . losartan (COZAAR) 100 MG tablet Take 100 mg by mouth daily.     Marland Kitchen PARoxetine (PAXIL) 40 MG tablet Take 40 mg by mouth daily.    . sodium fluoride (FLUORISHIELD) 1.1 % GEL dental gel Instill 1 drop of gel per tooth space of fluoride tray.  Place over teeth for 5 minutes.  Remove.  Spit out excess.  Repeat nightly. 120 mL prn  . ibuprofen (ADVIL,MOTRIN) 200 MG tablet Take 400 mg by mouth every 6 (six) hours as needed for mild pain.     Marland Kitchen lidocaine (XYLOCAINE) 2 % solution Patient: Mix 1part 2% viscous lidocaine, 1part H20. Swish & swallow 41mL of diluted mixture, 76min before meals and at bedtime, up to QID (Patient not taking: Reported on 10/25/2017) 300 mL 2  . ondansetron (ZOFRAN) 8 MG tablet Take 1 tablet (8 mg total) by mouth every 8 (eight) hours as needed for nausea or vomiting. (Patient not taking: Reported on 07/09/2017) 30 tablet 2   No current facility-administered medications for this encounter.     Physical Findings: The patient is in no acute distress. Patient is alert and oriented. Wt Readings from Last 3 Encounters:  04/11/18 175 lb 6.4 oz (79.6 kg)  03/05/18 180 lb (81.6 kg)  10/25/17 175 lb 9.6 oz (79.7 kg)    height is 5'  7" (1.702 m) and weight is 175 lb 6.4 oz (79.6 kg). Nancy [name] oral temperature is 98.7 F (37.1 C). Nancy [name] blood pressure is 142/97 (abnormal) and Nancy [name] pulse is 86. Nancy [name] oxygen saturation is 99%. .  General: Alert and oriented, in no acute distress. Nancy [name] is accompanied by family today. HEENT: Head is normocephalic. Extraocular movements are intact. Mucous membranes are somewhat dry. No thrush, and no visible lesions, in the oral cavity or oropharynx. No palpable or visible lesions in the mouth. Neck: Neck is notable for  mild lymphedema, no palpable masses and skin has healed beautifully. Modest lymphedema along the  jaw bilaterally. Skin: Skin in treatment fields shows satisfactory healing. Lymphatics: see Neck Exam Psychiatric: Judgment and insight are intact. Affect is appropriate.   Lab Findings: Lab Results  Component Value Date   WBC 5.3 03/05/2018   HGB 14.3 03/05/2018   HCT 41.1 03/05/2018   MCV 93.0 03/05/2018   PLT 209 03/05/2018    No results found for: TSH  Radiographic Findings: No results found.  Impression/Plan:    1) Head and Neck Cancer Status: Patient is recovering very well from radiation treatment. NED.  2) Nutritional Status: Overall Nancy [name] is able to eat well. PEG tube: No   3) Risk Factors: The patient has been educated about risk factors including alcohol and tobacco abuse; they understand that avoidance of alcohol and tobacco is important to prevent recurrences as well as other cancers  4) Swallowing: No issues at this time.  5) Dental: Encouraged to continue regular followup with dentistry, and dental hygiene including fluoride rinses.   6) Thyroid function: No results found for: TSH May check annually w/ PCP.  Unlikely to be affected by RT which was unilateral and not comprehensive to whole neck  7) Follow-up in August 2020. Nancy [name] will see Nancy [name] oncologist Dr. Gilt Edge Callas in February and May 2020 with imaging results review. The patient was encouraged to call with any issues or questions before then.    I spent 25 minutes face to face with the patient and more than 50% of that time was spent in counseling and/or coordination of care.    _____________________________________   Eppie Gibson, MD   This document serves as a record of services personally performed by Eppie Gibson, MD. It was created on Nancy [name] behalf by Arlyce Harman, a trained medical scribe. The creation of this record is based on the scribe's personal observations and the provider's statements to them. This document has been checked and approved by the attending provider.

## 2018-04-11 NOTE — Telephone Encounter (Signed)
CALLED PATIENT TO INFORM OF FU APPT. FOR 01-16-19 WITH DR. Isidore Moos, LVM FOR A RETURN CALL

## 2018-04-12 ENCOUNTER — Encounter: Payer: Self-pay | Admitting: Radiation Oncology

## 2018-04-15 ENCOUNTER — Ambulatory Visit (INDEPENDENT_AMBULATORY_CARE_PROVIDER_SITE_OTHER): Payer: Medicare Other | Admitting: Psychology

## 2018-04-15 DIAGNOSIS — F3341 Major depressive disorder, recurrent, in partial remission: Secondary | ICD-10-CM | POA: Diagnosis not present

## 2018-05-08 ENCOUNTER — Ambulatory Visit: Payer: Self-pay | Admitting: Psychology

## 2018-05-23 ENCOUNTER — Ambulatory Visit (INDEPENDENT_AMBULATORY_CARE_PROVIDER_SITE_OTHER): Payer: Medicare Other | Admitting: Psychology

## 2018-05-23 DIAGNOSIS — F319 Bipolar disorder, unspecified: Secondary | ICD-10-CM | POA: Diagnosis not present

## 2018-06-12 ENCOUNTER — Ambulatory Visit (INDEPENDENT_AMBULATORY_CARE_PROVIDER_SITE_OTHER): Payer: Medicare Other | Admitting: Psychology

## 2018-06-12 DIAGNOSIS — F3341 Major depressive disorder, recurrent, in partial remission: Secondary | ICD-10-CM | POA: Diagnosis not present

## 2018-06-17 ENCOUNTER — Ambulatory Visit: Payer: Medicare Other | Admitting: Psychology

## 2018-07-10 ENCOUNTER — Ambulatory Visit (INDEPENDENT_AMBULATORY_CARE_PROVIDER_SITE_OTHER): Payer: Medicare Other | Admitting: Psychology

## 2018-07-10 DIAGNOSIS — F319 Bipolar disorder, unspecified: Secondary | ICD-10-CM | POA: Diagnosis not present

## 2018-08-04 ENCOUNTER — Ambulatory Visit: Payer: Medicare Other | Admitting: Psychology

## 2018-08-15 NOTE — Therapy (Signed)
[facility] [address] [place], [place], [address] Phone: [phone]   Fax:  [phone]  Patient Details  Name: Nancy [name] MRN: [id number] Date of Birth: [birth year] Referring Provider:  Eppie Gibson, MD  Encounter Date: 08/15/2018  SPEECH THERAPY DISCHARGE SUMMARY  Visits from Start of Care: 4  Current functional level related to goals / functional outcomes: Pt seen for 4 of 6 scheduled therapy sessions. Did not return after visit 4, on 10-07-17. Pt's last therapy note, goals, and plan from that visit are following:  Other treatment/comments  Pt jaw still out of alignment (has appointment tomorrow) and pt has tried dense meats and potato chips. Pt has done HEP once a week due to losing the HEP. SLP provided another for pt today. She req'd mod A occasionally for HEP. SLP reminded pt of necessity of doing HEP as prescribed.       Assessment / Recommendations / Plan   Plan  Continue with current plan of care      Dysphagia Recommendations   Diet recommendations  Dysphagia 3 (mechanical soft);Thin liquid diet as tolerated   SLP Short Term Goals - 06/10/17 1014              SLP SHORT TERM GOAL #1    Title  pt will complete HEP with rare min A     Status  Achieved          SLP SHORT TERM GOAL #2    Title  pt will tell SLP why she is completing HEP      Status  Achieved          SLP SHORT TERM GOAL #3    Title  pt will follow compensations of lt-sided mastication and lt-sided oral transit with POs     Status  Achieved          SLP SHORT TERM GOAL #4    Title  pt will tell SLP 3 overt s/s aspiration PNA with modified independence     Status  Achieved              SLP Long Term Goals - 10/07/17 1226              SLP LONG TERM GOAL #1    Title  pt will complete HEP with modified independence over two visits     Baseline  LTGs renewed 10-07-17;  08-05-17     Time  2     Period  -- visits    Status  On-going and ongoing          SLP LONG TERM GOAL #2    Title  pt will tell how a food journal can expedite return to more normalized diet     Status  Deferred due to pt returning to almost normalized diet, per her report          SLP LONG TERM GOAL #3    Title  pt will tell SLP 3 overt s/s aspiration PNA over 2 visits     Status  Achieved and ongoing              Plan - 10/07/17 1224     Clinical Impression Statement  Pt with oropharyngeal swallowing continues essentially WNL with soft foods as eaten today. Pt cont to report an even wider variety of foods consumed than last session. No overt s/s aspiration today; pt continues to admit to suboptimal compliance with HEP (due to  misplacing HEP). SLP reminded pt of rationale for HEP. See "skiled intervention" for more details. The probability of further swallowing difficulty increases dramatically with the completion of radiation therapy. Pt will need to cont to be followed by SLP for regular assessment of accurate HEP completion (due to requiring mod cues today - she was SBA last session) as well as for safety with POs both during and following treatment/s.    Other treatment/comments  Pt jaw still out of alignment (has appointment tomorrow) and pt has tried dense meats and potato chips. Pt has done HEP once a week due to losing the HEP. SLP provided another for pt today. She req'd mod A occasionally for HEP. SLP reminded pt of necessity of doing HEP as prescribed.       Assessment / Recommendations / Plan   Plan  Continue with current plan of care      Dysphagia Recommendations   Diet recommendations  Dysphagia 3 (mechanical soft);Thin liquid diet as tolerated    ============================   Remaining deficits: Unknown, pt not seen since Mary 2019.   Education / Equipment: HEP, swallow precautions, late effects head/neck radiation on swallowing.  Plan: Patient agrees to discharge.  Patient goals were partially met.  Patient is being discharged due to not returning since the last visit.  ?????      [place] ,MS, CCC-SLP  08/15/2018, 1:05 PM  [facility] [address] [place], [place], [address] Phone: [phone]   Fax:  [phone]

## 2018-12-09 ENCOUNTER — Ambulatory Visit (INDEPENDENT_AMBULATORY_CARE_PROVIDER_SITE_OTHER): Payer: Medicare Other | Admitting: Psychology

## 2018-12-09 DIAGNOSIS — F319 Bipolar disorder, unspecified: Secondary | ICD-10-CM | POA: Diagnosis not present

## 2018-12-19 ENCOUNTER — Telehealth: Payer: Self-pay | Admitting: *Deleted

## 2018-12-19 NOTE — Telephone Encounter (Signed)
CALLED PATIENT TO GIVE October FU, SPOKE WITH PATIENT AND SHE HAS A FU APPT. FOR 03-13-19 @ 10 AM, PATIENT VERIFIED UNDERSTANDING THIS

## 2019-01-03 IMAGING — CT CT OUTSIDE FILMS SOFT TISSUE NECK
2 of 3 series · 9 of 14 positions shown, 11 images · non-contrast
Comparison: none

[Series 2: ax neck std · axial · 0.39mm/px · z∈[-184,-108]mm · 2 of 182 slices shown]
[im 61/182  soft-tissue]
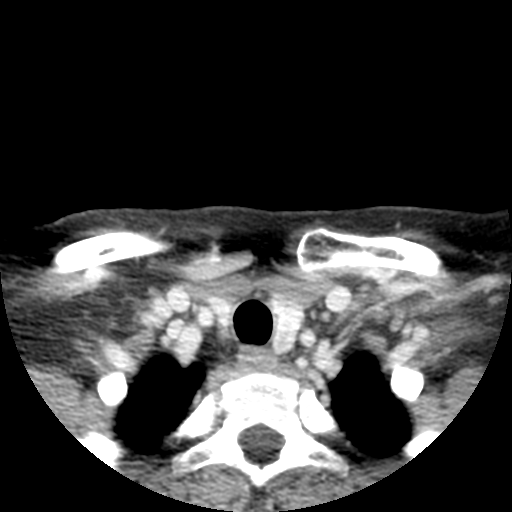
[im 121/182  soft-tissue]
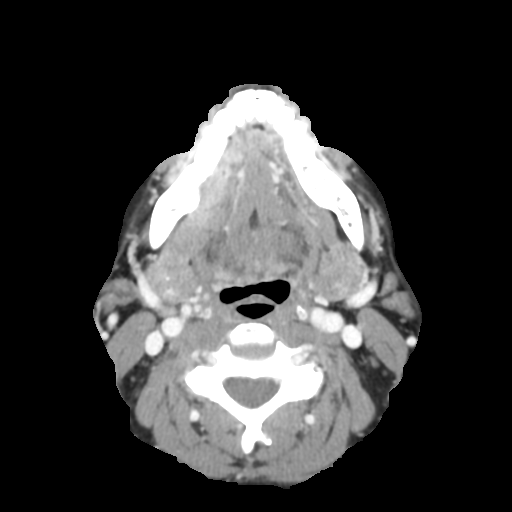

[Series 4: ax neck std thin · axial · 0.39mm/px · z∈[-231,-60]mm · 7 of 366 slices shown, 9 images]
[im 46/366  soft-tissue]
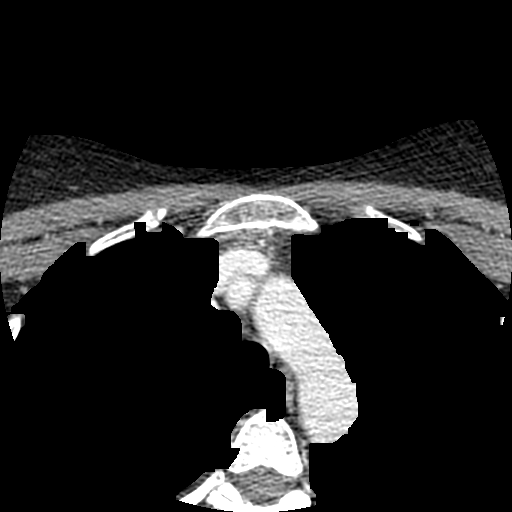
[im 46/366  bone]
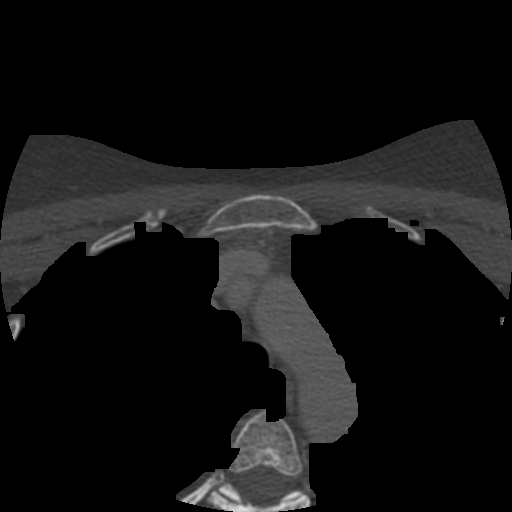
[im 92/366  bone]
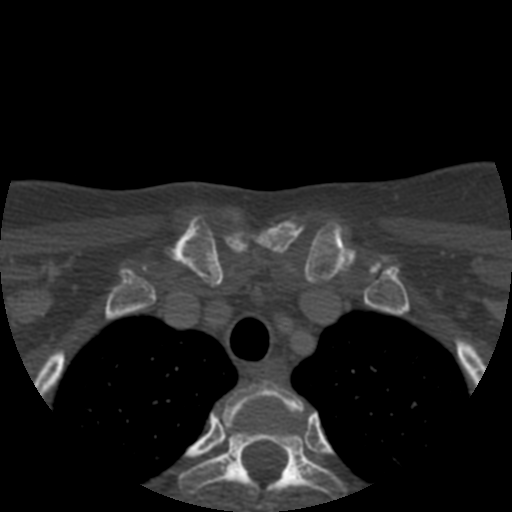
[im 137/366  bone]
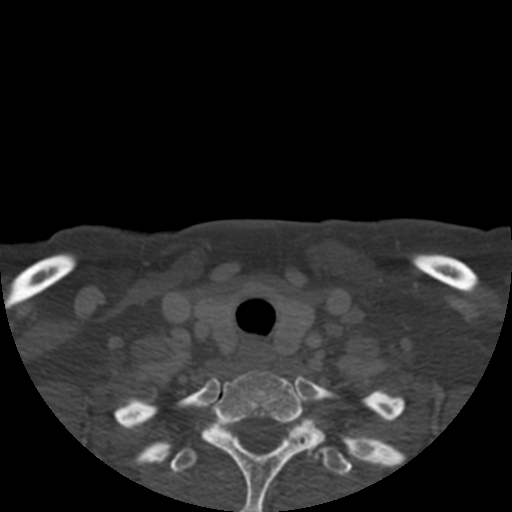
[im 183/366  bone]
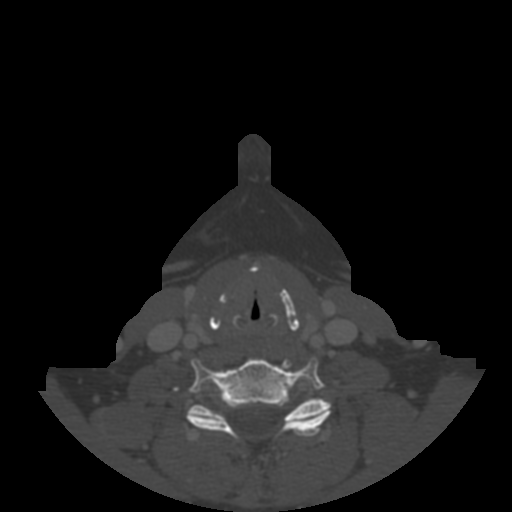
[im 229/366  soft-tissue]
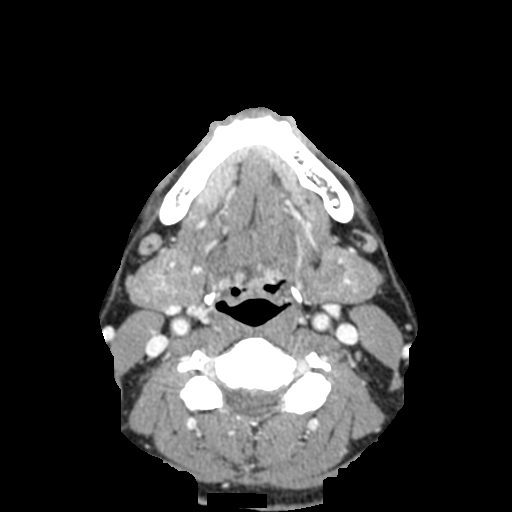
[im 229/366  bone]
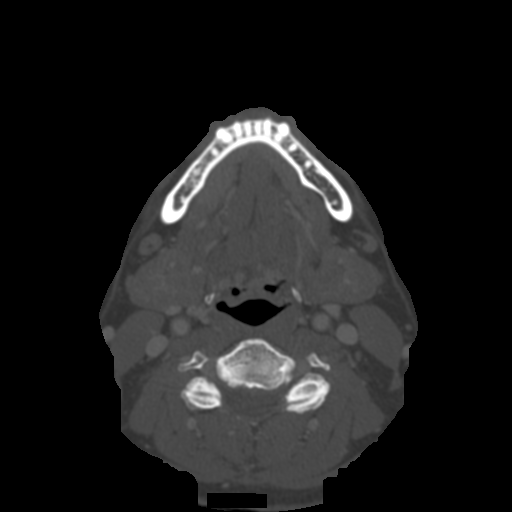
[im 274/366  bone]
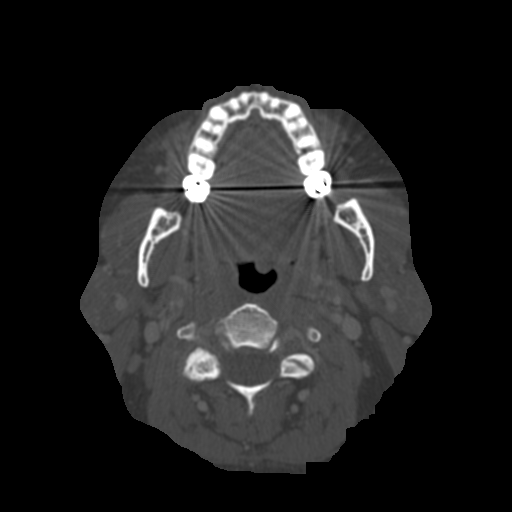
[im 320/366  bone]
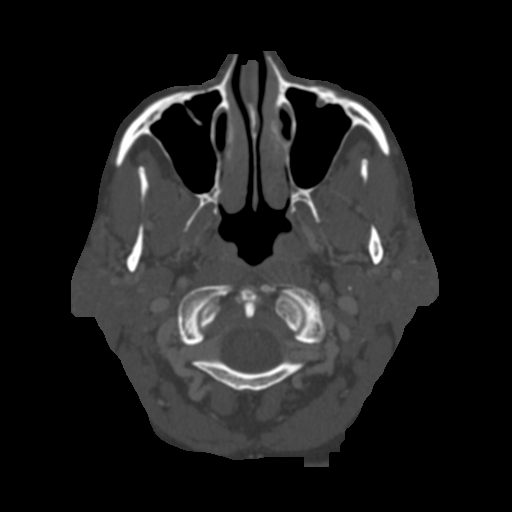

[9 of 14 positions shown; findings below may reference images not displayed]

Canned report from images found in remote index.

Refer to host system for actual result text.

## 2019-01-05 ENCOUNTER — Ambulatory Visit: Payer: Medicare Other | Admitting: Psychology

## 2019-01-06 ENCOUNTER — Ambulatory Visit (INDEPENDENT_AMBULATORY_CARE_PROVIDER_SITE_OTHER): Payer: Medicare Other | Admitting: Psychology

## 2019-01-06 DIAGNOSIS — F319 Bipolar disorder, unspecified: Secondary | ICD-10-CM

## 2019-01-16 ENCOUNTER — Ambulatory Visit: Payer: Self-pay | Admitting: Radiation Oncology

## 2019-02-02 ENCOUNTER — Ambulatory Visit (INDEPENDENT_AMBULATORY_CARE_PROVIDER_SITE_OTHER): Payer: Medicare Other | Admitting: Psychology

## 2019-02-02 DIAGNOSIS — F319 Bipolar disorder, unspecified: Secondary | ICD-10-CM | POA: Diagnosis not present

## 2019-03-05 ENCOUNTER — Ambulatory Visit (INDEPENDENT_AMBULATORY_CARE_PROVIDER_SITE_OTHER): Payer: Medicare Other | Admitting: Psychology

## 2019-03-05 DIAGNOSIS — F319 Bipolar disorder, unspecified: Secondary | ICD-10-CM | POA: Diagnosis not present

## 2019-03-11 NOTE — Progress Notes (Signed)
Nancy [name] presents for follow up of radiation completed 06/18/17 to her head/neck and tongue.   Pain issues, if any: She denies Using a feeding tube?: N/A Weight changes, if any:  Wt Readings from Last 3 Encounters:  03/13/19 175 lb 9.6 oz (79.7 kg)  04/11/18 175 lb 6.4 oz (79.6 kg)  03/05/18 180 lb (81.6 kg)   Swallowing issues, if any: She denies. She is not able to drink/eat citris due to burning in her throat.  Smoking or chewing tobacco? No Using fluoride trays daily? Yes.  Last ENT visit was on: N/A Other notable issues, if any:  Dr. [place] Callas 12/11/18 Assessment:  CT from may showed NED. [place] in clinic today showed nothing of concern. Disease Status: No Evidence of Disease (NED) -- doing very well  Plan:   1. FOLLOW-UP with me in January. Will follow up with local radiation in October 2. Repeat CT Chest in January 3. TOBACCO: Does not use Tobacco   BP (!) 165/99 (BP Location: Left Arm)   Pulse 91   Temp 98.3 F (36.8 C) (Tympanic)   Resp 20   Wt 175 lb 9.6 oz (79.7 kg)   SpO2 99%   BMI 27.50 kg/m

## 2019-03-12 NOTE — Progress Notes (Signed)
Radiation Oncology         [phone] ________________________________  Name: Nancy [name] MRN: [id number]  Date: 03/13/2019  DOB: July 21, 1960  Follow-Up Visit Note  CC: Patient, No Pcp Per  Colin Benton, MD  Diagnosis and Prior Radiotherapy:       ICD-10-CM   1. 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  CHIEF COMPLAINT:  Here for follow-up and surveillance of head and neck cancer  Narrative:  The patient returns today for routine follow-up to radiation therapy completed 06/18/17. She last saw her oncologist, Dr. [place] Callas, on 12/09/2018. NED.  Since her last visit, she underwent chest CT on 12/01/2018, which was stable.  Pain issues, if any: She denies Using a feeding tube?: N/A Weight changes, if any:  Wt Readings from Last 3 Encounters:  03/13/19 175 lb 9.6 oz (79.7 kg)  04/11/18 175 lb 6.4 oz (79.6 kg)  03/05/18 180 lb (81.6 kg)   Swallowing issues, if any: She denies. She is not able to drink/eat citris due to burning in her throat.  Smoking or chewing tobacco? No Using fluoride trays daily? Yes.    ALLERGIES:  is allergic to amoxicillin and iodinated diagnostic agents.  Meds: Current Outpatient Medications  Medication Sig Dispense Refill  . chlorthalidone (HYGROTON) 25 MG tablet Take 25 mg by mouth daily.     Marland Kitchen DEXILANT 60 MG capsule Take 60 mg by mouth daily.     Marland Kitchen ibuprofen (ADVIL,MOTRIN) 200 MG tablet Take 400 mg by mouth every 6 (six) hours as needed for mild pain.     Marland Kitchen lamoTRIgine (LAMICTAL) 150 MG tablet Take 300 mg by mouth daily.    Marland Kitchen levothyroxine (SYNTHROID, LEVOTHROID) 25 MCG tablet Take 25 mcg by mouth daily before breakfast.     . losartan (COZAAR) 100 MG tablet Take 100 mg by mouth daily.     Marland Kitchen PARoxetine (PAXIL) 40 MG  tablet Take 40 mg by mouth daily.    . sodium fluoride (FLUORISHIELD) 1.1 % GEL dental gel Instill 1 drop of gel per tooth space of fluoride tray.  Place over teeth for 5 minutes.  Remove.  Spit out excess.  Repeat nightly. 120 mL prn  . lidocaine (XYLOCAINE) 2 % solution Patient: Mix 1part 2% viscous lidocaine, 1part H20. Swish & swallow 23mL of diluted mixture, 59min before meals and at bedtime, up to QID (Patient not taking: Reported on 10/25/2017) 300 mL 2  . ondansetron (ZOFRAN) 8 MG tablet Take 1 tablet (8 mg total) by mouth every 8 (eight) hours as needed for nausea or vomiting. (Patient not taking: Reported on 07/09/2017) 30 tablet 2   No current facility-administered medications for this encounter.     Physical Findings: The patient is in no acute distress. Patient is alert and oriented. Wt Readings from Last 3 Encounters:  03/13/19 175 lb 9.6 oz (79.7 kg)  04/11/18 175 lb 6.4 oz (79.6 kg)  03/05/18 180 lb (81.6 kg)    weight is 175 lb 9.6 oz (79.7 kg). Her tympanic temperature is 98.3 F (36.8 C). Her blood pressure is 165/99 (abnormal) and her pulse is 91. Her respiration is 20 and oxygen saturation is 99%. .  General: Alert and oriented, in no acute distress.  HEENT: Head is  normocephalic. Extraocular movements are intact.  No thrush, and no visible lesions in the oral cavity or oropharynx.  Neck: Neck is notable for stable lymphedema, no palpable masses and skin is intact Skin: Skin in treatment fields shows satisfactory healing. Lymphatics: see Neck Exam Psychiatric: Judgment and insight are intact. Affect is appropriate.   Lab Findings: Lab Results  Component Value Date   WBC 5.3 03/05/2018   HGB 14.3 03/05/2018   HCT 41.1 03/05/2018   MCV 93.0 03/05/2018   PLT 209 03/05/2018    No results found for: TSH  Radiographic Findings: No results found.  Impression/Plan:    1) Head and Neck Cancer Status: Patient is recovering very well from radiation treatment. NED.   2) Nutritional Status: Overall she is able to eat well. PEG tube: No   3) Risk Factors: The patient has been educated about risk factors including alcohol and tobacco abuse; they understand that avoidance of alcohol and tobacco is important to prevent recurrences as well as other cancers  4) Swallowing: No issues at this time.  5) Dental: Encouraged to continue regular followup with dentistry, and dental hygiene including fluoride rinses.   6) Thyroid function: No results found for: TSH May check annually w/ PCP.  Unlikely to be affected by RT which was unilateral and not comprehensive to whole neck  7) Follow-up in May. The patient was encouraged to call with any issues or questions before then.    I spent 15 minutes face to face with the patient and more than 50% of that time was spent in counseling and/or coordination of care.    _____________________________________   Eppie Gibson, MD   This document serves as a record of services personally performed by Eppie Gibson, MD. It was created on her behalf by Wilburn Mylar, a trained medical scribe. The creation of this record is based on the scribe's personal observations and the provider's statements to them. This document has been checked and approved by the attending provider.

## 2019-03-13 ENCOUNTER — Other Ambulatory Visit: Payer: Self-pay

## 2019-03-13 ENCOUNTER — Encounter: Payer: Self-pay | Admitting: Radiation Oncology

## 2019-03-13 ENCOUNTER — Ambulatory Visit
Admission: RE | Admit: 2019-03-13 | Discharge: 2019-03-13 | Disposition: A | Discharge status: Home / Self Care | Payer: Medicare Other | Source: Ambulatory Visit | Attending: Radiation Oncology | Admitting: Radiation Oncology

## 2019-03-13 VITALS — BP 165/99 | HR 91 | Temp 98.3°F | Resp 20 | Wt 175.6 lb

## 2019-03-13 DIAGNOSIS — Z79899 Other long term (current) drug therapy: Secondary | ICD-10-CM | POA: Insufficient documentation

## 2019-03-13 DIAGNOSIS — Z85810 Personal history of malignant neoplasm of tongue: Secondary | ICD-10-CM | POA: Diagnosis not present

## 2019-03-13 DIAGNOSIS — Z923 Personal history of irradiation: Secondary | ICD-10-CM | POA: Diagnosis not present

## 2019-03-13 DIAGNOSIS — C021 Malignant neoplasm of border of tongue: Secondary | ICD-10-CM

## 2019-03-16 ENCOUNTER — Encounter: Payer: Self-pay | Admitting: Radiation Oncology

## 2019-03-19 ENCOUNTER — Ambulatory Visit (INDEPENDENT_AMBULATORY_CARE_PROVIDER_SITE_OTHER): Payer: Medicare Other | Admitting: Psychology

## 2019-03-19 DIAGNOSIS — F319 Bipolar disorder, unspecified: Secondary | ICD-10-CM

## 2019-04-02 ENCOUNTER — Ambulatory Visit (INDEPENDENT_AMBULATORY_CARE_PROVIDER_SITE_OTHER): Payer: Medicare Other | Admitting: Psychology

## 2019-04-02 DIAGNOSIS — F319 Bipolar disorder, unspecified: Secondary | ICD-10-CM | POA: Diagnosis not present

## 2019-05-05 ENCOUNTER — Ambulatory Visit: Payer: Medicare Other | Admitting: Psychology

## 2019-05-25 ENCOUNTER — Other Ambulatory Visit: Payer: Self-pay

## 2019-05-25 ENCOUNTER — Emergency Department (HOSPITAL_COMMUNITY)
Admission: EM | Admit: 2019-05-25 | Discharge: 2019-05-25 | Disposition: A | Discharge status: Home / Self Care | Payer: Medicare Other | Attending: Emergency Medicine | Admitting: Emergency Medicine

## 2019-05-25 ENCOUNTER — Encounter (HOSPITAL_COMMUNITY): Payer: Self-pay | Admitting: Emergency Medicine

## 2019-05-25 ENCOUNTER — Emergency Department (HOSPITAL_COMMUNITY): Payer: Medicare Other

## 2019-05-25 DIAGNOSIS — Y92008 Other place in unspecified non-institutional (private) residence as the place of occurrence of the external cause: Secondary | ICD-10-CM | POA: Insufficient documentation

## 2019-05-25 DIAGNOSIS — W010XXA Fall on same level from slipping, tripping and stumbling without subsequent striking against object, initial encounter: Secondary | ICD-10-CM | POA: Insufficient documentation

## 2019-05-25 DIAGNOSIS — Y999 Unspecified external cause status: Secondary | ICD-10-CM | POA: Insufficient documentation

## 2019-05-25 DIAGNOSIS — Y939 Activity, unspecified: Secondary | ICD-10-CM | POA: Diagnosis not present

## 2019-05-25 DIAGNOSIS — I10 Essential (primary) hypertension: Secondary | ICD-10-CM | POA: Diagnosis not present

## 2019-05-25 DIAGNOSIS — E039 Hypothyroidism, unspecified: Secondary | ICD-10-CM | POA: Insufficient documentation

## 2019-05-25 DIAGNOSIS — Z79899 Other long term (current) drug therapy: Secondary | ICD-10-CM | POA: Insufficient documentation

## 2019-05-25 DIAGNOSIS — S99922A Unspecified injury of left foot, initial encounter: Secondary | ICD-10-CM | POA: Diagnosis present

## 2019-05-25 MED ORDER — METHOCARBAMOL 500 MG PO TABS: 500.0000 mg | ORAL_TABLET | Freq: Two times a day (BID) | ORAL | 0 refills | Status: AC

## 2019-05-25 MED ORDER — DICLOFENAC SODIUM 1 % EX GEL: 4.0000 g | Freq: Four times a day (QID) | CUTANEOUS | 2 refills | Status: AC

## 2019-05-25 MED ORDER — LIDOCAINE 5 % EX PTCH: 1.0000 | MEDICATED_PATCH | CUTANEOUS | 0 refills | Status: AC

## 2019-05-25 MED ORDER — HYDROCODONE-ACETAMINOPHEN 5-325 MG PO TABS: 1.0000 | ORAL_TABLET | Freq: Four times a day (QID) | ORAL | 0 refills | Status: AC | PRN

## 2019-05-25 MED FILL — HYDROCODON-APAP 5-325: 5-325 | 5 days supply | Qty: 10 | Fill #0

## 2019-05-25 MED FILL — DICLOFENAC SODIUM 1 % GEL: 1 | 6 days supply | Qty: 100 | Fill #0

## 2019-05-25 MED FILL — METHOCARBAMOL 500 MG TABS: 500 | 10 days supply | Qty: 20 | Fill #0

## 2019-05-25 NOTE — Discharge Instructions (Addendum)
You have been seen today for a foot injury. There were no acute abnormalities on the x-rays, including no sign of fracture or dislocation, however, there could be injuries to the soft tissues, such as the ligaments or tendons that are not seen on xrays. There could also be what are called occult fractures that are small fractures not seen on xray. Antiinflammatory medications: Take 600 mg of ibuprofen every 6 hours or 440 mg (over the counter dose) to 500 mg (prescription dose) of naproxen every 12 hours for the next 3 days. After this time, these medications may be used as needed for pain. Take these medications with food to avoid upset stomach. Choose only one of these medications, do not take them together. Acetaminophen (generic for Tylenol): Should you continue to have additional pain while taking the ibuprofen or naproxen, you may add in acetaminophen as needed. Your daily total maximum amount of acetaminophen from all sources should be limited to 4000mg /day for persons without liver problems, or 2000mg /day for those with liver problems. Vicodin: May take Vicodin (hydrocodone-acetaminophen) as needed for severe pain.   Do not drive or perform other dangerous activities while taking this medication as it can cause drowsiness as well as changes in reaction time and judgement.   Please note that each pill of Vicodin contains 325 mg of acetaminophen (generic for Tylenol) and the above dosage limits apply. Methocarbamol: Methocarbamol (generic for Robaxin) is a muscle relaxer and can help relieve stiff muscles or muscle spasms.  Do not drive or perform other dangerous activities while taking this medication as it can cause drowsiness as well as changes in reaction time and judgement. Lidocaine patches: These are available via either prescription or over-the-counter. The over-the-counter option may be more economical one and are likely just as effective. There are multiple over-the-counter brands, such as  Salonpas. Ice: May apply ice to the area over the next 24 hours for 15 minutes at a time to reduce swelling. Elevation: Keep the extremity elevated as often as possible to reduce pain and inflammation. Support: Wear the cam boot for support and comfort. Wear this until pain resolves. You will be weight-bearing as tolerated, which means you can slowly start to put weight on the extremity and increase amount and frequency as pain allows. Exercises: Start by performing these exercises a few times a week, increasing the frequency until you are performing them twice daily.  Follow up: If symptoms are improving, you may follow up with your primary care provider for any continued management. If symptoms are not starting to improve within a week, you should follow up with the orthopedic specialist within two weeks. Return: Return to the ED for numbness, weakness, increasing pain, overall worsening symptoms, loss of function, or if symptoms are not improving, you have tried to follow up with the orthopedic specialist, and have been unable to do so.  For prescription assistance, may try using prescription discount sites or apps, such as goodrx.com

## 2019-05-25 NOTE — ED Triage Notes (Signed)
Pt reports slipped on her icy stairs yesterday and fell. C/o left foot and ankle pains, esp with weight bearing.

## 2019-05-25 NOTE — ED Notes (Signed)
Pt refused discharge vital signs

## 2019-05-25 NOTE — ED Provider Notes (Signed)
[facility] DEPT Provider Note   CSN: [id number] Arrival date & time: 05/25/19  [id number]     History Chief Complaint  Patient presents with  . Foot Injury    Nancy [name] is a 59 y.o. female.  HPI      Nancy [name] is a 59 y.o. female, with a history of bipolar, GERD, HTN, presenting to the ED with left foot injury that occurred yesterday.  Patient states she slipped on her deck yesterday, landing on her buttocks.  She began experiencing left foot pain soon afterward.  She does endorse some overall achiness, but states "the achiness all over was expected and is not bothersome, but I do have quite a bit of pain in my foot."  Pain is throbbing, severe, nonradiating from the dorsal left foot. Denies head injury, midline neck/back pain, numbness, weakness, LOC, nausea/vomiting, wounds, or any other complaints.   Past Medical History:  Diagnosis Date  . Bipolar 1 disorder ([place])   . Cervical cancer ([place])   . GERD (gastroesophageal reflux disease)   . History of tracheostomy 03/04/2017   She had trach placed during surgery and reversed before discharge [facility])  . Hypertension   . Hypothyroid   . Manic depressive disorder ([place])   . Tongue cancer [facility])     Patient Active Problem List   Diagnosis Date Noted  . 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 Squamous cell carcinoma of tongue ([place]) 04/23/2017    Past Surgical History:  Procedure Laterality Date  . bone-skin graft     free osteocutaneous flap with microvascular anastomosis: other than iliac crest, metatarsal or great toe.   . cervical lymphadenectomy  03/04/2017   (modified radical neck dissection) [facility].   . EXCISION OF TONGUE LESION Right 03/04/2017   PR excis tongue, mouth, jaw. [facility])  . exploration, carotid  Left 03/04/2017   [facility].   . GLOSSECTOMY  03/04/2017    Glossectomy; composite WO Radical neck dissect [facility])  . LARYNGOSCOPY  11/22/2016   Laryngoscopy, direct, operative with biopsy, [place]  03/04/2017   Reconstruct mandible/ maxil subperiosteal implant, [facility]  . PARTIAL GLOSSECTOMY    . SKIN FULL THICKNESS GRAFT  03/04/2017   [place]    . VAGINAL HYSTERECTOMY       OB History   No obstetric history on file.     No family history on file.  Social History   Tobacco Use  . Smoking status: Never Smoker  . Smokeless tobacco: Never Used  Substance Use Topics  . Alcohol use: No  . Drug use: No    Home Medications Prior to Admission medications   Medication Sig Start Date End Date Taking? Authorizing Provider  chlorthalidone (HYGROTON) 25 MG tablet Take 25 mg by mouth daily.  02/19/17   [provider]  DEXILANT 60 MG capsule Take 60 mg by mouth daily.  04/16/17   [provider]  diclofenac Sodium (VOLTAREN) 1 % GEL Apply 4 g topically 4 (four) times daily. 05/25/19   Amillion Macchia C, PA-C  HYDROcodone-acetaminophen (NORCO/VICODIN) 5-325 MG tablet Take 1 tablet by mouth every 6 (six) hours as needed for severe pain. 05/25/19   Irie Dowson C, PA-C  ibuprofen (ADVIL,MOTRIN) 200 MG tablet Take 400 mg by mouth every 6 (six) hours as needed for mild pain.  [provider]  lamoTRIgine (LAMICTAL) 150 MG tablet Take 300 mg by mouth daily.    [provider]  levothyroxine (SYNTHROID, LEVOTHROID) 25 MCG tablet Take 25 mcg by mouth daily before breakfast.  02/19/17   [provider]  lidocaine (LIDODERM) 5 % Place 1 patch onto the skin daily. Remove & Discard patch within 12 hours or as directed by MD 05/25/19   Phelix Fudala C, PA-C  lidocaine (XYLOCAINE) 2 % solution Patient: Mix 1part 2% viscous lidocaine, 1part H20. Swish & swallow 84mL of diluted mixture, 80min before meals and at bedtime, up to QID Patient not taking: Reported on  10/25/2017 06/24/17   Eppie Gibson, MD  losartan (COZAAR) 100 MG tablet Take 100 mg by mouth daily.  04/16/17   [provider]  methocarbamol (ROBAXIN) 500 MG tablet Take 1 tablet (500 mg total) by mouth 2 (two) times daily. 05/25/19   Karem Tomaso C, PA-C  ondansetron (ZOFRAN) 8 MG tablet Take 1 tablet (8 mg total) by mouth every 8 (eight) hours as needed for nausea or vomiting. Patient not taking: Reported on 07/09/2017 06/17/17   Eppie Gibson, MD  PARoxetine (PAXIL) 40 MG tablet Take 40 mg by mouth daily.    [provider]  sodium fluoride (FLUORISHIELD) 1.1 % GEL dental gel Instill 1 drop of gel per tooth space of fluoride tray.  Place over teeth for 5 minutes.  Remove.  Spit out excess.  Repeat nightly. 04/25/17   Lenn Cal, DDS    Allergies    Amoxicillin and Iodinated diagnostic agents  Review of Systems   Review of Systems  Constitutional: Negative for diaphoresis.  Respiratory: Negative for shortness of breath.   Cardiovascular: Negative for chest pain.  Gastrointestinal: Negative for abdominal pain, nausea and vomiting.  Musculoskeletal: Positive for arthralgias. Negative for back pain and neck pain.  Neurological: Negative for dizziness, syncope, weakness, numbness and headaches.  All other systems reviewed and are negative.   Physical Exam Updated Vital Signs BP (!) 153/95 (BP Location: Left Arm)   Pulse 91   Temp 98.7 F (37.1 C)   Resp 18   SpO2 99%   Physical Exam Vitals and nursing note reviewed.  Constitutional:      General: She is not in acute distress.    Appearance: She is well-developed. She is not diaphoretic.  HENT:     Head: Normocephalic and atraumatic.     Mouth/Throat:     Mouth: Mucous membranes are moist.     Pharynx: Oropharynx is clear.  Eyes:     Conjunctiva/sclera: Conjunctivae normal.  Cardiovascular:     Rate and Rhythm: Normal rate and regular rhythm.     Pulses: Normal pulses.          Radial pulses are 2+ on the  right side and 2+ on the left side.       Dorsalis pedis pulses are 2+ on the left side.       Posterior tibial pulses are 2+ on the right side and 2+ on the left side.     Comments: Tactile temperature in the extremities appropriate and equal bilaterally. Pulmonary:     Effort: Pulmonary effort is normal. No respiratory distress.  Abdominal:     Palpations: Abdomen is soft.     Tenderness: There is no abdominal tenderness. There is no guarding.  Musculoskeletal:     Cervical back: Neck supple.     Right lower leg: No edema.  Comments: Tenderness to left dorsal distal foot with mild swelling.  No noted deformity, color change, or instability. No tenderness, deformity, swelling, or instability to the left or right ankles, knees, or hips.  No pain with range of motion of these joints.  No pain or tenderness in the joints of the upper extremities.  No pain with range of motion of these joints.  Normal motor function intact in all extremities. No midline spinal tenderness.   Lymphadenopathy:     Cervical: No cervical adenopathy.  Skin:    General: Skin is warm and dry.  Neurological:     Mental Status: She is alert.     Comments: Sensation to light touch grossly intact in the upper and lower extremities. Grip strength equal bilaterally. Strength 5/5 in the upper and lower extremities. Specifically, strength 5/5 in the left ankle and left big toe. Patient seems to be quite uncomfortable with attempted weightbearing on the left.  She states that this causes pain in the left foot, but nowhere else in the left lower extremity.  Psychiatric:        Mood and Affect: Mood and affect normal.        Speech: Speech normal.        Behavior: Behavior normal.     ED Results / Procedures / Treatments   Labs (all labs ordered are listed, but only abnormal results are displayed) Labs Reviewed - No data to display  EKG None  Radiology DG Ankle Complete Left  Result Date:  05/25/2019 CLINICAL DATA:  Fall is today, diffuse ankle pain and swelling, initial encounter. EXAM: LEFT ANKLE COMPLETE - 3+ VIEW COMPARISON:  07/26/2006. FINDINGS: No acute osseous or joint abnormality. Ankle mortise is intact. Resection of the proximal and mid fibula with scattered surgical clips. Tiny calcaneal spurs. IMPRESSION: No acute findings. Electronically Signed   By: Lorin Picket M.D.   On: 05/25/2019 07:58   DG Foot Complete Left  Result Date: 05/25/2019 CLINICAL DATA:  Fall yesterday, diffuse pain, initial encounter. EXAM: LEFT FOOT - COMPLETE 3+ VIEW COMPARISON:  07/26/2006. FINDINGS: No acute osseous or joint abnormality. Flattened appearance of the third metatarsal head is again seen with mild third metatarsophalangeal joint degenerative change. Tiny calcaneal spurs. Partially imaged fibular resection. IMPRESSION: No acute findings. Electronically Signed   By: Lorin Picket M.D.   On: 05/25/2019 07:57    Procedures Procedures (including critical care time)  Medications Ordered in ED Medications - No data to display  ED Course  I have reviewed the triage vital signs and the nursing notes.  Pertinent labs & imaging results that were available during my care of the patient were reviewed by me and considered in my medical decision making (see chart for details).    MDM Rules/Calculators/A&P                      Patient presents with left foot pain following a fall that occurred yesterday.  No evidence of neurovascular compromise.  No acute abnormality on x-rays. Due to the patient's amount of pain, despite lack of abnormalities on x-ray, patient was placed in a cam boot and given crutches.  Orthopedic follow-up. The patient was given instructions for home care as well as return precautions. Patient voices understanding of these instructions, accepts the plan, and is comfortable with discharge.  Final Clinical Impression(s) / ED Diagnoses Final diagnoses:  Injury of left  foot, initial encounter    Rx / DC Orders ED Discharge  Orders         Ordered    methocarbamol (ROBAXIN) 500 MG tablet  2 times daily     05/25/19 1313    lidocaine (LIDODERM) 5 %  Every 24 hours     05/25/19 1313    diclofenac Sodium (VOLTAREN) 1 % GEL  4 times daily     05/25/19 1313    HYDROcodone-acetaminophen (NORCO/VICODIN) 5-325 MG tablet  Every 6 hours PRN     05/25/19 1313           Lorayne Bender, PA-C 05/25/19 2200    Isla Pence, MD 05/26/19 [phone]

## 2019-06-03 ENCOUNTER — Ambulatory Visit (INDEPENDENT_AMBULATORY_CARE_PROVIDER_SITE_OTHER): Payer: Medicare Other | Admitting: Psychology

## 2019-06-03 DIAGNOSIS — F319 Bipolar disorder, unspecified: Secondary | ICD-10-CM | POA: Diagnosis not present

## 2019-07-06 ENCOUNTER — Ambulatory Visit (INDEPENDENT_AMBULATORY_CARE_PROVIDER_SITE_OTHER): Payer: Medicare Other | Admitting: Psychology

## 2019-07-06 DIAGNOSIS — F319 Bipolar disorder, unspecified: Secondary | ICD-10-CM | POA: Diagnosis not present

## 2019-08-10 ENCOUNTER — Ambulatory Visit: Payer: Medicare Other | Admitting: Psychology

## 2019-08-18 ENCOUNTER — Ambulatory Visit (INDEPENDENT_AMBULATORY_CARE_PROVIDER_SITE_OTHER): Payer: Medicare Other | Admitting: Psychology

## 2019-08-18 DIAGNOSIS — F319 Bipolar disorder, unspecified: Secondary | ICD-10-CM | POA: Diagnosis not present

## 2019-09-23 ENCOUNTER — Ambulatory Visit (INDEPENDENT_AMBULATORY_CARE_PROVIDER_SITE_OTHER): Payer: Medicare Other | Admitting: Psychology

## 2019-09-23 DIAGNOSIS — F319 Bipolar disorder, unspecified: Secondary | ICD-10-CM

## 2019-10-15 NOTE — Progress Notes (Signed)
Ms. Fulwiler presents for follow up of radiation completed 06/18/2017 to her head/neck and tongue.   Pain issues, if any: Patient denies Using a feeding tube?: N/A Weight changes, if any:  Wt Readings from Last 3 Encounters:  10/16/19 181 lb 12.8 oz (82.5 kg)  03/13/19 175 lb 9.6 oz (79.7 kg)  04/11/18 175 lb 6.4 oz (79.6 kg)   Swallowing issues, if any: None. Eating and drinking well Smoking or chewing tobacco? None Using fluoride trays daily? Continues to utilize Last ENT visit was on: 08/13/2019: Saw Dr. Colin Benton: "No Evidence of Disease. 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 The patient is doing well today. I encouraged the patient to wash oral cavity with warm salt water as needed. If she has recurrent exposure of the bone, the patient is allergic to call me so we can discuss treatment options which could include antibiotics, Trental, or consideration for hyperbaric oxygen. At this time however I see no reason to push forward with any additional treatment. FOLLOW-UP with me in July, RTC sooner as needed"  Other notable issues, if any: Nothing of note  Vitals:   10/16/19 0954  BP: (!) 154/82  Pulse: (!) 102  Resp: 20  Temp: (!) 97.3 F (36.3 C)  SpO2: 97%

## 2019-10-16 ENCOUNTER — Other Ambulatory Visit: Payer: Self-pay

## 2019-10-16 ENCOUNTER — Ambulatory Visit
Admission: RE | Admit: 2019-10-16 | Discharge: 2019-10-16 | Disposition: A | Discharge status: Home / Self Care | Payer: Medicare Other | Source: Ambulatory Visit | Attending: Radiation Oncology | Admitting: Radiation Oncology

## 2019-10-16 ENCOUNTER — Encounter: Payer: Self-pay | Admitting: Radiation Oncology

## 2019-10-16 VITALS — BP 154/82 | HR 102 | Temp 97.3°F | Resp 20 | Ht 66.0 in | Wt 181.8 lb

## 2019-10-16 DIAGNOSIS — Z79899 Other long term (current) drug therapy: Secondary | ICD-10-CM | POA: Diagnosis not present

## 2019-10-16 DIAGNOSIS — Z923 Personal history of irradiation: Secondary | ICD-10-CM | POA: Diagnosis not present

## 2019-10-16 DIAGNOSIS — Z85810 Personal history of malignant neoplasm of tongue: Secondary | ICD-10-CM | POA: Insufficient documentation

## 2019-10-16 DIAGNOSIS — C021 Malignant neoplasm of border of tongue: Secondary | ICD-10-CM

## 2019-10-20 ENCOUNTER — Other Ambulatory Visit: Payer: Self-pay | Admitting: Radiation Oncology

## 2019-10-20 ENCOUNTER — Telehealth: Payer: Self-pay | Admitting: Medical

## 2019-10-20 ENCOUNTER — Telehealth: Payer: Self-pay | Admitting: *Deleted

## 2019-10-20 DIAGNOSIS — C021 Malignant neoplasm of border of tongue: Secondary | ICD-10-CM

## 2019-10-20 NOTE — Progress Notes (Signed)
Radiation Oncology         [phone] ________________________________  Name: Nancy [name] MRN: [id number]  Date: 10/16/2019  DOB: Feb 16, 1961  Follow-Up Visit Note  CC: [name], MD  Colin Benton, MD  Diagnosis and Prior Radiotherapy:       ICD-10-CM   1. 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 (Comprehensive nodal radiation was not conducted)  CHIEF COMPLAINT:  Here for follow-up and surveillance of head and neck cancer  Narrative:    Nancy [name] presents for follow up of radiation completed 06/18/2017 to her head/neck and tongue.   Pain issues, if any: Patient denies Using a feeding tube?: N/A Weight changes, if any:  Wt Readings from Last 3 Encounters:  10/16/19 181 lb 12.8 oz (82.5 kg)  03/13/19 175 lb 9.6 oz (79.7 kg)  04/11/18 175 lb 6.4 oz (79.6 kg)   Swallowing issues, if any: None. Eating and drinking well Smoking or chewing tobacco? None Using fluoride trays daily? Continues to utilize Last ENT visit was on: 08/13/2019: Saw Dr. Colin Benton: "No Evidence of Disease. 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 The patient is doing well today. I encouraged the patient to wash oral cavity with warm salt water as needed. If she has recurrent exposure of the bone, the patient is allergic to call me so we can discuss treatment options which could include antibiotics, Trental, or consideration for hyperbaric oxygen. At this time however I see no reason to push forward with any additional treatment. FOLLOW-UP with me in July, RTC sooner  as needed"    She continues to be with psychiatry.  She has been vaccinated against Covid.  ALLERGIES:  is allergic to amoxicillin and iodinated diagnostic agents.  Meds: Current Outpatient Medications  Medication Sig Dispense Refill  . chlorthalidone (HYGROTON) 25 MG tablet Take 25 mg by mouth daily.     . Cholecalciferol 125 MCG (5000 UT) capsule Take 5,000 Units by mouth daily.    Marland Kitchen DEXILANT 60 MG capsule Take 60 mg by mouth daily.     Marland Kitchen lamoTRIgine (LAMICTAL) 150 MG tablet Take 300 mg by mouth daily.    Marland Kitchen levothyroxine (SYNTHROID, LEVOTHROID) 25 MCG tablet Take 25 mcg by mouth daily before breakfast.     . losartan (COZAAR) 100 MG tablet Take 100 mg by mouth daily.     Marland Kitchen PARoxetine (PAXIL) 40 MG tablet Take 40 mg by mouth daily.    . silver sulfADIAZINE (SILVADENE) 1 % cream Apply 1 application topically daily. Triamcinolone acetonide 0.1% and silver sulfadiazine 1% 1:1 ratio    . sodium fluoride (FLUORISHIELD) 1.1 % GEL dental gel Instill 1 drop of gel per tooth space of fluoride tray.  Place over teeth for 5 minutes.  Remove.  Spit out excess.  Repeat nightly. 120 mL prn  . diclofenac Sodium (VOLTAREN) 1 % GEL Apply 4 g topically 4 (four) times daily. (Patient not taking: Reported on 10/16/2019) 100 g 2  . HYDROcodone-acetaminophen (NORCO/VICODIN) 5-325 MG tablet Take 1 tablet by mouth every 6 (six) hours as needed for severe pain. 10 tablet  0  . ibuprofen (ADVIL,MOTRIN) 200 MG tablet Take 400 mg by mouth every 6 (six) hours as needed for mild pain.     Marland Kitchen lidocaine (LIDODERM) 5 % Place 1 patch onto the skin daily. Remove & Discard patch within 12 hours or as directed by MD 30 patch 0  . lidocaine (XYLOCAINE) 2 % solution Patient: Mix 1part 2% viscous lidocaine, 1part H20. Swish & swallow 109mL of diluted mixture, 63min before meals and at bedtime, up to QID (Patient not taking: Reported on 10/25/2017) 300 mL 2  . methocarbamol (ROBAXIN) 500 MG tablet Take 1 tablet (500 mg total) by mouth 2  (two) times daily. 20 tablet 0  . ondansetron (ZOFRAN) 8 MG tablet Take 1 tablet (8 mg total) by mouth every 8 (eight) hours as needed for nausea or vomiting. (Patient not taking: Reported on 07/09/2017) 30 tablet 2   No current facility-administered medications for this encounter.    Physical Findings: The patient is in no acute distress. Patient is alert and oriented. Wt Readings from Last 3 Encounters:  10/16/19 181 lb 12.8 oz (82.5 kg)  03/13/19 175 lb 9.6 oz (79.7 kg)  04/11/18 175 lb 6.4 oz (79.6 kg)    height is 5\' 6"  (1.676 m) and weight is 181 lb 12.8 oz (82.5 kg). Her temperature is 97.3 F (36.3 C) (abnormal). Her blood pressure is 154/82 (abnormal) and her pulse is 102 (abnormal). Her respiration is 20 and oxygen saturation is 97%. .  General: Alert and oriented, in no acute distress.  HEENT: Head is normocephalic. Extraocular movements are intact.  No thrush, and no visible lesions in the oral cavity or oropharynx.  No exposed bone in the mandible. Neck: No palpable masses and skin is intact Skin: Skin in treatment fields shows satisfactory healing. Chest: CTAB Heart: RRR no murmurs Lymphatics: see Neck Exam Psychiatric: Judgment and insight are intact. Affect is appropriate.   Lab Findings: Lab Results  Component Value Date   WBC 5.3 03/05/2018   HGB 14.3 03/05/2018   HCT 41.1 03/05/2018   MCV 93.0 03/05/2018   PLT 209 03/05/2018    No results found for: TSH  Radiographic Findings: No results found.  Impression/Plan:    1) Head and Neck Cancer Status: NED  2) Nutritional Status: stable PEG tube: None  3) Risk Factors: The patient has been educated about risk factors including alcohol and tobacco abuse; they understand that avoidance of alcohol and tobacco is important to prevent recurrences as well as other cancers  4) Swallowing: No issues at this time.  5) Dental: Encouraged to continue regular followup with dentistry, and dental hygiene including  fluoride rinses.   6) Thyroid function: No results found for: TSH May check annually w/ PCP.  Unlikely to be affected by RT which was unilateral and not comprehensive to whole neck  7) Follow-up in rad/onc in 57mo. I'll refer her to survivorship clinic to be seen by Sandi Mealy in 54mo. The patient was encouraged to call with any issues or questions before then.   On date of service, in total, I spent 25 minutes on this encounter.  She was seen in person.   _____________________________________   Eppie Gibson, MD

## 2019-10-20 NOTE — Telephone Encounter (Signed)
Scheduled appt per 6/1 sch msg. Left voicemail with appt date and time.

## 2019-10-20 NOTE — Telephone Encounter (Signed)
Called [facility] to arrange - 6 months survivorship appt. for this patient ,  lvm for a return call

## 2019-11-12 ENCOUNTER — Ambulatory Visit (INDEPENDENT_AMBULATORY_CARE_PROVIDER_SITE_OTHER): Payer: Medicare Other | Admitting: Psychology

## 2019-11-12 DIAGNOSIS — F319 Bipolar disorder, unspecified: Secondary | ICD-10-CM | POA: Diagnosis not present

## 2019-12-24 ENCOUNTER — Ambulatory Visit (INDEPENDENT_AMBULATORY_CARE_PROVIDER_SITE_OTHER): Payer: Medicare Other | Admitting: Psychology

## 2019-12-24 DIAGNOSIS — F319 Bipolar disorder, unspecified: Secondary | ICD-10-CM

## 2020-02-09 ENCOUNTER — Ambulatory Visit: Payer: Medicare Other | Admitting: Psychology

## 2020-04-22 ENCOUNTER — Other Ambulatory Visit: Payer: Self-pay

## 2020-04-22 ENCOUNTER — Telehealth: Payer: Self-pay | Admitting: Medical

## 2020-04-22 ENCOUNTER — Inpatient Hospital Stay: Payer: Medicare Other | Attending: Medical | Admitting: Medical

## 2020-04-22 VITALS — BP 134/87 | HR 67 | Temp 97.8°F | Resp 18 | Wt 179.6 lb

## 2020-04-22 DIAGNOSIS — C021 Malignant neoplasm of border of tongue: Secondary | ICD-10-CM

## 2020-04-22 DIAGNOSIS — F17210 Nicotine dependence, cigarettes, uncomplicated: Secondary | ICD-10-CM | POA: Insufficient documentation

## 2020-04-22 DIAGNOSIS — C049 Malignant neoplasm of floor of mouth, unspecified: Secondary | ICD-10-CM

## 2020-04-22 DIAGNOSIS — C029 Malignant neoplasm of tongue, unspecified: Secondary | ICD-10-CM

## 2020-04-22 DIAGNOSIS — Z79899 Other long term (current) drug therapy: Secondary | ICD-10-CM | POA: Diagnosis not present

## 2020-04-22 NOTE — Telephone Encounter (Signed)
Scheduled appointments per 12/3 los. Spoke to patient who is aware of appointments dates and times.  

## 2020-04-25 NOTE — Progress Notes (Signed)
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    1) History of benign and malignant lesions of the tongue dating to 2004. 2) History of a moderately differentiated squamous cell carcinoma of the right tongue dating to June 2017. 3) Patient discovers a new lesion on her right tongue and May 2018. 4) A biopsy was completed which returned positive for a squamous cell carcinoma of the tongue. 5) She was taken for a surgical excision of the tongue on 10/17/2016 with pathology returning showing:   [place] - 11/11/2016 7:11 PM EDT  ACCESSION NUMBER: [id number] RECEIVED: 10/30/2016 ORDERING PHYSICIAN: Bolivar Haw , MD PATIENT NAME: Nancy [name] SURGICAL PATHOLOGY REPORT    FINAL PATHOLOGIC DIAGNOSIS MICROSCOPIC EXAMINATION AND DIAGNOSIS  TONGUE, RIGHT, EXCISION (OUTSIDE SLIDES [phone], 10/17/16, [facility], [place], ):  1. TONGUE, RIGHT, EXCISION: Invasive squamous cell carcinoma involving all margins.  2. TONGUE, POSTERIOR ASPECT, EXCISION: Invasive squamous cell carcinoma involving all margins.  3. TONGUE, ANTERIOR MARGIN, BIOPSY: Benign squamous mucosa. No malignancy identified.  4. TONGUE, SUPERIOR MARGIN, BIOPSY: Suspicious for invasive squamous cell carcinoma.  5. TONGUE, INFERIOR MARGIN, BIOPSY: Invasive squamous cell carcinoma.  6. TONGUE, POSTERIOR MARGIN, BIOPSY: Benign squamous mucosa. No malignancy  identified.  7. TONGUE, DEEP MARGIN, BIPOSY: Invasive squamous cell carcinoma.  8. TONGUE, DEEP MARGIN, BIOPSY: Invasive squamous cell carcinoma.   gep COMMENT: The patient's previously diagnosed squamous cell carcinoma of the right tongue is noted ([id number]). See protocol below.  College [employer]ologists Surgical Pathology Cancer Case Summary:                  LIP AND ORAL CAVITY  Protocol posting date: June, 2017 Version: LipOralCavity 4.0.0.0  PROCEDURE: Excision TUMOR SITE: Right tongue TUMOR LATERALITY: Right TUMOR SIZE: At least 1.3 cm TUMOR DEPTH OF INVASION (DOI): at least 13 mm HISTOLOGIC TYPE: Squamous cell carcinoma, conventional HISTOLOGIC GRADE: Moderately differentiated SPECIMEN MARGINS: Involved   SPECIFY MARGIN(S): Deep, inferior, superior TUMOR BED (SEPARATELY SUBMITTED)  MARGIN ORIENTATION: Unoriented  MARGINS: Deep margin involved LYMPHOVASCULAR INVASION: Not identified PERINEURAL INVASION: Not identified REGIONAL LYMPH NODES: N/A NUMBER OF LYMPH NODES INVOLVED: N/A NUMBER OF LYMPH NODES EXAMINED: N/A LATERALITY OF LYMPH NODES INVOLVED: N/A SIZE OF LARGEST METASTATIC DEPOSIT: N/A EXTRANODAL EXTENSION: N/A PATHOLOGIC STAGE CLASSIFICATION (pTNM, AJCC 8th Ed): pT2, pNX DISTANT METASTASIS (pM): N/A   6) Evaluated at [facility] with recommendations for a right hemiglossectomy, radial forearm free flap reconstruction as well as bilateral selective neck dissection.  7) Presented to [facility] on 11/14/2016 for a second opinion.  Recommended a partial glossectomy and right neck dissection with reconstruction using either radial forearm or lateral arm free flaps.  8) CT scans completed at [facility] on 11/16/2016 with results showing:   FINDINGS:  CHEST:  .Thoracic inlet/axilla: Please refer to contemporaneous CT neck.  .Central airways: Within normal  limits.  .Mediastinum/hila: Calcified bilateral  hilar and subcarinal lymph nodes.   Marland KitchenHeart/vessel: Normal heart size. No pericardial effusion. Aorta normal in caliber and appearance. No pulmonary embolism.   .Lungs: Nodule adjacent to the major fissure in the left lower lobe (series 3, image 115) measuring 0.7 x 0.5 cm. Left lower lobe nodule measuring 1.5 x 1.1 x 1.2 cm with central calcification consistent with a calcified granuloma.  .Pleura: Within normal limits.     ABDOMEN:  .Liver: Within normal limits.  .Gallbladder/biliary: Within normal limits.  .Spleen: Within normal limits.  .Pancreas: Within normal limits.  .Adrenals: Within normal limits.  .Kidneys: 2 cm right upper polar hypodensity consistent with a cyst.    .Peritoneum/Mesentery/Extraperitoneum: Tiny umbilical hernia. Calcified epiploic appendage.  .GI tract: Unremarkable.  .Vascular: Within normal limits.    PELVIS:  .Ureters: Within normal limits.  .Bladder: Within normal limits.  .Reproductive system: Hysterectomy.    MSK:  .Within normal limits.  9) Seen by Dr. Colin Benton at [facility] on 11/22/2016. 10) Induction chemotherapy with cetuximab, carboplatin, and paclitaxel with filgrastim support was dosed at [facility] on 12/31/2016 under the direction of Dr. Donalynn Furlong.  11) Surgery for right composite resection, right MRND 1-4, trach and left fibula free flap reconstruction on 03/04/2017.  Pathology returned showing:   SPECIMEN  Procedure Mandible, tongue and floor of mouth, composite resection  TUMOR  Tumor Site Oral  Lateral border of tongue  Floor of mouth  Tumor Laterality Right  Histologic Type Squamous cell carcinoma, conventional  Histologic Grade G2: Moderately differentiated  Tumor Size Greatest dimension in Centimeters (cm): 0.9 Centimeters (cm)  Tumor Depth of Invasion (DOI) in Millimeters (mm) Specify in Millimeters  (mm): 32mm Millimeters (mm)  Tumor Focality Unifocal  Accessory Findings  Lymphovascular Invasion Not identified  Perineural Invasion Not identified  MARGINS  Specimen Margins Uninvolved by invasive tumor  Distance from Closest Margin in Millimeters (mm) 19mm Millimeters (mm)  Location of Closest Margin, per Orientation Deep soft tissue  Location and Distance of Other Close Margins 83mm Midline floor of mouth and tongue margin  Status of Non-Invasive Tumor at Margins Not applicable  LYMPH NODES   Number Involved 0  Number of Lymph Nodes Examined 33  PATHOLOGIC STAGE CLASSIFICATION (pTNM, AJCC 8th Edition)  TNM Descriptors y (post-treatment)   Primary Tumor (pT) pT2  Regional Lymph Nodes (pN) pN0    12) Seen in the lymphedema clinic on 03/21/2017. 13)  Seen initially by Dr. Eppie Gibson on 04/23/2017. 14) 05/06/17 - 06/18/17 60 Gy adjuvantly directed to the tumor bed/tongue (and immediately adjacent nodes) in 30 fractions. (Comprehensive nodal radiation was not conducted)    INTERVAL HISTORY:    Nancy [name] is a 59 y.o. 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 She presents to clinic today for routine follow up having last been seen by Dr. Isidore Moos on 10/16/2019. She reports that she is noted a small superficial lesion along the surgical incision in her right lower anterior jaw.  She is calling her dentist Dr. Hardin Negus to move up her upcoming appointment.  She presents to the clinic today for her first survivorship meeting.  She reports that she is doing well overall.  She continues to have dryness of her mouth but otherwise denies any episodes of dysphagia or swelling of the neck.     REVIEW OF SYSTEMS:   Visual changes:    no   Swelling of the face:  no   Swelling of the mouth:    no   Swelling of the throat:    no   Dental problems:    no   Skin changes at treatment site:  no   Dry mouth:     yes    Difficulty talking:    no  Thickened saliva:    no   Increased mucus:    no  Difficulty opening mouth:   no  Fatigue:     no   Pain or difficulty swallowing or chewing: no    Loss of appetite:    no   Numbness of the ear:    no  Weakness raising the arm(s):  no  Lack of lip movement:    no  Facial disfigurement:    no  Numbness of face or neck:   yes  Loss of voice or impaired speech:  no  Hearing difficulty:    no  Lymphedema:     no  Pain:      no       -Weight: has not lost weight      -Last ENT visit:    08/13/2019   -Last Rad Onc visit:   10/16/2019   -Last Dentist visit:   06/23/2019     CURRENT MEDICATIONS:    Current Outpatient Medications on File Prior to Visit  Medication Sig Dispense Refill  . Cholecalciferol 125 MCG (5000 UT) capsule Take 5,000 Units by mouth daily.    Marland Kitchen DEXILANT 60 MG capsule Take 60 mg by mouth daily.     Marland Kitchen lamoTRIgine (LAMICTAL) 150 MG tablet Take 300 mg by mouth daily.    Marland Kitchen levothyroxine (SYNTHROID, LEVOTHROID) 25 MCG tablet Take 25 mcg by mouth daily before breakfast.     . losartan (COZAAR) 100 MG tablet Take 100 mg by mouth daily.     Marland Kitchen PARoxetine (PAXIL) 40 MG tablet Take 40 mg by mouth daily.    . silver sulfADIAZINE (SILVADENE) 1 % cream Apply 1 application topically daily. Triamcinolone acetonide 0.1% and silver sulfadiazine 1% 1:1 ratio    . sodium fluoride (FLUORISHIELD) 1.1 % GEL dental gel Instill 1 drop of gel per tooth space of fluoride tray.  Place over teeth for 5 minutes.  Remove.  Spit out excess.  Repeat nightly. 120 mL prn  . chlorthalidone (HYGROTON) 25 MG tablet Take 25 mg by mouth daily.     . diclofenac Sodium (VOLTAREN) 1 % GEL Apply 4 g topically 4 (four) times daily. (Patient not taking: Reported on 10/16/2019) 100 g 2  . HYDROcodone-acetaminophen (NORCO/VICODIN) 5-325 MG tablet Take 1 tablet by mouth every 6 (six) hours as needed for severe pain. (Patient not taking: Reported on 04/22/2020) 10  tablet 0  . ibuprofen (ADVIL,MOTRIN) 200 MG tablet Take 400 mg by mouth every 6 (six) hours as needed for mild pain.     Marland Kitchen lidocaine (LIDODERM) 5 % Place 1 patch onto the skin daily. Remove & Discard patch within 12 hours or as directed by MD 30 patch 0  . lidocaine (XYLOCAINE) 2 % solution Patient: Mix 1part 2% viscous lidocaine, 1part H20. Swish & swallow 60mL of diluted mixture, 93min before meals and at bedtime, up to QID (Patient not taking: Reported on 10/25/2017) 300 mL 2  . methocarbamol (ROBAXIN) 500 MG tablet Take 1 tablet (500 mg total) by mouth 2 (two) times daily. (Patient not taking: Reported on 04/22/2020) 20 tablet 0  . ondansetron (ZOFRAN) 8 MG tablet Take 1  tablet (8 mg total) by mouth every 8 (eight) hours as needed for nausea or vomiting. (Patient not taking: Reported on 07/09/2017) 30 tablet 2   No current facility-administered medications on file prior to visit.        ALLERGIES:    Allergies  Allergen Reactions  . Amoxicillin Hives and Rash    Has patient had a PCN reaction causing immediate rash, facial/tongue/throat swelling, SOB or lightheadedness with hypotension: no Has patient had a PCN reaction causing severe rash involving mucus membranes or skin necrosis: no Has patient had a PCN reaction that required hospitalization: no Has patient had a PCN reaction occurring within the last 10 years: no If all of the above answers are "NO", then may proceed with Cephalosporin use.   . Iodinated Diagnostic Agents Hives    Pt developed a single hive on the chest.          ADDITIONAL REVIEW OF SYSTEMS:    Review of Systems  Constitutional: Negative for chills, fever, malaise/fatigue and weight loss.  HENT: Negative for ear pain, hearing loss, sore throat and tinnitus.        No difficulty swallowing  Respiratory: Negative for cough, sputum production and shortness of breath.   Cardiovascular: Negative for chest pain, palpitations, orthopnea and leg swelling.   Gastrointestinal: Negative for abdominal pain, blood in stool, constipation, diarrhea, melena, nausea and vomiting.  Genitourinary: Negative for dysuria, frequency and urgency.  Musculoskeletal: Negative for back pain, myalgias and neck pain.  Skin: Negative for rash.  Neurological: Negative for dizziness, speech change, weakness and headaches.        PHYSICAL EXAM:    Vitals:   04/22/20 1001  BP: 134/87  Pulse: 67  Resp: 18  Temp: 97.8 F (36.6 C)  SpO2: 97%    Filed Weights   04/22/20 1001  Weight: 179 lb 9.6 oz (81.5 kg)    Weight Date  179.6 pounds  04/22/2020  181. pounds 12.8 ounces  10/16/2019  175. pounds 9.6 ounces  03/13/2019  175 pounds 6.4 ounces  04/11/2018         Pre-treatment (RT consult date):     General: Nancy [name] is a 59 y.o. female who appears to be in no acute distress.? Nancy [name] is accompanied/unaccompanied today.    HEENT:    Head: There is a well-healed surgical incision over the chin and right neck.  ?    Pupils: equal and reactive to light.    Conjunctivae: clear without exudate.?    Sclerae: anicteric.    Oral mucosa: pink and moist without lesions.?    Tongue: pink, moist, and midline.  There is an extensive area of scarring in the right lateral tongue.  Oropharynx: There is a small superficial lesion along the bottom front gums.  Lymph: No preauricular, postauricular, cervical, supraclavicular, or infraclavicular lymphadenopathy noted on palpation. ?   Neck: No palpable masses. Skin on neck is thickened and without nodularity or masses..    Cardiovascular: Regular rate and rhythm without murmurs, rubs, or gallops.Marland Kitchen   Respiratory: Clear to auscultation bilaterally without wheezes, rales, or rhonchi. Chest expansion symmetric without accessory muscle use; breathing non-labored.?   GI: Abdomen soft and round. Bowel sounds normoactive. No hepatosplenomegaly, masses,  tenderness, rebound, guarding, or distention.    GU: Deferred. ?   Neuro: No focal deficits. Steady gait. ?   Psych: Normal mood and affect for situation.   Extremities: No edema.?   Skin: Warm and dry. No rashes  or lesions.      LABORATORY DATA:    The patient's complete chemistry panel returned showing:   Lab Results  Component Value Date   NA 142 03/05/2018   NA 139 05/06/2017   K 3.6 03/05/2018   K 4.1 05/06/2017   CL 108 03/05/2018   CO2 26 03/05/2018   CO2 28 05/06/2017   GLUCOSE 126 (H) 03/05/2018   GLUCOSE 104 05/06/2017   BUN 9 03/05/2018   BUN 11.4 05/06/2017   CREATININE 0.64 03/05/2018   CREATININE 0.8 05/06/2017   ALBUMIN 4.6 05/06/2017   ALKPHOS 78 05/06/2017   ALT 19 05/06/2017   AST 16 05/06/2017   BILITOT 0.35 05/06/2017        The patient's TSH returned at: No results found for: TSH      The patient's complete blood count returned showing:      Component Value Date/Time   WBC 5.3 03/05/2018 1334   RBC 4.42 03/05/2018 1334   HGB 14.3 03/05/2018 1334   HGB 14.0 05/06/2017 1132   HCT 41.1 03/05/2018 1334   HCT 40.9 05/06/2017 1132   PLT 209 03/05/2018 1334   PLT 263 05/06/2017 1132   MCV 93.0 03/05/2018 1334   MCV 95.4 05/06/2017 1132   MCH 32.4 03/05/2018 1334   MCHC 34.8 03/05/2018 1334   RDW 12.8 03/05/2018 1334   RDW 12.6 05/06/2017 1132   LYMPHSABS 1.4 03/05/2018 1334   LYMPHSABS 2.6 05/06/2017 1132   MONOABS 0.4 03/05/2018 1334   MONOABS 0.5 05/06/2017 1132   EOSABS 0.1 03/05/2018 1334   EOSABS 0.1 05/06/2017 1132   BASOSABS 0.0 03/05/2018 1334   BASOSABS 0.1 05/06/2017 1132        DIAGNOSTIC IMAGING:    No results found.       ASSESSMENT & PLAN:    Nancy [name] is a 59 y.o. 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 She was treated with surgery and radiation therapy with radiation therapy completed on which she completed treatment on 06/18/2017.  She presents to the survivorship clinic  today for her first survivorship follow-up having last been seen by Dr. Isidore Moos on 10/16/2019.      1. 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    She has noted a new small superficial lesion along her anterior lower jaw along a surgical incision.  She is contacting her dentist Dr. Hardin Negus to move up her appointment so that she can be seen now.     2. Nutritional status: Nancy [name] reports that she is currently able to consume adequate nutrition by mouth.  Her weight is stable at 179.6 lbs today.  She was encouraged to continue to consume adequate hydration and nutrition, as tolerated.        3. 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  She reports having no difficulty with any foods other than very spicy foods. She is able to consume soft foods and liquids without difficulty.      4.  At risk for neck lymphedema:  When patients with head & neck cancers are treated with surgery and/or radiation therapy, there is an  associated increased risk of neck lymphedema.  Nancy [name] reports that currently she is not experiencing symptoms.     5. At risk for tooth decay/dental concerns: After treatment with radiation for head & neck cancers, patients often experience dry mouth which increases their risk of dental caries. Nancy [name] was encouraged to see her dentist 4 times per year.     6. Tobacco & alcohol use: Nancy [name]     7. Health maintenance and wellness promotion: Cancer patients who consume a diet rich in fruits and vegetables have better overall health and decreased risk of cancer recurrence. Nancy [name] was encouraged to consume a well-balanced  diet.      DISPOSITION:    -See Dr. Watha Callas (ENT) in February, 2022  -Return to cancer center to see Dr. Eppie Gibson on 10/14/2020.   -Return to the cancer center to see Dr. Chryl Heck on 10/21/2020  -Return to cancer center to see Survivorship PA in October, 2022.    Sandi Mealy, MHS, PA-C Physician Assistant  04/25/20   2:58 PM        NOTE: PRIMARY CARE PROVIDER   [name], MD   Phone: [phone]   Fax: [phone]

## 2020-05-31 ENCOUNTER — Telehealth: Payer: Self-pay

## 2020-05-31 NOTE — Telephone Encounter (Signed)
I received a secure chat from Sandi Mealy, [place] requesting that I reach out to the patient and follow up on her dentist appointment that she was supposed to schedule with Dr. Hardin Negus. Per Lucianne Lei, the patient is supposed to see her dentist and then have an appointment scheduled to see Dr. Chryl Heck afterwards. I was unable to reach the Patient when I called her today but I left a voicemail requesting a return call in order to touch base.

## 2020-06-02 ENCOUNTER — Telehealth: Payer: Self-pay | Admitting: Hematology and Oncology

## 2020-06-02 NOTE — Telephone Encounter (Signed)
Rescheduled appointments per MD schedule change. Mailed updated calendar to patient with appointments date and times.

## 2020-06-28 ENCOUNTER — Other Ambulatory Visit: Payer: Self-pay

## 2020-06-28 ENCOUNTER — Telehealth: Payer: Self-pay

## 2020-08-11 ENCOUNTER — Other Ambulatory Visit: Payer: Self-pay

## 2020-08-11 ENCOUNTER — Encounter (HOSPITAL_BASED_OUTPATIENT_CLINIC_OR_DEPARTMENT_OTHER): Payer: Medicare Other | Attending: Internal Medicine | Admitting: Internal Medicine

## 2020-08-11 DIAGNOSIS — L598 Other specified disorders of the skin and subcutaneous tissue related to radiation: Secondary | ICD-10-CM | POA: Insufficient documentation

## 2020-08-11 DIAGNOSIS — M272 Inflammatory conditions of jaws: Secondary | ICD-10-CM | POA: Insufficient documentation

## 2020-08-11 NOTE — Progress Notes (Signed)
ELISIA, STEPP ([id number]) Visit Report for 08/11/2020 Allergy List Details Patient Name: Date of Service: Nancy [name], Nancy [name] [name] 08/11/2020 1:15 PM Medical Record Number: [id number] Patient Account Number: [id number] Date of Birth/Sex: Treating RN: 1960/11/16 (60 y.o. Female) Levan Hurst Primary Care Sola Margolis: [name] Other Clinician: Referring Lunette Tapp: Treating Felice Deem/Extender: Rosana Hoes, [name] in Treatment: 0 Allergies Active Allergies Iodinated Contrast Media amoxicillin Allergy Notes Electronic Signature(s) Signed: 08/11/2020 5:04:27 PM By: Levan Hurst RN, BSN Entered By: Levan Hurst on 08/11/2020 13:33:01 -------------------------------------------------------------------------------- Arrival Information Details Patient Name: Date of Service: [name] 08/11/2020 1:15 PM Medical Record Number: [id number] Patient Account Number: [id number] Date of Birth/Sex: Treating RN: Nov 30, 1960 (60 y.o. Female) Levan Hurst Primary Care Harpreet Signore: [name] Other Clinician: Referring Akari Defelice: Treating Oziah Vitanza/Extender: Cleophas Dunker in Treatment: 0 Visit Information Patient Arrived: Ambulatory Arrival Time: 13:23 Accompanied By: mother Transfer Assistance: None Patient Identification Verified: Yes Secondary Verification Process Completed: Yes Patient Requires Transmission-Based Precautions: No Patient Has Alerts: No Electronic Signature(s) Signed: 08/11/2020 5:04:27 PM By: Levan Hurst RN, BSN Entered By: Levan Hurst on 08/11/2020 13:29:20 -------------------------------------------------------------------------------- Clinic Level of Care Assessment Details Patient Name: Date of Service: Jansen, DA [name] 08/11/2020 1:15 PM Medical Record Number: [id number] Patient Account Number: [id number] Date of Birth/Sex: Treating RN: [birth year] (60 y.o. Female) [name] Primary Care Neylan Koroma:  [name] Other Clinician: Referring Victoire Deans: Treating Adja Ruff/Extender: Cleophas Dunker in Treatment: 0 Clinic Level of Care Assessment Items TOOL 2 Quantity Score X- 1 0 Use when only an EandM is performed on the INITIAL visit ASSESSMENTS - Nursing Assessment / Reassessment X- 1 20 General Physical Exam (combine w/ comprehensive assessment (listed just below) when performed on new pt. evals) X- 1 25 Comprehensive Assessment (HX, ROS, Risk Assessments, Wounds Hx, etc.) ASSESSMENTS - Wound and Skin A ssessment / Reassessment []  - 0 Simple Wound Assessment / Reassessment - one wound []  - 0 Complex Wound Assessment / Reassessment - multiple wounds X- 1 10 Dermatologic / Skin Assessment (not related to wound area) ASSESSMENTS - Ostomy and/or Continence Assessment and Care []  - 0 Incontinence Assessment and Management []  - 0 Ostomy Care Assessment and Management (repouching, etc.) PROCESS - Coordination of Care X - Simple Patient / Family Education for ongoing care 1 15 []  - 0 Complex (extensive) Patient / Family Education for ongoing care []  - 0 Staff obtains Programmer, systems, Records, T Results / Process Orders est []  - 0 Staff telephones HHA, Nursing Homes / Clarify orders / etc []  - 0 Routine Transfer to another Facility (non-emergent condition) []  - 0 Routine Hospital Admission (non-emergent condition) []  - 0 New Admissions / Biomedical engineer / Ordering NPWT Apligraf, etc. , []  - 0 Emergency Hospital Admission (emergent condition) X- 1 10 Simple Discharge Coordination []  - 0 Complex (extensive) Discharge Coordination PROCESS - Special Needs []  - 0 Pediatric / Minor Patient Management []  - 0 Isolation Patient Management []  - 0 Hearing / Language / Visual special needs []  - 0 Assessment of Community assistance (transportation, D/C planning, etc.) []  - 0 Additional assistance / Altered mentation []  - 0 Support Surface(s)  Assessment (bed, cushion, seat, etc.) INTERVENTIONS - Wound Cleansing / Measurement []  - 0 Wound Imaging (photographs - any number of wounds) []  - 0 Wound Tracing (instead of photographs) []  - 0 Simple Wound Measurement - one wound []  - 0 Complex Wound Measurement - multiple wounds []  - 0 Simple Wound Cleansing - one wound []  - 0  Complex Wound Cleansing - multiple wounds INTERVENTIONS - Wound Dressings []  - 0 Small Wound Dressing one or multiple wounds []  - 0 Medium Wound Dressing one or multiple wounds []  - 0 Large Wound Dressing one or multiple wounds []  - 0 Application of Medications - injection INTERVENTIONS - Miscellaneous []  - 0 External ear exam []  - 0 Specimen Collection (cultures, biopsies, blood, body fluids, etc.) []  - 0 Specimen(s) / Culture(s) sent or taken to Lab for analysis []  - 0 Patient Transfer (multiple staff / Harrel Lemon Lift / Similar devices) []  - 0 Simple Staple / Suture removal (25 or less) []  - 0 Complex Staple / Suture removal (26 or more) []  - 0 Hypo / Hyperglycemic Management (close monitor of Blood Glucose) []  - 0 Ankle / Brachial Index (ABI) - do not check if billed separately Has the patient been seen at the hospital within the last three years: Yes Total Score: 80 Level Of Care: New/Established - Level 3 Electronic Signature(s) Signed: 08/11/2020 4:43:39 PM By: [name] Entered By: [name] on 08/11/2020 14:12:02 -------------------------------------------------------------------------------- [place] Details Patient Name: Date of Service: [name], Sheryn Bison NA D. 08/11/2020 1:15 PM Medical Record Number: [id number] Patient Account Number: [id number] Date of Birth/Sex: Treating RN: 16-Oct-1960 (60 y.o. 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 of confinement associated with the hyperbaric oxygen chamber Potential for barotraumas to ears, sinuses, teeth, and lungs or cerebral gas embolism related to changes in atmospheric pressure inside hyperbaric oxygen chamber Goals: Barotrauma will be prevented during HBO2 Date Initiated: 08/11/2020 Target Resolution Date: 09/22/2020 Goal Status: Active Patient and/or family will be able to state/discuss factors appropriate to the management of their disease process during treatment Date Initiated: 08/11/2020 Target Resolution Date: 09/16/2020 Goal Status: Active Patient/caregiver will verbalize understanding of HBO goals, rationale, procedures and potential hazards Date Initiated: 08/11/2020 Target Resolution Date: 08/26/2020 Goal Status: Active Interventions: Assess and provide for patients comfort related to the hyperbaric environment and equalization of middle ear Assess for signs and symptoms related to adverse events, including but not limited to confinement anxiety, pneumothorax, oxygen toxicity and baurotrauma Assess patient for any history of confinement anxiety Notes: Orientation to the Wound Care Program Nursing Diagnoses: Knowledge deficit related to the wound healing center program Goals: Patient/caregiver will verbalize understanding of the [place] Date Initiated: 08/11/2020 Target Resolution Date: 08/26/2020 Goal Status: Active Interventions: Provide education on orientation to the wound center Notes: Electronic Signature(s) Signed: 08/11/2020 4:43:39 PM By: [name] Entered By: [name] on 08/11/2020 13:13:05 -------------------------------------------------------------------------------- Pain Assessment Details Patient Name: Date of Service: CLAUDIE, [place]ICK[place]SE [name] 08/11/2020 1:15 PM Medical Record Number: [id number] Patient Account Number: [id number] Date of Birth/Sex: Treating RN: 01-13-1961 (60 y.o.  Female) Levan Hurst Primary Care Simmie Garin: [name] Other Clinician: Referring Delina Kruczek: Treating Terryl Niziolek/Extender: Rosana Hoes, [name] in Treatment: 0 Active Problems Location of Pain Severity and Description of Pain Patient Has Paino No Site Locations Pain Management and Medication Current Pain Management: Electronic Signature(s) Signed: 08/11/2020 5:04:27 PM By: Levan Hurst RN, BSN Entered By: Levan Hurst on 08/11/2020 13:47:52 -------------------------------------------------------------------------------- Patient/Caregiver Education Details Patient Name: Date of Service: Jari Sportsman 3/24/2022andnbsp1:15 PM Medical Record Number: [id number] Patient Account Number: [id number] Date of Birth/Gender: Treating RN: [birth year] (60 y.o. Female) [name] Primary Care Physician: [name] Other Clinician: Referring Physician: Treating Physician/Extender: Dellia Nims  Lyanne Co, Richard Weeks in Treatment: 0 Education Assessment Education Provided To: Patient Education Topics Provided Hyperbaric Oxygenation: Handouts: Hyperbaric Oxygen Methods: Explain/Verbal Responses: Reinforcements needed, State content correctly [place]: o Handouts: Welcome T [facility] o Methods: Explain/Verbal Responses: Reinforcements needed, State content correctly Electronic Signature(s) Signed: 08/11/2020 4:43:39 PM By: [name] Entered By: [name] on 08/11/2020 13:13:26 -------------------------------------------------------------------------------- Vitals Details Patient Name: Date of Service: [name], [name] 08/11/2020 1:15 PM Medical Record Number: [id number] Patient Account Number: [id number] Date of Birth/Sex: Treating RN: 12-Jan-1961 (60 y.o. Female) Levan Hurst Primary Care Dorismar Chay: [name] Other Clinician: Referring Ajanee Buren: Treating Peter Keyworth/Extender: Rosana Hoes, [name] in Treatment: 0 Vital Signs Time Taken: 13:31 Temperature (F): 97.6 Height (in): 65 Pulse (bpm): 98 Source: Stated Respiratory Rate (breaths/min): 16 Weight (lbs): 175 Blood Pressure (mmHg): 148/83 Source: Stated Reference Range: 80 - 120 mg / dl Body Mass Index (BMI): 29.1 Electronic Signature(s) Signed: 08/11/2020 5:04:27 PM By: Levan Hurst RN, BSN Entered By: Levan Hurst on 08/11/2020 13:32:37

## 2020-08-11 NOTE — Progress Notes (Signed)
Nancy [name], Nancy [name] ([id number]) Visit Report for 08/11/2020 Chief Complaint Document Details Patient Name: Date of Service: Nancy, [name] 08/11/2020 1:15 PM Medical Record Number: [id number] Patient Account Number: [id number] Date of Birth/Sex: Treating RN: [birth year] (60 y.o. Female) Nancy [name] Primary Care Provider: Larene [name] Other Clinician: Referring Provider: Treating Provider/Extender: Rosana Hoes, [name] in Treatment: 0 Information Obtained from: Patient Chief Complaint Rate and3/24/2022; patient is here for consideration of hyperbaric oxygen therapy osteoradionecrosis of the mandible Electronic Signature(s) Signed: 08/11/2020 4:40:49 PM By: Linton Ham MD Entered By: Linton Ham on 08/11/2020 14:31:28 -------------------------------------------------------------------------------- HPI Details Patient Name: Date of Service: Nancy [name] D. 08/11/2020 1:15 PM Medical Record Number: [id number] Patient Account Number: [id number] Date of Birth/Sex: Treating RN: 29-Jul-1960 (60 y.o. Female) Nancy [name] Primary Care Provider: Larene [name] Other Clinician: Referring Provider: Treating Provider/Extender: Rosana Hoes, [name] in Treatment: 0 History of Present Illness HPI Description: ADMISSION 08/11/2020. 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 She was treated with chemotherapy and 30 treatments of radiation from 05/23/2017 through 06/17/2017 with 60 cGy. She also had a right hemiglossectomy and a flap closure from her fibula. Her adjunct of chemotherapy was done before her surgery. The patient did well up until late last year she tells me. She noted an area of exposed bone that gradually worsened but she really did not seek medical attention until February of this year. She also noted persistent dental infection on  the right mandible. The patient underwent a biopsy that showed reactive gingival squamous epithelium with acute inflammation and dense submucosal chronic inflammation with focal intrabone fragments and a few associated bacteria. No malignancy was identified. She currently has exposure of the dental roots and on their lying mandibular bone anteriorly on the right. 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 Her biopsy as noted was negative. She is referred here for hyperbaric oxygen. Past medical history includes bipolar disorder, cervical cancer, depression, GERD, hypertension, hypothyroidism and squamous cell carcinoma of the tongue as quoted above. 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 08/11/2020 1:15 PM Medical Record Number: [id number] Patient Account Number: [id number] Date of Birth/Sex: Treating RN: 1960/10/09 (60 y.o. Female) Nancy [name] Primary Care Provider: Larene [name] Other Clinician: Referring Provider: Treating Provider/Extender: Rosana Hoes, [name] in Treatment: 0 Constitutional Patient is hypertensive.. Pulse regular and within target range for patient.Marland Kitchen Respirations regular, non-labored and within target range.. Temperature is normal and within the target range for the patient.Marland Kitchen Appears in no distress. Ears, Nose, Mouth, and Throat She has an extensive graft on the right side of her tongue adherent to the underlying buccal mucosa. The area of bone exposure is in the inferior incisors. There is no evidence of infection. Respiratory work of breathing is normal. Bilateral breath sounds are clear and equal in all lobes with no  wheezes, rales or rhonchi.. Cardiovascular Soft systolic murmur. Electronic Signature(s) Signed: 08/11/2020 4:40:49 PM By: Linton Ham MD Entered By: Linton Ham on 08/11/2020 14:36:03 -------------------------------------------------------------------------------- Physician Orders Details Patient Name: Date of Service: Nancy [name] D. 08/11/2020 1:15 PM Medical Record Number: [id number] Patient Account Number: [id number] Date of Birth/Sex: Treating RN: 1960/05/22 (60  y.o. Female) Nancy [name], [place] Primary Care Provider: Larene [name] Other Clinician: Referring Provider: Treating Provider/Extender: Rosana Hoes, [name] in Treatment: 0 Verbal / Phone Orders: No Diagnosis Coding Follow-up Appointments ppointment in: - When call to start hyberbaric oxygen therapy once chest x-ray complete and insurance has approved. Return A Hyperbaric Oxygen Therapy Evaluate for HBO Therapy Indication: - Osteoradionecrosis of right side of tongue and jaw If appropriate for treatment, begin HBOT per protocol: 2.5 ATA for 90 Minutes with 2 Five (5) Minute A Breaks ir Total Number of Treatments: - 40 One treatments per day (delivered Monday through Friday unless otherwise specified in Special Instructions below): A frin (Oxymetazoline HCL) 0.05% nasal spray - 1 spray in both nostrils daily as needed prior to HBO treatment for difficulty clearing ears Radiology X-ray, Chest - chest x-ray 2 view related to hyberbaric protocol and hypertension. ICD 10 code I10 CPT code [id number] Electronic Signature(s) Signed: 08/11/2020 4:40:49 PM By: Linton Ham MD Signed: 08/11/2020 4:43:39 PM By: Nancy [name] Entered By: Nancy [name] on 08/11/2020 14:11:25 Prescription 08/11/2020 -------------------------------------------------------------------------------- Clarita Leber D. Linton Ham MD Patient Name: Provider: 05/16/1961 [id number] Date of Birth: NPI#: Female  [id number] Sex: DEA #: [phone] [id number] Phone #: License #: [place] Patient Address: [address] [place], [name] [address] [place], [name] [address] [phone] Allergies Iodinated Contrast Media; amoxicillin Provider's Orders X-ray, Chest - chest x-ray 2 view related to hyberbaric protocol and hypertension. ICD 10 code I10 CPT code [phone] Hand Signature: Date(s): Electronic Signature(s) Signed: 08/11/2020 4:40:49 PM By: Linton Ham MD Signed: 08/11/2020 4:43:39 PM By: Nancy [name] Entered By: Nancy [name] on 08/11/2020 14:11:25 -------------------------------------------------------------------------------- Problem List Details Patient Name: Date of Service: Nancy [name] D. 08/11/2020 1:15 PM Medical Record Number: [id number] Patient Account Number: [id number] Date of Birth/Sex: Treating RN: December 13, 1960 (60 y.o. Female) Nancy [name] Primary Care Provider: Larene [name] Other Clinician: Referring Provider: Treating Provider/Extender: Rosana Hoes, [name] in Treatment: 0 Active Problems ICD-10 Encounter Code Description Active Date MDM Diagnosis M27.2 Inflammatory conditions of jaws 08/11/2020 No Yes Z51.0 Encounter for antineoplastic radiation therapy 08/11/2020 No Yes L59.8 Other specified disorders of the skin and subcutaneous tissue related to 08/11/2020 No Yes radiation Inactive Problems Resolved Problems Electronic Signature(s) Signed: 08/11/2020 4:40:49 PM By: Linton Ham MD Entered By: Linton Ham on 08/11/2020 14:20:54 -------------------------------------------------------------------------------- Progress Note Details Patient Name: Date of Service: Nancy [name] D. 08/11/2020 1:15 PM Medical Record Number: [id number] Patient Account Number: [id number] Date of Birth/Sex: Treating RN: November 18, 1960 (60 y.o. Female) Nancy [name] Primary  Care Provider: Larene [name] Other Clinician: Referring Provider: Treating Provider/Extender: Rosana Hoes, [name] in Treatment: 0 Subjective Chief Complaint Information obtained from Patient Rate and3/24/2022; patient is here for consideration of hyperbaric oxygen therapy osteoradionecrosis of the mandible History of Present Illness (HPI) ADMISSION 08/11/2020. 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 She was treated with chemotherapy and 30 treatments of radiation from 05/23/2017 through 06/17/2017 with 60 cGy. She also had a right hemiglossectomy and a flap closure from her fibula. Her adjunct of chemotherapy was done before her surgery. The patient did well up until late last year she tells me. She noted an area of exposed bone that gradually worsened but she really did not seek medical attention until February of this year. She also noted  persistent dental infection on the right mandible. The patient underwent a biopsy that showed reactive gingival squamous epithelium with acute inflammation and dense submucosal chronic inflammation with focal intrabone fragments and a few associated bacteria. No malignancy was identified. She currently has exposure of the dental roots and on their lying mandibular bone anteriorly on the right. 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 Her biopsy as noted was negative. She is referred here for hyperbaric oxygen. Past medical history includes bipolar disorder, cervical cancer, depression, GERD, hypertension, hypothyroidism and squamous cell carcinoma of the tongue as quoted above. She does not have any significant cardiac or pulmonary issues she is not a smoker Patient History Information obtained from Patient. Allergies Iodinated Contrast Media, amoxicillin Family History Unknown History. Social History Never  smoker, Marital Status - Single, Alcohol Use - Never, Drug Use - No History, Caffeine Use - Rarely. Medical History Cardiovascular Patient has history of Hypertension Oncologic Patient has history of Received Chemotherapy - 6 treatments in 2018, Received Radiation - 30 treatments in 2019 Psychiatric Patient has history of Confinement Anxiety Medical A Surgical History Notes nd Cardiovascular Hyperlipidemia Gastrointestinal GERD Oncologic Tongue cancer 2018 Psychiatric Bipolar Disorder, Panic Disorder, OCD Review of Systems (ROS) Constitutional Symptoms (General Health) Denies complaints or symptoms of Fatigue, Fever, Chills, Marked Weight Change. Eyes Complains or has symptoms of Glasses / Contacts. Ear/Nose/Mouth/Throat Denies complaints or symptoms of Chronic sinus problems or rhinitis. Respiratory Denies complaints or symptoms of Chronic or frequent coughs, Shortness of Breath. Endocrine Denies complaints or symptoms of Heat/cold intolerance. Genitourinary Denies complaints or symptoms of Frequent urination. Integumentary (Skin) Denies complaints or symptoms of Wounds. Musculoskeletal Denies complaints or symptoms of Muscle Pain, Muscle Weakness. Neurologic Denies complaints or symptoms of Numbness/parasthesias. Objective Constitutional Patient is hypertensive.. Pulse regular and within target range for patient.Marland Kitchen Respirations regular, non-labored and within target range.. Temperature is normal and within the target range for the patient.Marland Kitchen Appears in no distress. Vitals Time Taken: 1:31 PM, Height: 65 in, Source: Stated, Weight: 175 lbs, Source: Stated, BMI: 29.1, Temperature: 97.6 F, Pulse: 98 bpm, Respiratory Rate: 16 breaths/min, Blood Pressure: 148/83 mmHg. Ears, Nose, Mouth, and Throat She has an extensive graft on the right side of her tongue adherent to the underlying buccal mucosa. The area of bone exposure is in the inferior incisors. There is no evidence of  infection. Respiratory work of breathing is normal. Bilateral breath sounds are clear and equal in all lobes with no wheezes, rales or rhonchi.. Cardiovascular Soft systolic murmur. Assessment Active Problems ICD-10 Inflammatory conditions of jaws Encounter for antineoplastic radiation therapy Other specified disorders of the skin and subcutaneous tissue related to radiation Plan Follow-up Appointments: Return Appointment in: - When call to start hyberbaric oxygen therapy once chest x-ray complete and insurance has approved. Hyperbaric Oxygen Therapy: Evaluate for HBO Therapy Indication: - Osteoradionecrosis of right side of tongue and jaw If appropriate for treatment, begin HBOT per protocol: 2.5 ATA for 90 Minutes with 2 Five (5) Minute Air Breaks T Number of Treatments: - 40 otal One treatments per day (delivered Monday through Friday unless otherwise specified in Special Instructions below): Afrin (Oxymetazoline HCL) 0.05% nasal spray - 1 spray in both nostrils daily as needed prior to HBO treatment for difficulty clearing ears Radiology ordered were: X-ray, Chest - chest x-ray 2 view related to hyberbaric protocol and hypertension. ICD 10 code [place] CPT code 70350 1. This patient has osteoradionecrosis of the jaw as documented in HPI. 2. She has no current  plans for surgery that she is aware of but she is followed by Dr. [place] Callas of ENT This is at [facility] . 3. She will be treated with 2.5 atm for 90 minutes with two 5-minute air breaks. Goal of therapy will be to help stabilize underlying gingiva Mucosa as well as underlying mandible. This may be as a precursor for surgery 4. PA and lateral chest x-ray ordered Electronic Signature(s) Signed: 08/11/2020 4:40:49 PM By: Linton Ham MD Entered By: Linton Ham on 08/11/2020 14:38:06 -------------------------------------------------------------------------------- HxROS Details Patient Name: Date of Service: Nancy Hedge  [name] D. 08/11/2020 1:15 PM Medical Record Number: [id number] Patient Account Number: [id number] Date of Birth/Sex: Treating RN: 08/05/1960 (60 y.o. Female) Levan Hurst Primary Care Provider: Larene [name] Other Clinician: Referring Provider: Treating Provider/Extender: Cleophas Dunker in Treatment: 0 Information Obtained From Patient Constitutional Symptoms (General Health) Complaints and Symptoms: Negative for: Fatigue; Fever; Chills; Marked Weight Change Eyes Complaints and Symptoms: Positive for: Glasses / Contacts Ear/Nose/Mouth/Throat Complaints and Symptoms: Negative for: Chronic sinus problems or rhinitis Respiratory Complaints and Symptoms: Negative for: Chronic or frequent coughs; Shortness of Breath Endocrine Complaints and Symptoms: Negative for: Heat/cold intolerance Genitourinary Complaints and Symptoms: Negative for: Frequent urination Integumentary (Skin) Complaints and Symptoms: Negative for: Wounds Musculoskeletal Complaints and Symptoms: Negative for: Muscle Pain; Muscle Weakness Neurologic Complaints and Symptoms: Negative for: Numbness/parasthesias Hematologic/Lymphatic Cardiovascular Medical History: Positive for: Hypertension Past Medical History Notes: Hyperlipidemia Gastrointestinal Medical History: Past Medical History Notes: GERD Immunological Oncologic Medical History: Positive for: Received Chemotherapy - 6 treatments in 2018; Received Radiation - 30 treatments in 2019 Past Medical History Notes: Tongue cancer 2018 Psychiatric Medical History: Positive for: Confinement Anxiety Past Medical History Notes: Bipolar Disorder, Panic Disorder, OCD Immunizations Pneumococcal Vaccine: Received Pneumococcal Vaccination: Yes Implantable Devices None Family and Social History Unknown History: Yes; Never smoker; Marital Status - Single; Alcohol Use: Never; Drug Use: No History; Caffeine Use: Rarely; Financial  Concerns: No; Food, Clothing or Shelter Needs: No; Support System Lacking: No; Transportation Concerns: No Engineer, maintenance) Signed: 08/11/2020 4:40:49 PM By: Linton Ham MD Signed: 08/11/2020 5:04:27 PM By: Levan Hurst RN, BSN Entered By: Levan Hurst on 08/11/2020 13:41:40 -------------------------------------------------------------------------------- [place] Details Patient Name: Date of Service: Nancy [name] D. 08/11/2020 Medical Record Number: [id number] Patient Account Number: [id number] Date of Birth/Sex: Treating RN: October 24, 1960 (60 y.o. Female) 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 Facility Procedures CPT4 Code: [id number] Description: [place] VISIT-LEV 3 EST PT Modifier: Quantity: 1 Physician Procedures Electronic Signature(s) Signed: 08/11/2020 4:40:49 PM By: Linton Ham MD Entered By: Linton Ham on 08/11/2020 14:38:29

## 2020-08-11 NOTE — Progress Notes (Signed)
Nancy [name] ([id number]) Visit Report for 08/11/2020 Abuse/Suicide Risk Screen Details Patient Name: Date of Service: Nancy [name], Nancy [name] [name] 08/11/2020 1:15 PM Medical Record Number: [id number] Patient Account Number: [id number] Date of Birth/Sex: Treating RN: Oct 19, 1960 (60 y.o. Female) Levan Hurst Primary Care Rasmus Preusser: [name] Other Clinician: Referring Aaliyha Mumford: Treating Myriam Brandhorst/Extender: Rosana Hoes, [name] in Treatment: 0 Abuse/Suicide Risk Screen Items Answer ABUSE RISK SCREEN: Has anyone close to you tried to hurt or harm you recentlyo No Do you feel uncomfortable with anyone in your familyo No Has anyone forced you do things that you didnt want to doo No Electronic Signature(s) Signed: 08/11/2020 5:04:27 PM By: Levan Hurst RN, BSN Entered By: Levan Hurst on 08/11/2020 13:46:27 -------------------------------------------------------------------------------- Activities of Daily Living Details Patient Name: Date of Service: Nancy [name] [name] 08/11/2020 1:15 PM Medical Record Number: [id number] Patient Account Number: [id number] Date of Birth/Sex: Treating RN: 1961/04/16 (60 y.o. Female) Levan Hurst Primary Care Vaeda Westall: [name] Other Clinician: Referring Kwamaine Cuppett: Treating Idamae Coccia/Extender: Rosana Hoes, [name] in Treatment: 0 Activities of Daily Living Items Answer Activities of Daily Living (Please select one for each item) Drive Automobile Completely Able T Medications ake Completely Able Use T elephone Completely Able Care for Appearance Completely Able Use T oilet Completely Able Bath / Shower Completely Able Dress Self Completely Able Feed Self Completely Able Walk Completely Able Get In / Out Bed Completely Able Housework Completely Able Prepare Meals Completely [place] for Self Completely Able Electronic Signature(s) Signed: 08/11/2020 5:04:27 PM By: Levan Hurst RN, BSN Entered By: Levan Hurst on 08/11/2020 13:46:51 -------------------------------------------------------------------------------- Education Screening Details Patient Name: Date of Service: Nancy [name] [place] D. 08/11/2020 1:15 PM Medical Record Number: [id number] Patient Account Number: [id number] Date of Birth/Sex: Treating RN: [birth year] (60 y.o. Female) Levan Hurst Primary Care Kerrigan Glendening: [name] Other Clinician: Referring Ryin Schillo: Treating Khya Halls/Extender: Cleophas Dunker in Treatment: 0 Primary Learner Assessed: Patient Learning Preferences/Education Level/Primary Language Learning Preference: Explanation, Demonstration, Printed Material Highest Education Level: High School Preferred Language: English Cognitive Barrier Language Barrier: No Translator Needed: No Memory Deficit: No Emotional Barrier: No Cultural/Religious Beliefs Affecting Medical Care: No Physical Barrier Impaired Vision: No Impaired Hearing: No Decreased Hand dexterity: No Knowledge/Comprehension Knowledge Level: High Comprehension Level: High Ability to understand written instructions: High Ability to understand verbal instructions: High Motivation Anxiety Level: Calm Cooperation: Cooperative Education Importance: Acknowledges Need Interest in Health Problems: Asks Questions Perception: Coherent Willingness to Engage in Self-Management High Activities: Readiness to Engage in Self-Management High Activities: Electronic Signature(s) Signed: 08/11/2020 5:04:27 PM By: Levan Hurst RN, BSN Entered By: Levan Hurst on 08/11/2020 13:47:23 -------------------------------------------------------------------------------- Fall Risk Assessment Details Patient Name: Date of Service: Nancy [name], [name] 08/11/2020 1:15 PM Medical Record Number: [id number] Patient Account Number: [id number] Date of Birth/Sex: Treating RN: 10/13/1960 (60 y.o. Female)  Levan Hurst Primary Care Aarika Moon: [name] Other Clinician: Referring Sholonda Jobst: Treating Kailana Benninger/Extender: Rosana Hoes, [name] in Treatment: 0 Fall Risk Assessment Items Have you had 2 or more falls in the last 12 monthso 0 No Have you had any fall that resulted in injury in the last 12 monthso 0 No FALLS RISK SCREEN History of falling - immediate or within 3 months 0 No Secondary diagnosis (Do you have 2 or more medical diagnoseso) 15 Yes Ambulatory aid None/bed rest/wheelchair/nurse 0 Yes Crutches/cane/walker 0 No Furniture 0 No Intravenous therapy Access/Saline/Heparin Lock 0 No Gait/Transferring Normal/ bed rest/ wheelchair 0 Yes Weak (short steps with  or without shuffle, stooped but able to lift head while walking, may seek 0 No support from furniture) Impaired (short steps with shuffle, may have difficulty arising from chair, head down, impaired 0 No balance) Mental Status Oriented to own ability 0 Yes Electronic Signature(s) Signed: 08/11/2020 5:04:27 PM By: Levan Hurst RN, BSN Entered By: Levan Hurst on 08/11/2020 13:47:36 -------------------------------------------------------------------------------- Nutrition Risk Screening Details Patient Name: Date of Service: Nancy [name] NA D. 08/11/2020 1:15 PM Medical Record Number: [id number] Patient Account Number: [id number] Date of Birth/Sex: Treating RN: 03-Jan-1961 (60 y.o. Female) Levan Hurst Primary Care Niajah Sipos: [name] Other Clinician: Referring Vincenzo Stave: Treating Bodie Abernethy/Extender: Rosana Hoes, [name] in Treatment: 0 Height (in): 65 Weight (lbs): 175 Body Mass Index (BMI): 29.1 Nutrition Risk Screening Items Score Screening NUTRITION RISK SCREEN: I have an illness or condition that made me change the kind and/or amount of food I eat 0 No I eat fewer than two meals per day 0 No I eat few fruits and vegetables, or milk products 0 No I have  three or more drinks of beer, liquor or wine almost every day 0 No I have tooth or mouth problems that make it hard for me to eat 0 No I don't always have enough money to buy the food I need 0 No I eat alone most of the time 0 No I take three or more different prescribed or over-the-counter drugs a day 1 Yes Without wanting to, I have lost or gained 10 pounds in the last six months 0 No I am not always physically able to shop, cook and/or feed myself 0 No Nutrition Protocols Good Risk Protocol Moderate Risk Protocol 0 Provide education on nutrition High Risk Proctocol Risk Level: Good Risk Score: 1 Electronic Signature(s) Signed: 08/11/2020 5:04:27 PM By: Levan Hurst RN, BSN Entered By: Levan Hurst on 08/11/2020 13:47:45

## 2020-08-12 ENCOUNTER — Other Ambulatory Visit: Payer: Self-pay

## 2020-08-12 ENCOUNTER — Ambulatory Visit (HOSPITAL_COMMUNITY)
Admission: RE | Admit: 2020-08-12 | Discharge: 2020-08-12 | Disposition: A | Discharge status: Home / Self Care | Payer: Medicare Other | Source: Ambulatory Visit | Attending: Internal Medicine | Admitting: Internal Medicine

## 2020-08-12 ENCOUNTER — Other Ambulatory Visit (HOSPITAL_COMMUNITY): Payer: Self-pay | Admitting: Internal Medicine

## 2020-08-12 DIAGNOSIS — Z9289 Personal history of other medical treatment: Secondary | ICD-10-CM

## 2020-08-12 DIAGNOSIS — I10 Essential (primary) hypertension: Secondary | ICD-10-CM | POA: Diagnosis present

## 2020-08-15 ENCOUNTER — Other Ambulatory Visit: Payer: Self-pay

## 2020-08-15 ENCOUNTER — Encounter (HOSPITAL_BASED_OUTPATIENT_CLINIC_OR_DEPARTMENT_OTHER): Payer: Medicare Other | Admitting: Internal Medicine

## 2020-08-15 DIAGNOSIS — M272 Inflammatory conditions of jaws: Secondary | ICD-10-CM | POA: Diagnosis not present

## 2020-08-15 NOTE — Progress Notes (Signed)
[name], [name] ([id number]) Visit Report for 08/15/2020 SuperBill Details Patient Name: Date of Service: Nancy [name], Nancy [name] 08/15/2020 Medical Record Number: [id number] Patient Account Number: [id number] Date of Birth/Sex: Treating RN: 04-Feb-1961 (60 y.o. 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 Facility Procedures CPT4 Code Description Modifier Quantity [id number] G0277-(Facility Use Only) HBOT full body chamber, 70min , 4 Physician Procedures Quantity CPT4 Code Description Modifier [id number] - WC PHYS HYPERBARIC OXYGEN THERAPY 1 ICD-10 Diagnosis Description Z51.0 Encounter for antineoplastic radiation therapy 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

## 2020-08-15 NOTE — Progress Notes (Signed)
Nancy [name], Nancy [name] ([id number]) Visit Report for 08/15/2020 HBO Details Patient Name: Date of Service: [place], [name] 08/15/2020 8:00 A M Medical Record Number: [id number] Patient Account Number: [id number] Date of Birth/Sex: Treating RN: May 21, 1961 (60 y.o. 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 HBO Treatment Details Treatment Number: 1 Patient Type: Outpatient Chamber Type: Monoplace Chamber Serial #: [id number] Treatment Protocol: 2.5 ATA with 90 minutes oxygen, with two 5 minute air breaks Treatment Details Compression Rate Down: 1.0 psi / minute De-Compression Rate Up: 1.5 psi / minute A breaks and breathing ir Compress Tx Pressure periods Decompress Decompress Begins Reached (leave unused spaces Begins Ends blank) Chamber Pressure (ATA 1 2.5 2.5 2.5 2.5 2.5 - - 2.5 1 ) Clock Time (24 hr) 08:19 08:42 09:12 09:17 09:47 09:52 - - 10:22 10:32 Treatment Length: 133 (minutes) Treatment Segments: 4 Vital Signs Capillary Blood Glucose Reference Range: 80 - 120 mg / dl HBO Diabetic Blood Glucose Intervention Range: <131 mg/dl or >249 mg/dl Time Vitals Blood Respiratory Capillary Blood Glucose Pulse Action Type: Pulse: Temperature: Taken: Pressure: Rate: Glucose (mg/dl): Meter #: Oximetry (%) Taken: Pre 07:55 158/89 65 20 98.1 Post 10:35 144/99 61 20 98.1 Treatment Response Treatment Toleration: Well Treatment Completion Status: Treatment Completed without Adverse Event Reagen Haberman Notes No concerns with treatment given. Patient's first treatment. Respiratory and cardiac exams were normal. 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 A trained emergency response team is readily available per Yes hospital policies and procedures. Continue HBOT as ordered. Yes Electronic Signature(s) Signed: 08/15/2020 5:22:38 PM By: Linton Ham MD Entered By: Linton Ham on 08/15/2020 16:03:50 -------------------------------------------------------------------------------- HBO Safety Checklist Details Patient Name: Date of Service: Nancy [name] NA D. 08/15/2020 8:00 A M Medical Record Number: [id number] Patient Account Number: [id number] Date of Birth/Sex: Treating RN: May 12, 1961 (60 y.o. Nancy [name], [name] Primary Care Estee Yohe: [name] Other Clinician: Referring Gustavo Meditz: Treating Evann Erazo/Extender: Rosana Hoes, [name] in Treatment: 0 HBO Safety Checklist Items Safety Checklist Consent Form Signed Patient voided / foley secured and emptied When did you last eato last night Last dose of injectable or oral agent n/a Ostomy pouch emptied and vented if applicable NA All implantable devices assessed, documented and approved Intravenous access site secured and place NA Valuables secured Linens and cotton and cotton/polyester blend (less than 51% polyester) Personal oil-based products / skin lotions / body lotions removed NA Wigs or hairpieces removed NA Smoking or tobacco materials removed NA Books / newspapers / magazines / loose paper removed NA Cologne, aftershave, perfume and deodorant removed NA Jewelry removed (may wrap wedding band) Make-up removed NA Hair care products removed NA Battery operated devices (external) removed NA Heating patches and chemical warmers removed NA Titanium eyewear removed NA Nail polish cured greater than 10 hours NA Casting material cured greater than 10  hours NA Hearing aids removed NA Loose dentures or partials removed NA Prosthetics have been removed NA Patient demonstrates correct use of air break device (if applicable) Patient concerns have been addressed Patient grounding bracelet on and cord attached to chamber Specifics for Inpatients (complete in addition  to above) Medication sheet sent with patient Intravenous medications needed or due during therapy sent with patient Drainage tubes (e.g. nasogastric tube or chest tube secured and vented) Endotracheal or Tracheotomy tube secured Cuff deflated of air and inflated with saline Airway suctioned Electronic Signature(s) Signed: 08/15/2020 5:16:32 PM By: [name] Entered By: [name] on 08/15/2020 09:07:53

## 2020-08-15 NOTE — Progress Notes (Signed)
LAZARA, GRIESER ([id number]) Visit Report for 08/15/2020 Arrival Information Details Patient Name: Date of Service: [place], [name] 08/15/2020 8:00 A M Medical Record Number: [id number] Patient Account Number: [id number] Date of Birth/Sex: Treating RN: 1961/03/15 (60 y.o. [name], [name] Primary Care Stephaine Breshears: [name] Other Clinician: Referring Catarina Huntley: Treating Jenai Scaletta/Extender: Rosana Hoes, [name] in Treatment: 0 Visit Information History Since Last Visit Added or deleted any medications: No Patient Arrived: Ambulatory Any new allergies or adverse reactions: No Arrival Time: 07:55 Had a fall or experienced change in No Accompanied By: self activities of daily living that may affect Transfer Assistance: None risk of falls: Patient Identification Verified: Yes Signs or symptoms of abuse/neglect since last visito No Secondary Verification Process Completed: Yes Hospitalized since last visit: No Patient Requires Transmission-Based Precautions: No Implantable device outside of the clinic excluding No Patient Has Alerts: No cellular tissue based products placed in the center since last visit: Pain Present Now: No Electronic Signature(s) Signed: 08/15/2020 5:16:32 PM By: [name] Entered By: [name] on 08/15/2020 09:06:57 -------------------------------------------------------------------------------- Encounter Discharge Information Details Patient Name: Date of Service: [name] 08/15/2020 8:00 A M Medical Record Number: [id number] Patient Account Number: [id number] Date of Birth/Sex: Treating RN: 1960/06/30 (60 y.o. [name] Primary Care Seraphine Gudiel: [name] Other Clinician: Referring Nevaeha Finerty: Treating Teddi Badalamenti/Extender: Cleophas Dunker in Treatment: 0 Encounter Discharge Information Items Discharge Condition: Stable Ambulatory Status: Ambulatory Discharge Destination:  Home Transportation: Private Auto Accompanied By: self Schedule Follow-up Appointment: Yes Clinical Summary of Care: Electronic Signature(s) Signed: 08/15/2020 5:16:32 PM By: [name] Entered By: [name] on 08/15/2020 10:57:56 -------------------------------------------------------------------------------- Vitals Details Patient Name: Date of Service: [name], [name] 08/15/2020 8:00 A M Medical Record Number: [id number] Patient Account Number: [id number] Date of Birth/Sex: Treating RN: 05/31/1960 (60 y.o. [name], [name] Primary Care Ashante Yellin: [name] Other Clinician: Referring Ardene Remley: Treating Sugar Vanzandt/Extender: Rosana Hoes, [name] in Treatment: 0 Vital Signs Time Taken: 07:55 Temperature (F): 98.1 Height (in): 65 Pulse (bpm): 65 Weight (lbs): 175 Respiratory Rate (breaths/min): 20 Body Mass Index (BMI): 29.1 Blood Pressure (mmHg): 158/89 Reference Range: 80 - 120 mg / dl Electronic Signature(s) Signed: 08/15/2020 5:16:32 PM By: [name] Entered By: [name] on 08/15/2020 09:07:18

## 2020-08-16 ENCOUNTER — Other Ambulatory Visit: Payer: Self-pay

## 2020-08-16 ENCOUNTER — Encounter (HOSPITAL_BASED_OUTPATIENT_CLINIC_OR_DEPARTMENT_OTHER): Payer: Medicare Other | Admitting: Internal Medicine

## 2020-08-16 DIAGNOSIS — M272 Inflammatory conditions of jaws: Secondary | ICD-10-CM | POA: Diagnosis not present

## 2020-08-17 ENCOUNTER — Encounter (HOSPITAL_BASED_OUTPATIENT_CLINIC_OR_DEPARTMENT_OTHER): Payer: Medicare Other | Admitting: Physician Assistant

## 2020-08-17 ENCOUNTER — Other Ambulatory Visit: Payer: Self-pay

## 2020-08-17 DIAGNOSIS — M272 Inflammatory conditions of jaws: Secondary | ICD-10-CM | POA: Diagnosis not present

## 2020-08-17 NOTE — Progress Notes (Signed)
Nancy [name], Nancy [name] ([id number]) Visit Report for 08/17/2020 HBO Details Patient Name: Date of Service: [name], [name] 08/17/2020 8:00 A M Medical Record Number: [id number] Patient Account Number: [id number] Date of Birth/Sex: Treating RN: [birth year] (60 y.o. 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 HBO Treatment Details Treatment Number: 3 Patient Type: Outpatient Chamber Type: Monoplace Chamber Serial #: [id number] Treatment Protocol: 2.5 ATA with 90 minutes oxygen, with two 5 minute air breaks Treatment Details Compression Rate Down: 2.0 psi / minute De-Compression Rate Up: 2.0 psi / minute A breaks and breathing ir Compress Tx Pressure periods Decompress Decompress Begins Reached (leave unused spaces Begins Ends blank) Chamber Pressure (ATA 1 2.5 2.5 2.5 2.5 2.5 - - 2.5 1 ) Clock Time (24 hr) 08:13 08:27 08:57 09:02 09:32 09:37 - - 10:07 10:17 Treatment Length: 124 (minutes) Treatment Segments: 4 Vital Signs Capillary Blood Glucose Reference Range: 80 - 120 mg / dl HBO Diabetic Blood Glucose Intervention Range: <131 mg/dl or >249 mg/dl Time Vitals Blood Respiratory Capillary Blood Glucose Pulse Action Type: Pulse: Temperature: Taken: Pressure: Rate: Glucose (mg/dl): Meter #: Oximetry (%) Taken: Pre 07:48 140/90 68 18 98.1 Post 10:18 139/94 66 16 98.4 Treatment Response Treatment Toleration: Well Treatment Completion Status: Treatment Completed without Adverse Event Electronic Signature(s) Signed: 08/17/2020 5:20:30 PM By: Worthy Keeler PA-C Signed: 08/17/2020 5:51:12 PM By: [name] Entered By: [name] on  08/17/2020 10:29:40 -------------------------------------------------------------------------------- HBO Safety Checklist Details Patient Name: Date of Service: Nancy [name] NA D. 08/17/2020 8:00 A M Medical Record Number: [id number] Patient Account Number: [id number] Date of Birth/Sex: Treating RN: [birth year] (60 y.o. Nancy [name], [name] Primary Care Domanique Huesman: Nancy [name] Other Clinician: Referring Jasmond River: Treating Chelcy Bolda/Extender: [name], Richard Weeks in Treatment: 0 HBO Safety Checklist Items Safety Checklist Consent Form Signed Patient voided / foley secured and emptied When did you last eato last night Last dose of injectable or oral agent n/a Ostomy pouch emptied and vented if applicable NA All implantable devices assessed, documented and approved Intravenous access site secured and place NA Valuables secured Linens and cotton and cotton/polyester blend (less than 51% polyester) Personal oil-based products / skin lotions / body lotions removed NA Wigs or hairpieces removed NA Smoking or tobacco materials removed NA Books / newspapers / magazines / loose paper removed NA Cologne, aftershave, perfume and deodorant removed NA Jewelry removed (may wrap wedding band) Make-up removed NA Hair care products removed NA Battery operated devices (external) removed NA Heating patches and chemical warmers removed NA Titanium eyewear removed NA Nail polish cured greater than 10 hours NA Casting material cured greater than 10 hours NA Hearing aids removed NA Loose dentures or partials removed NA Prosthetics have been removed NA Patient demonstrates correct use of air break device (if applicable) Patient concerns have been addressed Patient grounding bracelet on and cord attached to chamber Specifics for Inpatients (complete in addition to above) Medication sheet sent with patient Intravenous medications needed or due during therapy sent with  patient Drainage tubes (e.g. nasogastric tube or chest tube secured and vented) Endotracheal or Tracheotomy tube secured Cuff deflated of air and inflated with saline Airway suctioned Electronic Signature(s) Signed: 08/17/2020 5:51:12 PM By: [name] Entered By: [name] on 08/17/2020  10:28:42 

## 2020-08-17 NOTE — Progress Notes (Signed)
Nancy [name], Nancy [name] ([id number]) Visit Report for 08/17/2020 SuperBill Details Patient Name: Date of Service: Nancy [name], Nancy [name] [name] 08/17/2020 Medical Record Number: [id number] Patient Account Number: [id number] Date of Birth/Sex: Treating RN: August 23, 1960 (60 y.o. 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 Facility Procedures CPT4 Code Description Modifier Quantity [id number] G0277-(Facility Use Only) HBOT full body chamber, 71min , 4 Physician Procedures Quantity CPT4 Code Description Modifier [id number] 57505 - WC PHYS HYPERBARIC OXYGEN THERAPY 1 ICD-10 Diagnosis Description 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

## 2020-08-17 NOTE — Progress Notes (Signed)
ROLANDO, WHITBY ([id number]) Visit Report for 08/17/2020 Arrival Information Details Patient Name: Date of Service: [place], [name] 08/17/2020 8:00 A M Medical Record Number: [id number] Patient Account Number: [id number] Date of Birth/Sex: Treating RN: 1960-08-11 (60 y.o. [name], [name] Primary Care Tanaisha Pittman: [name] Other Clinician: Referring Kimm Ungaro: Treating Anaeli Cornwall/Extender: [name], [name] in Treatment: 0 Visit Information History Since Last Visit Added or deleted any medications: No Patient Arrived: Ambulatory Any new allergies or adverse reactions: No Arrival Time: 07:48 Had a fall or experienced change in No Accompanied By: self activities of daily living that may affect Transfer Assistance: None risk of falls: Patient Identification Verified: Yes Signs or symptoms of abuse/neglect since last visito No Secondary Verification Process Completed: Yes Hospitalized since last visit: No Patient Requires Transmission-Based Precautions: No Implantable device outside of the clinic excluding No Patient Has Alerts: No cellular tissue based products placed in the center since last visit: Pain Present Now: No Electronic Signature(s) Signed: 08/17/2020 5:51:12 PM By: [name] Entered By: [name] on 08/17/2020 10:28:02 -------------------------------------------------------------------------------- Encounter Discharge Information Details Patient Name: Date of Service: [name] 08/17/2020 8:00 A M Medical Record Number: [id number] Patient Account Number: [id number] Date of Birth/Sex: Treating RN: November 30, 1960 (60 y.o. [name] Primary Care Deonta Bomberger: [name] Other Clinician: Referring Jada Fass: Treating Tabytha Gradillas/Extender: [name], [name] in Treatment: 0 Encounter Discharge Information Items Discharge Condition: Stable Ambulatory Status: Ambulatory Discharge Destination:  Home Transportation: Private Auto Accompanied By: self Schedule Follow-up Appointment: Yes Clinical Summary of Care: Electronic Signature(s) Signed: 08/17/2020 5:51:12 PM By: [name] Entered By: [name] on 08/17/2020 10:30:05 -------------------------------------------------------------------------------- Vitals Details Patient Name: Date of Service: [name], [name] 08/17/2020 8:00 A M Medical Record Number: [id number] Patient Account Number: [id number] Date of Birth/Sex: Treating RN: 1961/04/20 (60 y.o. [name], [name] Primary Care Jazlynne Milliner: [name] Other Clinician: Referring Jaimie Redditt: Treating Molli Gethers/Extender: [name], Richard Weeks in Treatment: 0 Vital Signs Time Taken: 07:48 Temperature (F): 98.1 Height (in): 65 Pulse (bpm): 68 Weight (lbs): 175 Respiratory Rate (breaths/min): 18 Body Mass Index (BMI): 29.1 Blood Pressure (mmHg): 140/90 Reference Range: 80 - 120 mg / dl Electronic Signature(s) Signed: 08/17/2020 5:51:12 PM By: [name] Entered By: [name] on 08/17/2020 10:28:15

## 2020-08-18 ENCOUNTER — Other Ambulatory Visit: Payer: Self-pay

## 2020-08-18 ENCOUNTER — Encounter (HOSPITAL_BASED_OUTPATIENT_CLINIC_OR_DEPARTMENT_OTHER): Payer: Medicare Other | Admitting: Internal Medicine

## 2020-08-18 DIAGNOSIS — M272 Inflammatory conditions of jaws: Secondary | ICD-10-CM | POA: Diagnosis not present

## 2020-08-18 NOTE — Progress Notes (Signed)
Nancy, [name] ([id number]) Visit Report for [name] Arrival Information Details Patient Name: Date of Service: Nancy [name], Nancy [name] 9:30 A M Medical Record Number: [id number] Patient Account Number: [id number] Date of Birth/Sex: Treating RN: [birth year] (60 y.o. Nancy [name], Nancy [name] Primary Care Zigmund Linse: [name] Other Clinician: Referring Sukhdeep Wieting: Treating Maxey Ransom/Extender: Rosana Hoes, [name] in Treatment: 0 Visit Information History Since Last Visit Added or deleted any medications: No Patient Arrived: Ambulatory Any new allergies or adverse reactions: No Arrival Time: 09:38 Had a fall or experienced change in No Accompanied By: alone activities of daily living that may affect Transfer Assistance: None risk of falls: Patient Identification Verified: Yes Signs or symptoms of abuse/neglect since last visito No Secondary Verification Process Completed: Yes Hospitalized since last visit: No Patient Requires Transmission-Based Precautions: No Implantable device outside of the clinic excluding No Patient Has Alerts: No cellular tissue based products placed in the center since last visit: Pain Present Now: No Electronic Signature(s) Signed: 08/18/2020 5:46:28 PM By: Levan Hurst RN, BSN Entered By: Levan Hurst on 08/16/2020 10:55:29 -------------------------------------------------------------------------------- Encounter Discharge Information Details Patient Name: Date of Service: Nancy [name] NA D. [name] 9:30 A M Medical Record Number: [id number] Patient Account Number: [id number] Date of Birth/Sex: Treating RN: Jun 18, 1960 (60 y.o. Nancy [name] Primary Care Arnol Mcgibbon: [name] Other Clinician: Referring Devin Foskey: Treating Danaja Lasota/Extender: Cleophas Dunker in Treatment: 0 Encounter Discharge Information Items Discharge Condition: Stable Ambulatory Status: Ambulatory Discharge Destination:  Home Transportation: Private Auto Accompanied By: alone Schedule Follow-up Appointment: Yes Clinical Summary of Care: Patient Declined Electronic Signature(s) Signed: 08/18/2020 5:46:28 PM By: Levan Hurst RN, BSN Entered By: Levan Hurst on 08/16/2020 12:15:17 -------------------------------------------------------------------------------- Vitals Details Patient Name: Date of Service: Nancy [name] NA D. [name] 9:30 A M Medical Record Number: [id number] Patient Account Number: [id number] Date of Birth/Sex: Treating RN: 08/11/1960 (60 y.o. Nancy [name] Primary Care Ginger Leeth: [name] Other Clinician: Referring Shonice Wrisley: Treating Rafaella Kole/Extender: Rosana Hoes, [name] in Treatment: 0 Vital Signs Time Taken: 09:38 Temperature (F): 98.1 Height (in): 65 Pulse (bpm): 70 Weight (lbs): 175 Respiratory Rate (breaths/min): 18 Body Mass Index (BMI): 29.1 Blood Pressure (mmHg): 146/64 Reference Range: 80 - 120 mg / dl Electronic Signature(s) Signed: 08/18/2020 5:46:28 PM By: Levan Hurst RN, BSN Entered By: Levan Hurst on 08/16/2020 10:55:50

## 2020-08-18 NOTE — Progress Notes (Signed)
ASTER, SCREWS ([id number]) Visit Report for 08/18/2020 Arrival Information Details Patient Name: Date of Service: [place], [name] 08/18/2020 8:00 A M Medical Record Number: [id number] Patient Account Number: [id number] Date of Birth/Sex: Treating RN: [birth year] (60 y.o. Nancy Fetter Primary Care Alexei Doswell: [name] Other Clinician: Referring Berline Semrad: Treating Braxson Hollingsworth/Extender: Rosana Hoes, [name] in Treatment: 1 Visit Information History Since Last Visit Added or deleted any medications: No Patient Arrived: Ambulatory Any new allergies or adverse reactions: No Arrival Time: 07:55 Had a fall or experienced change in No Accompanied By: alone activities of daily living that may affect Transfer Assistance: None risk of falls: Patient Identification Verified: Yes Signs or symptoms of abuse/neglect since last visito No Secondary Verification Process Completed: Yes Hospitalized since last visit: No Patient Requires Transmission-Based Precautions: No Pain Present Now: No Patient Has Alerts: No Electronic Signature(s) Signed: 08/18/2020 5:46:28 PM By: Levan Hurst RN, BSN Entered By: Levan Hurst on 08/18/2020 10:22:47 -------------------------------------------------------------------------------- Encounter Discharge Information Details Patient Name: Date of Service: [name] 08/18/2020 8:00 A M Medical Record Number: [id number] Patient Account Number: [id number] Date of Birth/Sex: Treating RN: March 27, 1961 (60 y.o. Nancy Fetter Primary Care Dea Bitting: [name] Other Clinician: Referring Vonceil Upshur: Treating Conley Pawling/Extender: Cleophas Dunker in Treatment: 1 Encounter Discharge Information Items Discharge Condition: Stable Ambulatory Status: Ambulatory Discharge Destination: Home Transportation: Private Auto Accompanied By: alone Schedule Follow-up Appointment: Yes Clinical Summary of Care: Patient  Declined Electronic Signature(s) Signed: 08/18/2020 5:46:28 PM By: Levan Hurst RN, BSN Entered By: Levan Hurst on 08/18/2020 10:25:38 -------------------------------------------------------------------------------- Vitals Details Patient Name: Date of Service: [name], [name] 08/18/2020 8:00 A M Medical Record Number: [id number] Patient Account Number: [id number] Date of Birth/Sex: Treating RN: [birth year] (60 y.o. Nancy Fetter Primary Care Wessie Shanks: [name] Other Clinician: Referring Liliann File: Treating Hayli Milligan/Extender: Rosana Hoes, [name] in Treatment: 1 Vital Signs Time Taken: 07:55 Temperature (F): 97.7 Height (in): 65 Pulse (bpm): 70 Weight (lbs): 175 Respiratory Rate (breaths/min): 18 Body Mass Index (BMI): 29.1 Blood Pressure (mmHg): 161/83 Reference Range: 80 - 120 mg / dl Electronic Signature(s) Signed: 08/18/2020 5:46:28 PM By: Levan Hurst RN, BSN Entered By: Levan Hurst on 08/18/2020 10:23:13

## 2020-08-18 NOTE — Progress Notes (Signed)
Nancy [name], Nancy [name] ([id number]) Visit Report for 08/16/2020 SuperBill Details Patient Name: Date of Service: Nancy [name], Nancy [name] [name] 08/16/2020 Medical Record Number: [id number] Patient Account Number: [id number] Date of Birth/Sex: Treating RN: [birth year] (60 y.o. 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 Facility Procedures CPT4 Code Description Modifier Quantity 30092330 G0277-(Facility Use Only) HBOT full body chamber, 56min , 4 Physician Procedures Quantity CPT4 Code Description Modifier [id number] 33545 - WC PHYS HYPERBARIC OXYGEN THERAPY 1 ICD-10 Diagnosis Description 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

## 2020-08-18 NOTE — Progress Notes (Signed)
Nancy [name] ([id number]) Visit Report for 08/16/2020 HBO Details Patient Name: Date of Service: [place], [name] 08/16/2020 9:30 A M Medical Record Number: [id number] Patient Account Number: [id number] Date of Birth/Sex: Treating RN: 1960/10/12 (60 y.o. 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 HBO Treatment Details Treatment Number: 2 Patient Type: Outpatient Chamber Type: Monoplace Chamber Serial #: [id number] Treatment Protocol: 2.5 ATA with 90 minutes oxygen, with two 5 minute air breaks Treatment Details Compression Rate Down: 1.5 psi / minute De-Compression Rate Up: 2.0 psi / minute A breaks and breathing ir Compress Tx Pressure periods Decompress Decompress Begins Reached (leave unused spaces Begins Ends blank) Chamber Pressure (ATA 1 2.5 2.5 2.5 2.5 2.5 - - 2.5 1 ) Clock Time (24 hr) 09:47 10:02 10:32 10:37 11:07 11:13 - - 11:43 11:55 Treatment Length: 128 (minutes) Treatment Segments: 4 Vital Signs Capillary Blood Glucose Reference Range: 80 - 120 mg / dl HBO Diabetic Blood Glucose Intervention Range: <131 mg/dl or >249 mg/dl Time Vitals Blood Respiratory Capillary Blood Glucose Pulse Action Type: Pulse: Temperature: Taken: Pressure: Rate: Glucose (mg/dl): Meter #: Oximetry (%) Taken: Pre 09:38 146/64 70 18 98.1 Post 11:55 155/91 66 18 98.3 Treatment Response Treatment Completion Status: Treatment Completed without Adverse Event Tecora Eustache Notes No concerns with treatment given Physician HBO Attestation: I certify that I supervised this HBO treatment in accordance with Medicare guidelines. A trained emergency  response team is readily available per Yes hospital policies and procedures. Continue HBOT as ordered. Yes Electronic Signature(s) Signed: 08/18/2020 7:47:56 AM By: Linton Ham MD Entered By: Linton Ham on 08/16/2020 16:47:08 -------------------------------------------------------------------------------- HBO Safety Checklist Details Patient Name: Date of Service: Nancy [name] NA D. 08/16/2020 9:30 A M Medical Record Number: [id number] Patient Account Number: [id number] Date of Birth/Sex: Treating RN: 1960/12/16 (60 y.o. Nancy Fetter Primary Care Trentan Trippe: [name] Other Clinician: Referring Nani Ingram: Treating Riyanshi Wahab/Extender: Rosana Hoes, [name] in Treatment: 0 HBO Safety Checklist Items Safety Checklist Consent Form Signed Patient voided / foley secured and emptied When did you last eato 0830 Last dose of injectable or oral agent na Ostomy pouch emptied and vented if applicable NA All implantable devices assessed, documented and approved NA Intravenous access site secured and place NA Valuables secured Linens and cotton and cotton/polyester blend (less than 51% polyester) Personal oil-based products / skin lotions / body lotions removed Wigs or hairpieces removed Smoking or tobacco materials removed Books / newspapers / magazines / loose paper removed Cologne, aftershave, perfume and deodorant removed Jewelry removed (may wrap wedding band) Make-up removed Hair care products removed Battery operated devices (external) removed Heating patches and chemical warmers removed Titanium eyewear removed NA Nail polish cured greater than 10 hours Casting material cured greater than 10 hours NA Hearing aids removed NA Loose dentures or partials removed NA Prosthetics have been removed NA Patient demonstrates correct use of air break device (if applicable) Patient concerns have been addressed Patient grounding bracelet on and cord  attached to chamber Specifics for Inpatients (complete in addition to above) Medication sheet sent with patient NA Intravenous medications needed or due during therapy sent with patient NA Drainage tubes (e.g. nasogastric tube or chest tube secured and vented) NA Endotracheal or Tracheotomy tube secured NA Cuff deflated  of air and inflated with saline NA Airway suctioned NA Electronic Signature(s) Signed: 08/18/2020 5:46:28 PM By: Levan Hurst RN, BSN Entered By: Levan Hurst on 08/16/2020 10:56:31

## 2020-08-18 NOTE — Progress Notes (Signed)
JESSICAH, CROLL ([id number]) Visit Report for 08/18/2020 SuperBill Details Patient Name: Date of Service: Nancy [name], Nancy [name] [name] 08/18/2020 Medical Record Number: [id number] Patient Account Number: [id number] Date of Birth/Sex: Treating RN: 11-22-1960 (60 y.o. 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 Encounter for antineoplastic radiation therapy L59.8 Other specified disorders of the skin and subcutaneous tissue related to radiation Facility Procedures CPT4 Code Description Modifier Quantity [id number] G0277-(Facility Use Only) HBOT full body chamber, 56min , 4 Physician Procedures Quantity CPT4 Code Description Modifier [id number] 80063 - WC PHYS HYPERBARIC OXYGEN THERAPY 1 ICD-10 Diagnosis Description 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

## 2020-08-18 NOTE — Progress Notes (Signed)
GARNETTA, FEDRICK ([id number]) Visit Report for 08/18/2020 HBO Details Patient Name: Date of Service: [place], [name] 08/18/2020 8:00 A M Medical Record Number: [id number] Patient Account Number: [id number] Date of Birth/Sex: Treating RN: 10/17/1960 (60 y.o. 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 HBO Treatment Details Treatment Number: 4 Patient Type: Outpatient Chamber Type: Monoplace Chamber Serial #: [id number] Treatment Protocol: 2.5 ATA with 90 minutes oxygen, with two 5 minute air breaks Treatment Details Compression Rate Down: 2.0 psi / minute De-Compression Rate Up: 2.0 psi / minute A breaks and breathing ir Compress Tx Pressure periods Decompress Decompress Begins Reached (leave unused spaces Begins Ends blank) Chamber Pressure (ATA 1 2.5 2.5 2.5 2.5 2.5 - - 2.5 1 ) Clock Time (24 hr) 07:57 08:09 08:39 08:44 09:14 09:19 - - 09:49 10:01 Treatment Length: 124 (minutes) Treatment Segments: 4 Vital Signs Capillary Blood Glucose Reference Range: 80 - 120 mg / dl HBO Diabetic Blood Glucose Intervention Range: <131 mg/dl or >249 mg/dl Time Vitals Blood Respiratory Capillary Blood Glucose Pulse Action Type: Pulse: Temperature: Taken: Pressure: Rate: Glucose (mg/dl): Meter #: Oximetry (%) Taken: Pre 07:55 161/83 70 18 97.7 Post 10:02 171/94 64 18 98 Treatment Response Treatment Completion Status: Treatment Completed without Adverse Event Nusayba Cadenas Notes No concerns with treatment given Physician HBO Attestation: I certify that I supervised this HBO treatment in accordance with Medicare guidelines. A trained emergency  response team is readily available per Yes hospital policies and procedures. Continue HBOT as ordered. Yes Electronic Signature(s) Signed: 08/18/2020 5:43:02 PM By: Linton Ham MD Entered By: Linton Ham on 08/18/2020 17:42:08 -------------------------------------------------------------------------------- HBO Safety Checklist Details Patient Name: Date of Service: [name] 08/18/2020 8:00 A M Medical Record Number: [id number] Patient Account Number: [id number] Date of Birth/Sex: Treating RN: 04-10-1961 (60 y.o. Nancy Fetter Primary Care Kamerin Grumbine: [name] Other Clinician: Referring Abby Stines: Treating Rael Tilly/Extender: Rosana Hoes, [name] in Treatment: 1 HBO Safety Checklist Items Safety Checklist Consent Form Signed Patient voided / foley secured and emptied When did you last eato 0700 Last dose of injectable or oral agent na Ostomy pouch emptied and vented if applicable NA All implantable devices assessed, documented and approved NA Intravenous access site secured and place NA Valuables secured Linens and cotton and cotton/polyester blend (less than 51% polyester) Personal oil-based products / skin lotions / body lotions removed Wigs or hairpieces removed Smoking or tobacco materials removed Books / newspapers / magazines / loose paper removed Cologne, aftershave, perfume and deodorant removed Jewelry removed (may wrap wedding band) Make-up removed Hair care products removed Battery operated devices (external) removed Heating patches and chemical warmers removed Titanium eyewear removed NA Nail polish cured greater than 10 hours Casting material cured greater than 10 hours NA Hearing aids removed NA Loose dentures or partials removed NA Prosthetics have been removed NA Patient demonstrates correct use of air break device (if applicable) Patient concerns have been addressed Patient grounding bracelet on and cord  attached to chamber Specifics for Inpatients (complete in addition to above) Medication sheet sent with patient NA Intravenous medications needed or due during therapy sent with patient NA Drainage tubes (e.g. nasogastric tube or chest tube secured and vented) NA Endotracheal or Tracheotomy tube secured NA Cuff deflated  of air and inflated with saline NA Airway suctioned NA Electronic Signature(s) Signed: 08/18/2020 5:46:28 PM By: Levan Hurst RN, BSN Entered By: Levan Hurst on 08/18/2020 10:23:58

## 2020-08-19 ENCOUNTER — Other Ambulatory Visit: Payer: Self-pay

## 2020-08-19 ENCOUNTER — Encounter (HOSPITAL_BASED_OUTPATIENT_CLINIC_OR_DEPARTMENT_OTHER): Payer: Medicare Other | Attending: Internal Medicine | Admitting: Internal Medicine

## 2020-08-19 DIAGNOSIS — L598 Other specified disorders of the skin and subcutaneous tissue related to radiation: Secondary | ICD-10-CM | POA: Insufficient documentation

## 2020-08-19 DIAGNOSIS — M272 Inflammatory conditions of jaws: Secondary | ICD-10-CM | POA: Diagnosis present

## 2020-08-19 NOTE — Progress Notes (Signed)
MIMIE, GOERING ([id number]) Visit Report for 08/19/2020 Arrival Information Details Patient Name: Date of Service: [place], [name] 08/19/2020 8:00 A M Medical Record Number: [id number] Patient Account Number: [id number] Date of Birth/Sex: Treating RN: [birth year] (60 y.o. Nancy Fetter Primary Care Jazzmon Prindle: [name] Other Clinician: Referring Kaeleigh Westendorf: Treating Caelen Higinbotham/Extender: Rosana Hoes, [name] in Treatment: 1 Visit Information History Since Last Visit Added or deleted any medications: No Patient Arrived: Ambulatory Any new allergies or adverse reactions: No Arrival Time: 07:50 Had a fall or experienced change in No Accompanied By: alone activities of daily living that may affect Transfer Assistance: None risk of falls: Patient Identification Verified: Yes Signs or symptoms of abuse/neglect since last visito No Secondary Verification Process Completed: Yes Hospitalized since last visit: No Patient Requires Transmission-Based Precautions: No Implantable device outside of the clinic excluding No Patient Has Alerts: No cellular tissue based products placed in the center since last visit: Pain Present Now: No Electronic Signature(s) Signed: 08/19/2020 5:35:04 PM By: Levan Hurst RN, BSN Entered By: Levan Hurst on 08/19/2020 08:50:38 -------------------------------------------------------------------------------- Encounter Discharge Information Details Patient Name: Date of Service: [name] 08/19/2020 8:00 A M Medical Record Number: [id number] Patient Account Number: [id number] Date of Birth/Sex: Treating RN: May 14, 1961 (60 y.o. Nancy Fetter Primary Care Samael Blades: [name] Other Clinician: Referring Alveria Mcglaughlin: Treating Soliana Kitko/Extender: Cleophas Dunker in Treatment: 1 Encounter Discharge Information Items Discharge Condition: Stable Ambulatory Status: Ambulatory Discharge Destination:  Home Transportation: Private Auto Accompanied By: alone Schedule Follow-up Appointment: Yes Clinical Summary of Care: Patient Declined Electronic Signature(s) Signed: 08/19/2020 5:35:04 PM By: Levan Hurst RN, BSN Entered By: Levan Hurst on 08/19/2020 10:56:03 -------------------------------------------------------------------------------- Vitals Details Patient Name: Date of Service: [name], [name] 08/19/2020 8:00 A M Medical Record Number: [id number] Patient Account Number: [id number] Date of Birth/Sex: Treating RN: January 16, 1961 (60 y.o. Nancy Fetter Primary Care Jaymir Struble: [name] Other Clinician: Referring Donovan Gatchel: Treating Voshon Petro/Extender: Rosana Hoes, [name] in Treatment: 1 Vital Signs Time Taken: 07:50 Temperature (F): 97.9 Height (in): 65 Pulse (bpm): 77 Weight (lbs): 175 Respiratory Rate (breaths/min): 18 Body Mass Index (BMI): 29.1 Blood Pressure (mmHg): 141/94 Reference Range: 80 - 120 mg / dl Electronic Signature(s) Signed: 08/19/2020 5:35:04 PM By: Levan Hurst RN, BSN Entered By: Levan Hurst on 08/19/2020 08:50:59

## 2020-08-19 NOTE — Progress Notes (Signed)
KINNEDY, MONGIELLO ([id number]) Visit Report for 08/19/2020 SuperBill Details Patient Name: Date of Service: [place], [name] 08/19/2020 Medical Record Number: [id number] Patient Account Number: [id number] Date of Birth/Sex: Treating RN: Sep 22, 1960 (60 y.o. 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 Encounter for antineoplastic radiation therapy L59.8 Other specified disorders of the skin and subcutaneous tissue related to radiation Facility Procedures CPT4 Code Description Modifier Quantity [id number] G0277-(Facility Use Only) HBOT full body chamber, 47min , 4 Physician Procedures Quantity CPT4 Code Description Modifier [id number] 03704 - WC PHYS HYPERBARIC OXYGEN THERAPY 1 ICD-10 Diagnosis Description 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

## 2020-08-19 NOTE — Progress Notes (Signed)
Nancy [name], Nancy [name] ([id number]) Visit Report for 08/19/2020 HBO Details Patient Name: Date of Service: [place], [name] 08/19/2020 8:00 A M Medical Record Number: [id number] Patient Account Number: [id number] Date of Birth/Sex: Treating RN: [birth year] (60 y.o. 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 HBO Treatment Details Treatment Number: 5 Patient Type: Outpatient Chamber Type: Monoplace Chamber Serial #: [id number] Treatment Protocol: 2.5 ATA with 90 minutes oxygen, with two 5 minute air breaks Treatment Details Compression Rate Down: 2.0 psi / minute De-Compression Rate Up: A breaks and breathing ir Compress Tx Pressure periods Decompress Decompress Begins Reached (leave unused spaces Begins Ends blank) Chamber Pressure (ATA 1 2.5 2.5 2.5 2.5 2.5 - - 2.5 1 ) Clock Time (24 hr) 07:57 08:09 08:39 08:44 09:14 09:19 - - 09:49 10:01 Treatment Length: 124 (minutes) Treatment Segments: 4 Vital Signs Capillary Blood Glucose Reference Range: 80 - 120 mg / dl HBO Diabetic Blood Glucose Intervention Range: <131 mg/dl or >249 mg/dl Time Vitals Blood Respiratory Capillary Blood Glucose Pulse Action Type: Pulse: Temperature: Taken: Pressure: Rate: Glucose (mg/dl): Meter #: Oximetry (%) Taken: Pre 07:50 141/94 77 18 97.9 Post 10:01 170/94 60 18 98.2 Treatment Response Treatment Completion Status: Treatment Completed without Adverse Event Nancy [name] Notes No concerns with treatment given Physician HBO Attestation: I certify that I supervised this HBO treatment in accordance with Medicare guidelines. A trained emergency response team is readily  available per Yes hospital policies and procedures. Continue HBOT as ordered. Yes Electronic Signature(s) Signed: 08/19/2020 5:01:47 PM By: Linton Ham MD Entered By: Linton Ham on 08/19/2020 12:58:43 -------------------------------------------------------------------------------- HBO Safety Checklist Details Patient Name: Date of Service: Nancy [name] NA D. 08/19/2020 8:00 A M Medical Record Number: [id number] Patient Account Number: [id number] Date of Birth/Sex: Treating RN: 12-14-1960 (60 y.o. Nancy Fetter Primary Care Maryjane Benedict: [name] Other Clinician: Referring Kou Gucciardo: Treating Camrin Gearheart/Extender: Rosana Hoes, [name] in Treatment: 1 HBO Safety Checklist Items Safety Checklist Consent Form Signed Patient voided / foley secured and emptied When did you last eato 0630 Last dose of injectable or oral agent na Ostomy pouch emptied and vented if applicable NA All implantable devices assessed, documented and approved NA Intravenous access site secured and place NA Valuables secured Linens and cotton and cotton/polyester blend (less than 51% polyester) Personal oil-based products / skin lotions / body lotions removed Wigs or hairpieces removed Smoking or tobacco materials removed Books / newspapers / magazines / loose paper removed Cologne, aftershave, perfume and deodorant removed Jewelry removed (may wrap wedding band) Make-up removed Hair care products removed Battery operated devices (external) removed Heating patches and chemical warmers removed Titanium eyewear removed NA Nail polish cured greater than 10 hours Casting material cured greater than 10 hours NA Hearing aids removed NA Loose dentures or partials removed NA Prosthetics have been removed NA Patient demonstrates correct use of air break device (if applicable) Patient concerns have been addressed Patient grounding bracelet on and cord attached to  chamber Specifics for Inpatients (complete in addition to above) Medication sheet sent with patient NA Intravenous medications needed or due during therapy sent with patient NA Drainage tubes (e.g. nasogastric tube or chest tube secured and vented) NA Endotracheal or Tracheotomy tube secured NA Cuff deflated of air and inflated  with saline NA Airway suctioned NA Electronic Signature(s) Signed: 08/19/2020 5:35:04 PM By: Levan Hurst RN, BSN Entered By: Levan Hurst on 08/19/2020 08:53:26

## 2020-08-22 ENCOUNTER — Other Ambulatory Visit: Payer: Self-pay

## 2020-08-22 ENCOUNTER — Encounter (HOSPITAL_BASED_OUTPATIENT_CLINIC_OR_DEPARTMENT_OTHER): Payer: Medicare Other | Admitting: Internal Medicine

## 2020-08-22 DIAGNOSIS — M272 Inflammatory conditions of jaws: Secondary | ICD-10-CM | POA: Diagnosis not present

## 2020-08-22 NOTE — Progress Notes (Signed)
Nancy [name], Nancy [name] ([id number]) Visit Report for 08/22/2020 HBO Details Patient Name: Date of Service: Nancy [name], [name] 08/22/2020 8:00 A M Medical Record Number: [id number] Patient Account Number: [id number] Date of Birth/Sex: Treating RN: 03-13-1961 (60 y.o. 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 HBO Treatment Details Treatment Number: 6 Patient Type: Outpatient Chamber Type: Monoplace Chamber Serial #: [id number] Treatment Protocol: 2.5 ATA with 90 minutes oxygen, with two 5 minute air breaks Treatment Details Compression Rate Down: 2.0 psi / minute De-Compression Rate Up: 2.0 psi / minute A breaks and breathing ir Compress Tx Pressure periods Decompress Decompress Begins Reached (leave unused spaces Begins Ends blank) Chamber Pressure (ATA 1 2.5 2.5 2.5 2.5 2.5 - - 2.5 1 ) Clock Time (24 hr) 08:02 08:14 08:44 08:49 09:19 09:24 - - 09:54 10:04 Treatment Length: 122 (minutes) Treatment Segments: 4 Vital Signs Capillary Blood Glucose Reference Range: 80 - 120 mg / dl HBO Diabetic Blood Glucose Intervention Range: <131 mg/dl or >249 mg/dl Time Vitals Blood Respiratory Capillary Blood Glucose Pulse Action Type: Pulse: Temperature: Taken: Pressure: Rate: Glucose (mg/dl): Meter #: Oximetry (%) Taken: Pre 07:52 135/82 69 18 97.9 Post 10:05 140/93 65 16 98.2 Treatment Response Treatment Toleration: Well Treatment Completion Status: Treatment Completed without Adverse Event Nancy [name] Notes No concerns with treatment given Physician HBO Attestation: I certify that I supervised this HBO treatment in accordance with Medicare guidelines.  A trained emergency response team is readily available per Yes hospital policies and procedures. Continue HBOT as ordered. Yes Electronic Signature(s) Signed: 08/22/2020 4:59:32 PM By: Nancy Ham MD Previous Signature: 08/22/2020 4:57:52 PM Version By: Nancy [name] Entered By: Nancy [name] on 08/22/2020 16:58:48 -------------------------------------------------------------------------------- HBO Safety Checklist Details Patient Name: Date of Service: Nancy [name] NA D. 08/22/2020 8:00 A M Medical Record Number: [id number] Patient Account Number: [id number] Date of Birth/Sex: Treating RN: 30-Apr-1961 (60 y.o. Nancy [name], Nancy [name] Primary Care Nancy [name]: Nancy [name] Other Clinician: Referring Nancy [name]: Treating Nancy [name] in Treatment: 1 HBO Safety Checklist Items Safety Checklist Consent Form Signed Patient voided / foley secured and emptied When did you last eato last night Last dose of injectable or oral agent n/a Ostomy pouch emptied and vented if applicable NA All implantable devices assessed, documented and approved NA Intravenous access site secured and place NA Valuables secured Linens and cotton and cotton/polyester blend (less than 51% polyester) Personal oil-based products / skin lotions / body lotions removed NA Wigs or hairpieces removed NA Smoking or tobacco materials removed NA Books / newspapers / magazines / loose paper removed NA Cologne, aftershave, perfume and deodorant removed NA Jewelry removed (may wrap wedding band) Make-up removed NA Hair care products removed NA Battery operated devices (external) removed NA Heating patches and chemical warmers removed NA Titanium eyewear removed Nail polish cured greater than 10 hours NA Casting material cured greater than 10 hours NA Hearing aids removed NA Loose dentures or partials removed NA Prosthetics have been removed NA Patient demonstrates  correct use of air break device (if applicable) Patient concerns have been addressed Patient grounding bracelet on and cord attached to chamber Specifics for Inpatients (complete in addition to above) Medication sheet sent with patient Intravenous medications needed or due during therapy sent with patient  Drainage tubes (e.g. nasogastric tube or chest tube secured and vented) Endotracheal or Tracheotomy tube secured Cuff deflated of air and inflated with saline Airway suctioned Electronic Signature(s) Signed: 08/22/2020 4:57:52 PM By: Nancy [name] Entered By: Nancy [name] on 08/22/2020 09:10:10

## 2020-08-22 NOTE — Progress Notes (Signed)
Nancy [name], Nancy [name] ([id number]) Visit Report for 08/22/2020 Arrival Information Details Patient Name: Date of Service: Nancy [name], [name] 08/22/2020 8:00 A M Medical Record Number: [id number] Patient Account Number: [id number] Date of Birth/Sex: Treating RN: 01/20/1961 (60 y.o. Nancy [name], Nancy [name] Primary Care Nancy [name]: Nancy [name] Other Clinician: Referring Nancy [name]: Treating Goble Fudala/Extender: Rosana Hoes, [name] in Treatment: 1 Visit Information History Since Last Visit Added or deleted any medications: No Patient Arrived: Ambulatory Any new allergies or adverse reactions: No Arrival Time: 07:52 Had a fall or experienced change in No Accompanied By: self activities of daily living that may affect Transfer Assistance: None risk of falls: Patient Identification Verified: Yes Signs or symptoms of abuse/neglect since last visito No Secondary Verification Process Completed: Yes Hospitalized since last visit: No Patient Requires Transmission-Based Precautions: No Implantable device outside of the clinic excluding No Patient Has Alerts: No cellular tissue based products placed in the center since last visit: Pain Present Now: No Electronic Signature(s) Signed: 08/22/2020 4:57:52 PM By: [name] Entered By: [name] on 08/22/2020 09:09:20 -------------------------------------------------------------------------------- Encounter Discharge Information Details Patient Name: Date of Service: Nancy [name], Nancy NA D. 08/22/2020 8:00 A M Medical Record Number: [id number] Patient Account Number: [id number] Date of Birth/Sex: Treating RN: [birth year] (60 y.o. [name] Primary Care Kace Hartje: Nancy [name] Other Clinician: Referring Szymon Foiles: Treating Danaya Geddis/Extender: Cleophas Dunker in Treatment: 1 Encounter Discharge Information Items Discharge Condition: Stable Ambulatory Status: Ambulatory Discharge Destination:  Home Transportation: Private Auto Accompanied By: self Schedule Follow-up Appointment: Yes Clinical Summary of Care: Electronic Signature(s) Signed: 08/22/2020 4:57:52 PM By: [name] Entered By: [name] on 08/22/2020 10:20:47 -------------------------------------------------------------------------------- Vitals Details Patient Name: Date of Service: Nancy [name], Nancy NA D. 08/22/2020 8:00 A M Medical Record Number: [id number] Patient Account Number: [id number] Date of Birth/Sex: Treating RN: January 23, 1961 (60 y.o. Nancy [name], Nancy [name] Primary Care Nissim Fleischer: Nancy [name] Other Clinician: Referring Lacey Dotson: Treating Admiral Marcucci/Extender: Rosana Hoes, [name] in Treatment: 1 Vital Signs Time Taken: 07:52 Temperature (F): 97.9 Height (in): 65 Pulse (bpm): 69 Weight (lbs): 175 Respiratory Rate (breaths/min): 18 Body Mass Index (BMI): 29.1 Blood Pressure (mmHg): 135/82 Reference Range: 80 - 120 mg / dl Electronic Signature(s) Signed: 08/22/2020 4:57:52 PM By: [name] Entered By: [name] on 08/22/2020 09:09:37

## 2020-08-22 NOTE — Progress Notes (Signed)
MCKINLEE, DUNK ([id number]) Visit Report for 08/22/2020 SuperBill Details Patient Name: Date of Service: [place], [name] 08/22/2020 Medical Record Number: [id number] Patient Account Number: [id number] Date of Birth/Sex: Treating RN: 10-09-1960 (60 y.o. 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 Facility Procedures CPT4 Code Description Modifier Quantity 78295621 G0277-(Facility Use Only) HBOT full body chamber, 54min , 4 Physician Procedures Quantity CPT4 Code Description Modifier [id number] 46962 - WC PHYS HYPERBARIC OXYGEN THERAPY 1 ICD-10 Diagnosis Description 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

## 2020-08-23 ENCOUNTER — Encounter (HOSPITAL_BASED_OUTPATIENT_CLINIC_OR_DEPARTMENT_OTHER): Payer: Medicare Other | Admitting: Internal Medicine

## 2020-08-23 ENCOUNTER — Other Ambulatory Visit: Payer: Self-pay

## 2020-08-23 DIAGNOSIS — M272 Inflammatory conditions of jaws: Secondary | ICD-10-CM | POA: Diagnosis not present

## 2020-08-24 ENCOUNTER — Encounter (HOSPITAL_BASED_OUTPATIENT_CLINIC_OR_DEPARTMENT_OTHER): Payer: Medicare Other | Admitting: Physician Assistant

## 2020-08-24 ENCOUNTER — Other Ambulatory Visit: Payer: Self-pay

## 2020-08-24 DIAGNOSIS — M272 Inflammatory conditions of jaws: Secondary | ICD-10-CM | POA: Diagnosis not present

## 2020-08-24 NOTE — Progress Notes (Signed)
Nancy [name], Nancy [name] ([id number]) Visit Report for 08/23/2020 SuperBill Details Patient Name: Date of Service: [place], [name] 08/23/2020 Medical Record Number: [id number] Patient Account Number: [id number] Date of Birth/Sex: Treating RN: 13-Dec-1960 (60 y.o. 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

## 2020-08-24 NOTE — Progress Notes (Signed)
ELIZEBETH, KLUESNER ([id number]) Visit Report for 08/23/2020 Arrival Information Details Patient Name: Date of Service: [place], [name] 08/23/2020 8:00 A M Medical Record Number: [id number] Patient Account Number: [id number] Date of Birth/Sex: Treating RN: Jan 30, 1961 (60 y.o. Nancy Fetter Primary Care Alayza Pieper: [name] Other Clinician: Referring Farzad Tibbetts: Treating Lavena Loretto/Extender: Rosana Hoes, [name] in Treatment: 1 Visit Information History Since Last Visit Added or deleted any medications: No Patient Arrived: Ambulatory Any new allergies or adverse reactions: No Arrival Time: 07:58 Had a fall or experienced change in No Accompanied By: alone activities of daily living that may affect Transfer Assistance: None risk of falls: Patient Identification Verified: Yes Signs or symptoms of abuse/neglect since last visito No Secondary Verification Process Completed: Yes Implantable device outside of the clinic excluding No Patient Requires Transmission-Based Precautions: No cellular tissue based products placed in the center Patient Has Alerts: No since last visit: Pain Present Now: No Electronic Signature(s) Signed: 08/24/2020 5:44:43 PM By: Levan Hurst RN, BSN Entered By: Levan Hurst on 08/23/2020 08:28:23 -------------------------------------------------------------------------------- Encounter Discharge Information Details Patient Name: Date of Service: [name] 08/23/2020 8:00 A M Medical Record Number: [id number] Patient Account Number: [id number] Date of Birth/Sex: Treating RN: 1960/10/03 (60 y.o. Nancy Fetter Primary Care Durga Saldarriaga: [name] Other Clinician: Referring Seva Chancy: Treating Jessen Siegman/Extender: Cleophas Dunker in Treatment: 1 Encounter Discharge Information Items Discharge Condition: Stable Ambulatory Status: Ambulatory Discharge Destination: Home Transportation: Private  Auto Accompanied By: alone Schedule Follow-up Appointment: Yes Clinical Summary of Care: Patient Declined Electronic Signature(s) Signed: 08/24/2020 5:44:43 PM By: Levan Hurst RN, BSN Entered By: Levan Hurst on 08/23/2020 11:06:55 -------------------------------------------------------------------------------- Vitals Details Patient Name: Date of Service: [name], [name] 08/23/2020 8:00 A M Medical Record Number: [id number] Patient Account Number: [id number] Date of Birth/Sex: Treating RN: May 08, 1961 (60 y.o. Nancy Fetter Primary Care Dyneshia Baccam: [name] Other Clinician: Referring Dalylah Ramey: Treating Bernedette Auston/Extender: Rosana Hoes, [name] in Treatment: 1 Vital Signs Time Taken: 07:58 Temperature (F): 97.9 Height (in): 65 Pulse (bpm): 78 Weight (lbs): 175 Respiratory Rate (breaths/min): 16 Body Mass Index (BMI): 29.1 Blood Pressure (mmHg): 133/81 Reference Range: 80 - 120 mg / dl Electronic Signature(s) Signed: 08/24/2020 5:44:43 PM By: Levan Hurst RN, BSN Entered By: Levan Hurst on 08/23/2020 08:28:53

## 2020-08-24 NOTE — Progress Notes (Signed)
LAINIE, DAUBERT ([id number]) Visit Report for 08/24/2020 Arrival Information Details Patient Name: Date of Service: [place], [name] 08/24/2020 8:00 A M Medical Record Number: [id number] Patient Account Number: [id number] Date of Birth/Sex: Treating RN: Oct 17, 1960 (60 y.o. [name], [name] Primary Care Elane Peabody: [name] Other Clinician: Referring Marykatherine Sherwood: Treating Barbra Miner/Extender: [name], [name] in Treatment: 1 Visit Information History Since Last Visit Added or deleted any medications: No Patient Arrived: Ambulatory Any new allergies or adverse reactions: No Arrival Time: 07:55 Had a fall or experienced change in No Accompanied By: self activities of daily living that may affect Transfer Assistance: None risk of falls: Patient Identification Verified: Yes Signs or symptoms of abuse/neglect since last visito No Secondary Verification Process Completed: Yes Hospitalized since last visit: No Patient Requires Transmission-Based Precautions: No Implantable device outside of the clinic excluding No Patient Has Alerts: No cellular tissue based products placed in the center since last visit: Pain Present Now: No Electronic Signature(s) Signed: 08/24/2020 5:48:55 PM By: [name] Entered By: [name] on 08/24/2020 10:07:04 -------------------------------------------------------------------------------- Encounter Discharge Information Details Patient Name: Date of Service: [name], [name] 08/24/2020 8:00 A M Medical Record Number: [id number] Patient Account Number: [id number] Date of Birth/Sex: Treating RN: 08-Aug-1960 (60 y.o. [name] Primary Care Madaline Lefeber: [name] Other Clinician: Referring Caedyn Tassinari: Treating Dimitri Dsouza/Extender: [name], [name] in Treatment: 1 Encounter Discharge Information Items Discharge Condition: Stable Ambulatory Status: Ambulatory Discharge Destination:  Home Transportation: Private Auto Accompanied By: self Schedule Follow-up Appointment: Yes Clinical Summary of Care: Electronic Signature(s) Signed: 08/24/2020 5:48:55 PM By: [name] Entered By: [name] on 08/24/2020 10:24:26 -------------------------------------------------------------------------------- Vitals Details Patient Name: Date of Service: [name], [name] 08/24/2020 8:00 A M Medical Record Number: [id number] Patient Account Number: [id number] Date of Birth/Sex: Treating RN: Jan 28, 1961 (60 y.o. [name], [name] Primary Care Elah Avellino: [name] Other Clinician: Referring Linh Johannes: Treating Irving Lubbers/Extender: [name], [name] in Treatment: 1 Vital Signs Time Taken: 07:55 Temperature (F): 98.1 Height (in): 65 Pulse (bpm): 73 Weight (lbs): 175 Respiratory Rate (breaths/min): 18 Body Mass Index (BMI): 29.1 Blood Pressure (mmHg): 130/84 Reference Range: 80 - 120 mg / dl Electronic Signature(s) Signed: 08/24/2020 5:48:55 PM By: [name] Entered By: [name] on 08/24/2020 10:07:18

## 2020-08-24 NOTE — Progress Notes (Signed)
SACORA, HAWBAKER ([id number]) Visit Report for 08/23/2020 HBO Details Patient Name: Date of Service: [place], [name] 08/23/2020 8:00 A M Medical Record Number: [id number] Patient Account Number: [id number] Date of Birth/Sex: Treating RN: 08/03/1960 (60 y.o. 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 HBO Treatment Details Treatment Number: 7 Patient Type: Outpatient Chamber Type: Monoplace Chamber Serial #: [id number] Treatment Protocol: 2.5 ATA with 90 minutes oxygen, with two 5 minute air breaks Treatment Details Compression Rate Down: 2.0 psi / minute De-Compression Rate Up: 2.0 psi / minute A breaks and breathing ir Compress Tx Pressure periods Decompress Decompress Begins Reached (leave unused spaces Begins Ends blank) Chamber Pressure (ATA 1 2.5 2.5 2.5 2.5 2.5 - - 2.5 1 ) Clock Time (24 hr) 08:04 08:16 08:46 08:51 09:21 09:26 - - 09:56 10:08 Treatment Length: 124 (minutes) Treatment Segments: 4 Vital Signs Capillary Blood Glucose Reference Range: 80 - 120 mg / dl HBO Diabetic Blood Glucose Intervention Range: <131 mg/dl or >249 mg/dl Time Vitals Blood Respiratory Capillary Blood Glucose Pulse Action Type: Pulse: Temperature: Taken: Pressure: Rate: Glucose (mg/dl): Meter #: Oximetry (%) Taken: Pre 07:58 133/81 78 16 97.9 Post 10:08 134/79 64 18 98.2 Treatment Response Treatment Completion Status: Treatment Completed without Adverse Event Mimi Debellis Notes No concerns with treatment given Physician HBO Attestation: I certify that I supervised this HBO treatment in accordance with Medicare guidelines. A trained emergency  response team is readily available per Yes hospital policies and procedures. Continue HBOT as ordered. Yes Electronic Signature(s) Signed: 08/23/2020 4:57:40 PM By: Linton Ham MD Entered By: Linton Ham on 08/23/2020 16:49:10 -------------------------------------------------------------------------------- HBO Safety Checklist Details Patient Name: Date of Service: [name] 08/23/2020 8:00 A M Medical Record Number: [id number] Patient Account Number: [id number] Date of Birth/Sex: Treating RN: [birth year] (60 y.o. Nancy [name] Primary Care Porsha Skilton: [name] Other Clinician: Referring Marialy Urbanczyk: Treating Candie Gintz/Extender: Rosana Hoes, [name] in Treatment: 1 HBO Safety Checklist Items Safety Checklist Consent Form Signed Patient voided / foley secured and emptied When did you last eato 0700 Last dose of injectable or oral agent na Ostomy pouch emptied and vented if applicable NA All implantable devices assessed, documented and approved NA Intravenous access site secured and place NA Valuables secured Linens and cotton and cotton/polyester blend (less than 51% polyester) Personal oil-based products / skin lotions / body lotions removed Wigs or hairpieces removed Smoking or tobacco materials removed Books / newspapers / magazines / loose paper removed Cologne, aftershave, perfume and deodorant removed Jewelry removed (may wrap wedding band) Make-up removed Hair care products removed Battery operated devices (external) removed Heating patches and chemical warmers removed Titanium eyewear removed NA Nail polish cured greater than 10 hours Casting material cured greater than 10 hours NA Hearing aids removed NA Loose dentures or partials removed NA Prosthetics have been removed NA Patient demonstrates correct use of air break device (if applicable) Patient concerns have been addressed Patient grounding bracelet on and cord  attached to chamber Specifics for Inpatients (complete in addition to above) Medication sheet sent with patient NA Intravenous medications needed or due during therapy sent with patient NA Drainage tubes (e.g. nasogastric tube or chest tube secured and vented) NA Endotracheal or Tracheotomy tube secured NA Cuff deflated  of air and inflated with saline NA Airway suctioned NA Electronic Signature(s) Signed: 08/24/2020 5:44:43 PM By: Levan Hurst RN, BSN Entered By: Levan Hurst on 08/23/2020 08:29:36

## 2020-08-24 NOTE — Progress Notes (Signed)
[name], [name] ([id number]) Visit Report for 08/24/2020 SuperBill Details Patient Name: Date of Service: [name], [name] 08/24/2020 Medical Record Number: [id number] Patient Account Number: [id number] Date of Birth/Sex: Treating RN: 07-19-1960 (60 y.o. 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 Encounter for antineoplastic radiation therapy L59.8 Other specified disorders of the skin and subcutaneous tissue related to radiation Facility Procedures CPT4 Code Description Modifier Quantity [id number] G0277-(Facility Use Only) HBOT full body chamber, 76min , 4 Physician Procedures Quantity CPT4 Code Description Modifier [id number] 24235 - WC PHYS HYPERBARIC OXYGEN THERAPY 1 ICD-10 Diagnosis Description Z51.0 Encounter for antineoplastic radiation therapy 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

## 2020-08-24 NOTE — Progress Notes (Signed)
SENYA, [name] ([id number]) Visit Report for 08/24/2020 HBO Details Patient Name: Date of Service: [place], [name] 08/24/2020 8:00 A M Medical Record Number: [id number] Patient Account Number: [id number] Date of Birth/Sex: Treating RN: 1961/03/15 (60 y.o. 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 HBO Treatment Details Treatment Number: 8 Patient Type: Outpatient Chamber Type: Monoplace Chamber Serial #: [id number] Treatment Protocol: 2.5 ATA with 90 minutes oxygen, with two 5 minute air breaks Treatment Details Compression Rate Down: 2.0 psi / minute De-Compression Rate Up: 2.0 psi / minute A breaks and breathing ir Compress Tx Pressure periods Decompress Decompress Begins Reached (leave unused spaces Begins Ends blank) Chamber Pressure (ATA 1 2.5 2.5 2.5 2.5 2.5 - - 2.5 1 ) Clock Time (24 hr) 08:11 08:23 08:33 08:58 09:28 09:33 - - 10:03 10:13 Treatment Length: 122 (minutes) Treatment Segments: 4 Vital Signs Capillary Blood Glucose Reference Range: 80 - 120 mg / dl HBO Diabetic Blood Glucose Intervention Range: <131 mg/dl or >249 mg/dl Time Vitals Blood Respiratory Capillary Blood Glucose Pulse Action Type: Pulse: Temperature: Taken: Pressure: Rate: Glucose (mg/dl): Meter #: Oximetry (%) Taken: Pre 07:55 130/84 73 18 98.1 Post 10:14 146/95 62 18 97.6 Treatment Response Treatment Toleration: Well Treatment Completion Status: Treatment Completed without Adverse Event Electronic Signature(s) Signed: 08/24/2020 12:46:28 PM By: Worthy Keeler PA-C Signed: 08/24/2020 5:48:55 PM By: [name] Entered By: [name] on  08/24/2020 10:23:59 -------------------------------------------------------------------------------- HBO Safety Checklist Details Patient Name: Date of Service: Nancy [name], [name] 08/24/2020 8:00 A M Medical Record Number: [id number] Patient Account Number: [id number] Date of Birth/Sex: Treating RN: Jan 09, 1961 (60 y.o. Nancy [name], [name] Primary Care Gustavia Carie: [name] Other Clinician: Referring Larraine Argo: Treating Marie Borowski/Extender: [name], [name] in Treatment: 1 HBO Safety Checklist Items Safety Checklist Consent Form Signed Patient voided / foley secured and emptied When did you last eato last night Last dose of injectable or oral agent n/a Ostomy pouch emptied and vented if applicable NA All implantable devices assessed, documented and approved NA Intravenous access site secured and place NA Valuables secured Linens and cotton and cotton/polyester blend (less than 51% polyester) Personal oil-based products / skin lotions / body lotions removed NA Wigs or hairpieces removed NA Smoking or tobacco materials removed NA Books / newspapers / magazines / loose paper removed NA Cologne, aftershave, perfume and deodorant removed NA Jewelry removed (may wrap wedding band) Make-up removed NA Hair care products removed NA Battery operated devices (external) removed NA Heating patches and chemical warmers removed NA Titanium eyewear removed NA Nail polish cured greater than 10 hours NA Casting material cured greater than 10 hours NA Hearing aids removed NA Loose dentures or partials removed NA Prosthetics have been removed NA Patient demonstrates correct use of air break device (if applicable) Patient concerns have been addressed Patient grounding bracelet on and cord attached to chamber Specifics for Inpatients (complete in addition to above) Medication sheet sent with patient Intravenous medications needed or due during therapy sent with  patient Drainage tubes (e.g. nasogastric tube or chest tube secured and vented) Endotracheal or Tracheotomy tube secured Cuff deflated of air and inflated with saline Airway suctioned Electronic Signature(s) Signed: 08/24/2020 5:48:55 PM By: [name] Entered By: [name] on  08/24/2020 10:08:49 

## 2020-08-25 ENCOUNTER — Encounter (HOSPITAL_BASED_OUTPATIENT_CLINIC_OR_DEPARTMENT_OTHER): Payer: Medicare Other | Admitting: Internal Medicine

## 2020-08-25 ENCOUNTER — Other Ambulatory Visit: Payer: Self-pay

## 2020-08-25 DIAGNOSIS — M272 Inflammatory conditions of jaws: Secondary | ICD-10-CM | POA: Diagnosis not present

## 2020-08-25 NOTE — Progress Notes (Signed)
[name], [name] ([id number]) Visit Report for 08/25/2020 SuperBill Details Patient Name: Date of Service: [name], [name] 08/25/2020 Medical Record Number: [id number] Patient Account Number: [id number] Date of Birth/Sex: Treating RN: 05-19-1961 (60 y.o. 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 Encounter for antineoplastic radiation therapy L59.8 Other specified disorders of the skin and subcutaneous tissue related to radiation Facility Procedures CPT4 Code Description Modifier Quantity 75883254 G0277-(Facility Use Only) HBOT full body chamber, 3min , 4 Physician Procedures Quantity CPT4 Code Description Modifier [id number] 83094 - WC PHYS HYPERBARIC OXYGEN THERAPY 1 ICD-10 Diagnosis Description 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

## 2020-08-25 NOTE — Progress Notes (Signed)
PAYTAN, RECINE ([id number]) Visit Report for 08/25/2020 Arrival Information Details Patient Name: Date of Service: [place], [name] 08/25/2020 8:00 A M Medical Record Number: [id number] Patient Account Number: [id number] Date of Birth/Sex: Treating RN: 12/08/1960 (60 y.o. Nancy Fetter Primary Care Aneesh Faller: [name] Other Clinician: Referring Cashe Gatt: Treating Findlay Dagher/Extender: Rosana Hoes, [name] in Treatment: 2 Visit Information History Since Last Visit Added or deleted any medications: No Patient Arrived: Ambulatory Any new allergies or adverse reactions: No Arrival Time: 07:56 Had a fall or experienced change in No Accompanied By: alone activities of daily living that may affect Transfer Assistance: None risk of falls: Patient Identification Verified: Yes Signs or symptoms of abuse/neglect since last visito No Secondary Verification Process Completed: Yes Hospitalized since last visit: No Patient Requires Transmission-Based Precautions: No Implantable device outside of the clinic excluding No Patient Has Alerts: No cellular tissue based products placed in the center since last visit: Pain Present Now: No Electronic Signature(s) Signed: 08/25/2020 5:37:05 PM By: Levan Hurst RN, BSN Entered By: Levan Hurst on 08/25/2020 09:00:34 -------------------------------------------------------------------------------- Encounter Discharge Information Details Patient Name: Date of Service: [name] 08/25/2020 8:00 A M Medical Record Number: [id number] Patient Account Number: [id number] Date of Birth/Sex: Treating RN: 03-Mar-1961 (60 y.o. Nancy Fetter Primary Care Manveer Gomes: [name] Other Clinician: Referring Franca Stakes: Treating Aleigh Grunden/Extender: Cleophas Dunker in Treatment: 2 Encounter Discharge Information Items Discharge Condition: Stable Ambulatory Status: Ambulatory Discharge Destination:  Home Transportation: Private Auto Accompanied By: alone Schedule Follow-up Appointment: Yes Clinical Summary of Care: Patient Declined Electronic Signature(s) Signed: 08/25/2020 5:37:05 PM By: Levan Hurst RN, BSN Entered By: Levan Hurst on 08/25/2020 10:20:11 -------------------------------------------------------------------------------- Vitals Details Patient Name: Date of Service: [name], [name] 08/25/2020 8:00 A M Medical Record Number: [id number] Patient Account Number: [id number] Date of Birth/Sex: Treating RN: January 14, 1961 (60 y.o. Nancy Fetter Primary Care Clydia Nieves: [name] Other Clinician: Referring Shannen Vernon: Treating Plumer Mittelstaedt/Extender: Rosana Hoes, [name] in Treatment: 2 Vital Signs Time Taken: 07:56 Temperature (F): 97.9 Height (in): 65 Pulse (bpm): 70 Weight (lbs): 175 Respiratory Rate (breaths/min): 16 Body Mass Index (BMI): 29.1 Blood Pressure (mmHg): 140/71 Reference Range: 80 - 120 mg / dl Electronic Signature(s) Signed: 08/25/2020 5:37:05 PM By: Levan Hurst RN, BSN Entered By: Levan Hurst on 08/25/2020 09:01:52

## 2020-08-25 NOTE — Progress Notes (Signed)
Nancy [name], Nancy [name] ([id number]) Visit Report for 08/25/2020 HBO Details Patient Name: Date of Service: [place], [name] 08/25/2020 8:00 A M Medical Record Number: [id number] Patient Account Number: [id number] Date of Birth/Sex: Treating RN: [birth year] (60 y.o. 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 HBO Treatment Details Treatment Number: 9 Patient Type: Outpatient Chamber Type: Monoplace Chamber Serial #: [id number] Treatment Protocol: 2.5 ATA with 90 minutes oxygen, with two 5 minute air breaks Treatment Details Compression Rate Down: 2.0 psi / minute De-Compression Rate Up: 2.0 psi / minute A breaks and breathing ir Compress Tx Pressure periods Decompress Decompress Begins Reached (leave unused spaces Begins Ends blank) Chamber Pressure (ATA 1 2.5 2.5 2.5 2.5 2.5 - - 2.5 1 ) Clock Time (24 hr) 08:05 08:17 08:47 08:52 09:22 09:27 - - 09:57 10:09 Treatment Length: 124 (minutes) Treatment Segments: 4 Vital Signs Capillary Blood Glucose Reference Range: 80 - 120 mg / dl HBO Diabetic Blood Glucose Intervention Range: <131 mg/dl or >249 mg/dl Time Vitals Blood Respiratory Capillary Blood Glucose Pulse Action Type: Pulse: Temperature: Taken: Pressure: Rate: Glucose (mg/dl): Meter #: Oximetry (%) Taken: Pre 07:56 140/71 70 16 97.9 Post 10:09 158/78 65 18 98.1 Treatment Response Treatment Completion Status: Treatment Completed without Adverse Event Journee Bobrowski Notes No concerns with treatment given Physician HBO Attestation: I certify that I supervised this HBO treatment in accordance with Medicare guidelines. A trained emergency  response team is readily available per Yes hospital policies and procedures. Continue HBOT as ordered. Yes Electronic Signature(s) Signed: 08/25/2020 5:22:04 PM By: Linton Ham MD Entered By: Linton Ham on 08/25/2020 17:05:16 -------------------------------------------------------------------------------- HBO Safety Checklist Details Patient Name: Date of Service: [name] 08/25/2020 8:00 A M Medical Record Number: [id number] Patient Account Number: [id number] Date of Birth/Sex: Treating RN: 1960/09/19 (60 y.o. Nancy Fetter Primary Care Gigi Onstad: [name] Other Clinician: Referring Anniyah Mood: Treating Keeli Roberg/Extender: Rosana Hoes, [name] in Treatment: 2 HBO Safety Checklist Items Safety Checklist Consent Form Signed Patient voided / foley secured and emptied When did you last eato 0700 Last dose of injectable or oral agent na Ostomy pouch emptied and vented if applicable NA All implantable devices assessed, documented and approved NA Intravenous access site secured and place NA Valuables secured Linens and cotton and cotton/polyester blend (less than 51% polyester) Personal oil-based products / skin lotions / body lotions removed Wigs or hairpieces removed Smoking or tobacco materials removed Books / newspapers / magazines / loose paper removed Cologne, aftershave, perfume and deodorant removed Jewelry removed (may wrap wedding band) Make-up removed Hair care products removed Battery operated devices (external) removed Heating patches and chemical warmers removed Titanium eyewear removed NA Nail polish cured greater than 10 hours Casting material cured greater than 10 hours NA Hearing aids removed NA Loose dentures or partials removed NA Prosthetics have been removed NA Patient demonstrates correct use of air break device (if applicable) Patient concerns have been addressed Patient grounding bracelet on and cord  attached to chamber Specifics for Inpatients (complete in addition to above) Medication sheet sent with patient NA Intravenous medications needed or due during therapy sent with patient NA Drainage tubes (e.g. nasogastric tube or chest tube secured and vented) NA Endotracheal or Tracheotomy tube secured NA Cuff deflated  of air and inflated with saline NA Airway suctioned NA Electronic Signature(s) Signed: 08/25/2020 5:37:05 PM By: Levan Hurst RN, BSN Entered By: Levan Hurst on 08/25/2020 09:03:09

## 2020-08-26 ENCOUNTER — Encounter (HOSPITAL_BASED_OUTPATIENT_CLINIC_OR_DEPARTMENT_OTHER): Payer: Medicare Other | Admitting: Internal Medicine

## 2020-08-26 ENCOUNTER — Other Ambulatory Visit: Payer: Self-pay

## 2020-08-26 DIAGNOSIS — M272 Inflammatory conditions of jaws: Secondary | ICD-10-CM | POA: Diagnosis not present

## 2020-08-29 ENCOUNTER — Other Ambulatory Visit: Payer: Self-pay

## 2020-08-29 ENCOUNTER — Encounter (HOSPITAL_BASED_OUTPATIENT_CLINIC_OR_DEPARTMENT_OTHER): Payer: Medicare Other | Admitting: Internal Medicine

## 2020-08-29 DIAGNOSIS — M272 Inflammatory conditions of jaws: Secondary | ICD-10-CM | POA: Diagnosis not present

## 2020-08-29 NOTE — Progress Notes (Signed)
ENIOLA, CERULLO ([id number]) Visit Report for 08/26/2020 SuperBill Details Patient Name: Date of Service: [place], [name] 08/26/2020 Medical Record Number: [id number] Patient Account Number: [id number] Date of Birth/Sex: Treating RN: June 13, 1960 (60 y.o. 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 Encounter for antineoplastic radiation therapy L59.8 Other specified disorders of the skin and subcutaneous tissue related to radiation Facility Procedures CPT4 Code Description Modifier Quantity [id number] G0277-(Facility Use Only) HBOT full body chamber, 70min , 4 Physician Procedures Quantity CPT4 Code Description Modifier [id number] 01586 - WC PHYS HYPERBARIC OXYGEN THERAPY 1 ICD-10 Diagnosis Description 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

## 2020-08-29 NOTE — Progress Notes (Signed)
ALLINE, PIO ([id number]) Visit Report for 08/29/2020 SuperBill Details Patient Name: Date of Service: [place], [name] 08/29/2020 Medical Record Number: [id number] Patient Account Number: [id number] Date of Birth/Sex: Treating RN: Apr 24, 1961 (60 y.o. 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 Encounter for antineoplastic radiation therapy L59.8 Other specified disorders of the skin and subcutaneous tissue related to radiation Facility Procedures CPT4 Code Description Modifier Quantity [id number] G0277-(Facility Use Only) HBOT full body chamber, 69min , 4 Physician Procedures Quantity CPT4 Code Description Modifier 8592763 94320 - WC PHYS HYPERBARIC OXYGEN THERAPY 1 ICD-10 Diagnosis Description 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

## 2020-08-29 NOTE — Progress Notes (Signed)
Nancy, [name] ([id number]) Visit Report for 08/26/2020 Arrival Information Details Patient Name: Date of Service: [place], [name] 08/26/2020 8:00 A M Medical Record Number: [id number] Patient Account Number: [id number] Date of Birth/Sex: Treating RN: 10-13-1960 (60 y.o. Nancy [name] Primary Care Nancy [name]: Nancy [name] Other Clinician: Referring Nancy [name]: Treating Nancy [name]/Extender: Rosana Hoes, Nancy [name] in Treatment: 2 Visit Information History Since Last Visit Added or deleted any medications: No Patient Arrived: Ambulatory Any new allergies or adverse reactions: No Arrival Time: 07:51 Had a fall or experienced change in No Accompanied By: alone activities of daily living that may affect Transfer Assistance: None risk of falls: Patient Identification Verified: Yes Signs or symptoms of abuse/neglect since last visito No Secondary Verification Process Completed: Yes Hospitalized since last visit: No Patient Requires Transmission-Based Precautions: No Implantable device outside of the clinic excluding No Patient Has Alerts: No cellular tissue based products placed in the center since last visit: Pain Present Now: No Electronic Signature(s) Signed: 08/29/2020 5:30:45 PM By: Levan Hurst RN, BSN Entered By: Levan Hurst on 08/26/2020 10:40:04 -------------------------------------------------------------------------------- Encounter Discharge Information Details Patient Name: Date of Service: Nancy [name] NA D. 08/26/2020 8:00 A M Medical Record Number: [id number] Patient Account Number: [id number] Date of Birth/Sex: Treating RN: [birth year] (60 y.o. Nancy [name] Primary Care Honora Searson: Nancy [name] Other Clinician: Referring Nancy [name]: Treating Nancy [name]/Extender: Nancy [name] in Treatment: 2 Encounter Discharge Information Items Discharge Condition: Stable Ambulatory Status: Ambulatory Discharge Destination:  Home Transportation: Private Auto Accompanied By: alone Schedule Follow-up Appointment: Yes Clinical Summary of Care: Patient Declined Electronic Signature(s) Signed: 08/29/2020 5:30:45 PM By: Levan Hurst RN, BSN Entered By: Levan Hurst on 08/26/2020 10:42:16 -------------------------------------------------------------------------------- Vitals Details Patient Name: Date of Service: Nancy [name], [name] 08/26/2020 8:00 A M Medical Record Number: [id number] Patient Account Number: [id number] Date of Birth/Sex: Treating RN: Feb 15, 1961 (60 y.o. Nancy [name] Primary Care Kairo Laubacher: Nancy [name] Other Clinician: Referring Brean Carberry: Treating Junetta Hearn/Extender: Rosana Hoes, Nancy [name] in Treatment: 2 Vital Signs Time Taken: 07:51 Temperature (F): 98.1 Height (in): 65 Pulse (bpm): 66 Weight (lbs): 175 Respiratory Rate (breaths/min): 18 Body Mass Index (BMI): 29.1 Blood Pressure (mmHg): 132/71 Reference Range: 80 - 120 mg / dl Electronic Signature(s) Signed: 08/29/2020 5:30:45 PM By: Levan Hurst RN, BSN Entered By: Levan Hurst on 08/26/2020 10:40:22

## 2020-08-29 NOTE — Progress Notes (Signed)
Nancy [name], Nancy [name] ([id number]) Visit Report for 08/26/2020 HBO Details Patient Name: Date of Service: [place], [name] 08/26/2020 8:00 A M Medical Record Number: [id number] Patient Account Number: [id number] Date of Birth/Sex: Treating RN: [birth year] (60 y.o. 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 HBO Treatment Details Treatment Number: 10 Patient Type: Outpatient Chamber Type: Monoplace Chamber Serial #: [id number] Treatment Protocol: 2.5 ATA with 90 minutes oxygen, with two 5 minute air breaks Treatment Details Compression Rate Down: 2.0 psi / minute De-Compression Rate Up: 2.0 psi / minute A breaks and breathing ir Compress Tx Pressure periods Decompress Decompress Begins Reached (leave unused spaces Begins Ends blank) Chamber Pressure (ATA 1 2.5 2.5 2.5 2.5 2.5 - - 2.5 1 ) Clock Time (24 hr) 08:01 08:13 08:43 08:48 09:18 09:23 - - 09:53 10:05 Treatment Length: 124 (minutes) Treatment Segments: 4 Vital Signs Capillary Blood Glucose Reference Range: 80 - 120 mg / dl HBO Diabetic Blood Glucose Intervention Range: <131 mg/dl or >249 mg/dl Time Vitals Blood Respiratory Capillary Blood Glucose Pulse Action Type: Pulse: Temperature: Taken: Pressure: Rate: Glucose (mg/dl): Meter #: Oximetry (%) Taken: Pre 07:51 132/71 66 18 98.1 Post 10:05 139/74 58 16 98.1 Treatment Response Treatment Completion Status: Treatment Completed without Adverse Event Kathrina Crosley Notes No concerns with treatment given Physician HBO Attestation: I certify that I supervised this HBO treatment in accordance with Medicare guidelines. A trained emergency  response team is readily available per Yes hospital policies and procedures. Continue HBOT as ordered. Yes Electronic Signature(s) Signed: 08/26/2020 4:45:31 PM By: Linton Ham MD Entered By: Linton Ham on 08/26/2020 16:26:33 -------------------------------------------------------------------------------- HBO Safety Checklist Details Patient Name: Date of Service: Nancy [name] NA D. 08/26/2020 8:00 A M Medical Record Number: [id number] Patient Account Number: [id number] Date of Birth/Sex: Treating RN: Feb 23, 1961 (60 y.o. Nancy [name] Primary Care Marcelus Dubberly: [name] Other Clinician: Referring Cortnee Steinmiller: Treating Amarylis Rovito/Extender: Rosana Hoes, [name] in Treatment: 2 HBO Safety Checklist Items Safety Checklist Consent Form Signed Patient voided / foley secured and emptied When did you last eato 0700 Last dose of injectable or oral agent na Ostomy pouch emptied and vented if applicable NA All implantable devices assessed, documented and approved NA Intravenous access site secured and place NA Valuables secured Linens and cotton and cotton/polyester blend (less than 51% polyester) Personal oil-based products / skin lotions / body lotions removed Wigs or hairpieces removed Smoking or tobacco materials removed Books / newspapers / magazines / loose paper removed Cologne, aftershave, perfume and deodorant removed Jewelry removed (may wrap wedding band) Make-up removed Hair care products removed Battery operated devices (external) removed Heating patches and chemical warmers removed Titanium eyewear removed Nail polish cured greater than 10 hours Casting material cured greater than 10 hours NA Hearing aids removed NA Loose dentures or partials removed NA Prosthetics have been removed NA Patient demonstrates correct use of air break device (if applicable) Patient concerns have been addressed Patient grounding bracelet on and cord  attached to chamber Specifics for Inpatients (complete in addition to above) Medication sheet sent with patient NA Intravenous medications needed or due during therapy sent with patient NA Drainage tubes (e.g. nasogastric tube or chest tube secured and vented) NA Endotracheal or Tracheotomy tube secured NA Cuff deflated of  air and inflated with saline NA Airway suctioned NA Electronic Signature(s) Signed: 08/29/2020 5:30:45 PM By: Levan Hurst RN, BSN Entered By: Levan Hurst on 08/26/2020 10:41:01

## 2020-08-29 NOTE — Progress Notes (Signed)
Nancy [name], Nancy [name] ([id number]) Visit Report for 08/29/2020 HBO Details Patient Name: Date of Service: [place], [name] 08/29/2020 8:00 A M Medical Record Number: [id number] Patient Account Number: [id number] Date of Birth/Sex: Treating RN: 1960-07-04 (60 y.o. 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 HBO Treatment Details Treatment Number: 11 Patient Type: Outpatient Chamber Type: Monoplace Chamber Serial #: [id number] Treatment Protocol: 2.5 ATA with 90 minutes oxygen, with two 5 minute air breaks Treatment Details Compression Rate Down: 2.0 psi / minute De-Compression Rate Up: 2.0 psi / minute A breaks and breathing ir Compress Tx Pressure periods Decompress Decompress Begins Reached (leave unused spaces Begins Ends blank) Chamber Pressure (ATA 1 2.5 2.5 2.5 2.5 2.5 - - 2.5 1 ) Clock Time (24 hr) 08:01 08:13 08:43 08:48 09:18 09:23 - - 09:53 10:03 Treatment Length: 122 (minutes) Treatment Segments: 4 Vital Signs Capillary Blood Glucose Reference Range: 80 - 120 mg / dl HBO Diabetic Blood Glucose Intervention Range: <131 mg/dl or >249 mg/dl Time Vitals Blood Respiratory Capillary Blood Glucose Pulse Action Type: Pulse: Temperature: Taken: Pressure: Rate: Glucose (mg/dl): Meter #: Oximetry (%) Taken: Pre 07:53 138/90 66 18 98.1 Post 10:05 135/74 56 16 97.7 Treatment Response Treatment Toleration: Well Treatment Completion Status: Treatment Completed without Adverse Event Deangleo Passage Notes No concerns with treatment given Physician HBO Attestation: I certify that I supervised this HBO treatment in accordance with  Medicare guidelines. A trained emergency response team is readily available per Yes hospital policies and procedures. Continue HBOT as ordered. Yes Electronic Signature(s) Signed: 08/29/2020 4:45:43 PM By: Linton Ham MD Entered By: Linton Ham on 08/29/2020 16:45:06 -------------------------------------------------------------------------------- HBO Safety Checklist Details Patient Name: Date of Service: [name] 08/29/2020 8:00 A M Medical Record Number: [id number] Patient Account Number: [id number] Date of Birth/Sex: Treating RN: 1960/08/08 (60 y.o. Nancy [name], [name] Primary Care Olaf Mesa: [name] Other Clinician: Referring Sundance Moise: Treating Tymeka Privette/Extender: Rosana Hoes, [name] in Treatment: 2 HBO Safety Checklist Items Safety Checklist Consent Form Signed Patient voided / foley secured and emptied When did you last eato last night Last dose of injectable or oral agent n/a Ostomy pouch emptied and vented if applicable NA All implantable devices assessed, documented and approved Intravenous access site secured and place NA Valuables secured Linens and cotton and cotton/polyester blend (less than 51% polyester) Personal oil-based products / skin lotions / body lotions removed NA Wigs or hairpieces removed NA Smoking or tobacco materials removed NA Books / newspapers / magazines / loose paper removed NA Cologne, aftershave, perfume and deodorant removed NA Jewelry removed (may wrap wedding band) Make-up removed NA Hair care products removed NA Battery operated devices (external) removed NA Heating patches and chemical warmers removed NA Titanium eyewear removed Nail polish cured greater than 10 hours NA Casting material cured greater than 10 hours NA Hearing aids removed NA Loose dentures or partials removed NA Prosthetics have been removed NA Patient demonstrates correct use of air break device (if  applicable) Patient concerns have been addressed Patient grounding bracelet on and cord attached to chamber Specifics for Inpatients (complete in addition to above) Medication sheet sent with patient Intravenous medications needed or due during therapy sent with patient Drainage tubes (e.g. nasogastric tube or chest tube secured and  vented) Endotracheal or Tracheotomy tube secured Cuff deflated of air and inflated with saline Airway suctioned Electronic Signature(s) Signed: 08/29/2020 5:44:53 PM By: [name] Entered By: [name] on 08/29/2020 09:58:39

## 2020-08-29 NOTE — Progress Notes (Signed)
ABIA, MONACO ([id number]) Visit Report for 08/29/2020 Arrival Information Details Patient Name: Date of Service: [place], [name] 08/29/2020 8:00 A M Medical Record Number: [id number] Patient Account Number: [id number] Date of Birth/Sex: Treating RN: 1960/11/09 (60 y.o. [name], [name] Primary Care Javaria Knapke: [name] Other Clinician: Referring Laniqua Torrens: Treating Aquarius Latouche/Extender: Rosana Hoes, [name] in Treatment: 2 Visit Information History Since Last Visit Added or deleted any medications: No Patient Arrived: Ambulatory Any new allergies or adverse reactions: No Arrival Time: 07:53 Had a fall or experienced change in No Accompanied By: self activities of daily living that may affect Transfer Assistance: None risk of falls: Patient Identification Verified: Yes Signs or symptoms of abuse/neglect since last visito No Secondary Verification Process Completed: Yes Hospitalized since last visit: No Patient Requires Transmission-Based Precautions: No Implantable device outside of the clinic excluding No Patient Has Alerts: No cellular tissue based products placed in the center since last visit: Pain Present Now: No Electronic Signature(s) Signed: 08/29/2020 5:44:53 PM By: [name] Entered By: [name] on 08/29/2020 09:57:53 -------------------------------------------------------------------------------- Encounter Discharge Information Details Patient Name: Date of Service: [name], [name] 08/29/2020 8:00 A M Medical Record Number: [id number] Patient Account Number: [id number] Date of Birth/Sex: Treating RN: [birth year] (60 y.o. [name] Primary Care Salman Wellen: [name] Other Clinician: Referring Gionni Freese: Treating Jevaun Strick/Extender: Cleophas Dunker in Treatment: 2 Encounter Discharge Information Items Discharge Condition: Stable Ambulatory Status: Ambulatory Discharge Destination:  Home Transportation: Private Auto Accompanied By: self Schedule Follow-up Appointment: Yes Clinical Summary of Care: Electronic Signature(s) Signed: 08/29/2020 5:44:53 PM By: [name] Entered By: [name] on 08/29/2020 10:24:59 -------------------------------------------------------------------------------- Patient/Caregiver Education Details Patient Name: Date of Service: Eugenia Mcalpine D. 4/11/2022andnbsp8:00 A M Medical Record Number: [id number] Patient Account Number: [id number] Date of Birth/Gender: Treating RN: 1961/01/28 (60 y.o. [name] Primary Care Physician: [name] Other Clinician: Referring Physician: Treating Physician/Extender: Cleophas Dunker in Treatment: 2 Education Assessment Education Provided To: Patient Education Topics Provided Electronic Signature(s) Signed: 08/29/2020 5:44:53 PM By: [name] Entered By: [name] on 08/29/2020 10:24:51 -------------------------------------------------------------------------------- Vitals Details Patient Name: Date of Service: [name], [name] 08/29/2020 8:00 A M Medical Record Number: [id number] Patient Account Number: [id number] Date of Birth/Sex: Treating RN: 1961-04-13 (60 y.o. [name], [name] Primary Care Cleston Lautner: [name] Other Clinician: Referring Cordarryl Monrreal: Treating Zacory Fiola/Extender: Rosana Hoes, [name] in Treatment: 2 Vital Signs Time Taken: 07:53 Temperature (F): 98.1 Height (in): 65 Pulse (bpm): 66 Weight (lbs): 175 Respiratory Rate (breaths/min): 18 Body Mass Index (BMI): 29.1 Blood Pressure (mmHg): 138/90 Reference Range: 80 - 120 mg / dl Electronic Signature(s) Signed: 08/29/2020 5:44:53 PM By: [name] Entered By: [name] on 08/29/2020 09:58:10

## 2020-08-30 ENCOUNTER — Other Ambulatory Visit: Payer: Self-pay

## 2020-08-30 ENCOUNTER — Encounter (HOSPITAL_BASED_OUTPATIENT_CLINIC_OR_DEPARTMENT_OTHER): Payer: Medicare Other | Admitting: Internal Medicine

## 2020-08-30 DIAGNOSIS — M272 Inflammatory conditions of jaws: Secondary | ICD-10-CM | POA: Diagnosis not present

## 2020-08-31 ENCOUNTER — Encounter (HOSPITAL_BASED_OUTPATIENT_CLINIC_OR_DEPARTMENT_OTHER): Payer: Medicare Other | Admitting: Physician Assistant

## 2020-08-31 DIAGNOSIS — M272 Inflammatory conditions of jaws: Secondary | ICD-10-CM | POA: Diagnosis not present

## 2020-08-31 NOTE — Progress Notes (Signed)
KAMERIN, AXFORD ([id number]) Visit Report for 08/31/2020 HBO Details Patient Name: Date of Service: [place], [name] 08/31/2020 8:00 A M Medical Record Number: [id number] Patient Account Number: [id number] Date of Birth/Sex: Treating RN: 30-Oct-1960 (60 y.o. 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 HBO Treatment Details Treatment Number: 13 Patient Type: Outpatient Chamber Type: Monoplace Chamber Serial #: [id number] Treatment Protocol: 2.5 ATA with 90 minutes oxygen, with two 5 minute air breaks Treatment Details Compression Rate Down: 2.0 psi / minute De-Compression Rate Up: 2.0 psi / minute A breaks and breathing ir Compress Tx Pressure periods Decompress Decompress Begins Reached (leave unused spaces Begins Ends blank) Chamber Pressure (ATA 1 2.5 2.5 2.5 2.5 2.5 - - 2.5 1 ) Clock Time (24 hr) 08:08 08:20 08:50 08:55 09:25 09:30 - - 10:00 10:11 Treatment Length: 123 (minutes) Treatment Segments: 4 Vital Signs Capillary Blood Glucose Reference Range: 80 - 120 mg / dl HBO Diabetic Blood Glucose Intervention Range: <131 mg/dl or >249 mg/dl Time Vitals Blood Respiratory Capillary Blood Glucose Pulse Action Type: Pulse: Temperature: Taken: Pressure: Rate: Glucose (mg/dl): Meter #: Oximetry (%) Taken: Pre 07:51 139/90 63 18 97.4 Post 10:12 159/82 60 16 98.3 Treatment Response Treatment Toleration: Well Treatment Completion Status: Treatment Completed without Adverse Event Electronic Signature(s) Signed: 08/31/2020 12:26:47 PM By: [name] Signed: 08/31/2020 5:03:59 PM By: Worthy Keeler PA-C Entered By: [name] on  08/31/2020 10:21:36 -------------------------------------------------------------------------------- HBO Safety Checklist Details Patient Name: Date of Service: [name], Sheryn Bison NA D. 08/31/2020 8:00 A M Medical Record Number: [id number] Patient Account Number: [id number] Date of Birth/Sex: Treating RN: [birth year] (60 y.o. [name], [name] Primary Care Nancylee Gaines: [name] Other Clinician: Referring Bergen Melle: Treating Shahir Karen/Extender: [name], [name] in Treatment: 2 HBO Safety Checklist Items Safety Checklist Consent Form Signed Patient voided / foley secured and emptied When did you last eato last night Last dose of injectable or oral agent n/a Ostomy pouch emptied and vented if applicable NA All implantable devices assessed, documented and approved NA Intravenous access site secured and place NA Valuables secured Linens and cotton and cotton/polyester blend (less than 51% polyester) Personal oil-based products / skin lotions / body lotions removed NA Wigs or hairpieces removed NA Smoking or tobacco materials removed NA Books / newspapers / magazines / loose paper removed NA Cologne, aftershave, perfume and deodorant removed NA Jewelry removed (may wrap wedding band) Make-up removed NA Hair care products removed NA Battery operated devices (external) removed NA Heating patches and chemical warmers removed NA Titanium eyewear removed Nail polish cured greater than 10 hours NA Casting material cured greater than 10 hours NA Hearing aids removed NA Loose dentures or partials removed NA Prosthetics have been removed NA Patient demonstrates correct use of air break device (if applicable) Patient concerns have been addressed Patient grounding bracelet on and cord attached to chamber Specifics for Inpatients (complete in addition to above) Medication sheet sent with patient Intravenous medications needed or due during therapy sent with  patient Drainage tubes (e.g. nasogastric tube or chest tube secured and vented) Endotracheal or Tracheotomy tube secured Cuff deflated of air and inflated with saline Airway suctioned Electronic Signature(s) Signed: 08/31/2020 12:26:47 PM By: [name] Entered By: [name] on 08/31/2020  10:08:01 

## 2020-08-31 NOTE — Progress Notes (Signed)
[name], [name] ([id number]) Visit Report for 08/30/2020 HBO Details Patient Name: Date of Service: [name], [name] 08/30/2020 8:00 A M Medical Record Number: [id number] Patient Account Number: [id number] Date of Birth/Sex: Treating RN: [birth year] (60 y.o. 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 HBO Treatment Details Treatment Number: 12 Patient Type: Outpatient Chamber Type: Monoplace Chamber Serial #: [id number] Treatment Protocol: 2.5 ATA with 90 minutes oxygen, with two 5 minute air breaks Treatment Details Compression Rate Down: 2.0 psi / minute De-Compression Rate Up: 2.0 psi / minute A breaks and breathing ir Compress Tx Pressure periods Decompress Decompress Begins Reached (leave unused spaces Begins Ends blank) Chamber Pressure (ATA 1 2.5 2.5 2.5 2.5 2.5 - - 2.5 1 ) Clock Time (24 hr) 08:03 08:15 08:45 08:50 09:20 09:25 - - 09:55 10:07 Treatment Length: 124 (minutes) Treatment Segments: 4 Vital Signs Capillary Blood Glucose Reference Range: 80 - 120 mg / dl HBO Diabetic Blood Glucose Intervention Range: <131 mg/dl or >249 mg/dl Time Vitals Blood Respiratory Capillary Blood Glucose Pulse Action Type: Pulse: Temperature: Taken: Pressure: Rate: Glucose (mg/dl): Meter #: Oximetry (%) Taken: Pre 07:55 126/75 61 18 98 Post 10:07 143/84 58 16 97.9 Treatment Response Treatment Completion Status: Treatment Completed without Adverse Event Jamarl Pew Notes No concerns with treatment given Physician HBO Attestation: I certify that I supervised this HBO treatment in accordance with Medicare guidelines. A trained emergency  response team is readily available per Yes hospital policies and procedures. Continue HBOT as ordered. Yes Electronic Signature(s) Signed: 08/30/2020 4:33:39 PM By: Linton Ham MD Entered By: Linton Ham on 08/30/2020 16:15:20 -------------------------------------------------------------------------------- HBO Safety Checklist Details Patient Name: Date of Service: Nancy [name] NA D. 08/30/2020 8:00 A M Medical Record Number: [id number] Patient Account Number: [id number] Date of Birth/Sex: Treating RN: [birth year] (60 y.o. Nancy [name] Primary Care Omeka Holben: [name] Other Clinician: Referring Khala Tarte: Treating Angline Schweigert/Extender: Rosana Hoes, [name] in Treatment: 2 HBO Safety Checklist Items Safety Checklist Consent Form Signed Patient voided / foley secured and emptied When did you last eato 0700 Last dose of injectable or oral agent na Ostomy pouch emptied and vented if applicable NA All implantable devices assessed, documented and approved NA Intravenous access site secured and place NA Valuables secured Linens and cotton and cotton/polyester blend (less than 51% polyester) Personal oil-based products / skin lotions / body lotions removed Wigs or hairpieces removed Smoking or tobacco materials removed Books / newspapers / magazines / loose paper removed Cologne, aftershave, perfume and deodorant removed Jewelry removed (may wrap wedding band) Make-up removed Hair care products removed Battery operated devices (external) removed Heating patches and chemical warmers removed Titanium eyewear removed Nail polish cured greater than 10 hours Casting material cured greater than 10 hours NA Hearing aids removed NA Loose dentures or partials removed NA Prosthetics have been removed NA Patient demonstrates correct use of air break device (if applicable) Patient concerns have been addressed Patient grounding bracelet on and cord  attached to chamber Specifics for Inpatients (complete in addition to above) Medication sheet sent with patient NA Intravenous medications needed or due during therapy sent with patient NA Drainage tubes (e.g. nasogastric tube or chest tube secured and vented) NA Endotracheal or Tracheotomy tube secured NA Cuff deflated of  air and inflated with saline NA Airway suctioned NA Electronic Signature(s) Signed: 08/31/2020 5:13:29 PM By: Levan Hurst RN, BSN Entered By: Levan Hurst on 08/30/2020 09:39:45

## 2020-08-31 NOTE — Progress Notes (Signed)
Nancy [name], Nancy [name] ([id number]) Visit Report for 08/30/2020 SuperBill Details Patient Name: Date of Service: [place], [name] 08/30/2020 Medical Record Number: [id number] Patient Account Number: [id number] Date of Birth/Sex: Treating RN: Apr 29, 1961 (60 y.o. 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

## 2020-08-31 NOTE — Progress Notes (Signed)
SHERRIN, STAHLE ([id number]) Visit Report for 08/31/2020 SuperBill Details Patient Name: Date of Service: [place], [name] 08/31/2020 Medical Record Number: [id number] Patient Account Number: [id number] Date of Birth/Sex: Treating RN: 09/01/1960 (60 y.o. 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

## 2020-08-31 NOTE — Progress Notes (Signed)
Nancy [name], Nancy [name] ([id number]) Visit Report for 08/30/2020 Arrival Information Details Patient Name: Date of Service: [name], [name] 08/30/2020 8:00 A M Medical Record Number: [id number] Patient Account Number: [id number] Date of Birth/Sex: Treating RN: 1960-10-11 (60 y.o. Nancy [name] Primary Care [place]: [name] Other Clinician: Referring Katja Blue: Treating Aiko Belko/Extender: Rosana Hoes, [name] in Treatment: 2 Visit Information History Since Last Visit Added or deleted any medications: No Patient Arrived: Ambulatory Any new allergies or adverse reactions: No Arrival Time: 07:55 Had a fall or experienced change in No Accompanied By: alone activities of daily living that may affect Transfer Assistance: None risk of falls: Patient Identification Verified: Yes Signs or symptoms of abuse/neglect since last visito No Secondary Verification Process Completed: Yes Hospitalized since last visit: No Patient Requires Transmission-Based Precautions: No Implantable device outside of the clinic excluding No Patient Has Alerts: No cellular tissue based products placed in the center since last visit: Pain Present Now: No Electronic Signature(s) Signed: 08/31/2020 5:13:29 PM By: Levan Hurst RN, BSN Entered By: Levan Hurst on 08/30/2020 09:38:32 -------------------------------------------------------------------------------- Encounter Discharge Information Details Patient Name: Date of Service: Nancy [name] NA D. 08/30/2020 8:00 A M Medical Record Number: [id number] Patient Account Number: [id number] Date of Birth/Sex: Treating RN: 1960/12/06 (60 y.o. Nancy [name] Primary Care Somaly Marteney: [name] Other Clinician: Referring Jermari Tamargo: Treating Odile Veloso/Extender: Cleophas Dunker in Treatment: 2 Encounter Discharge Information Items Discharge Condition: Stable Ambulatory Status: Ambulatory Discharge Destination:  Home Transportation: Private Auto Accompanied By: alone Schedule Follow-up Appointment: Yes Clinical Summary of Care: Patient Declined Electronic Signature(s) Signed: 08/31/2020 5:13:29 PM By: Levan Hurst RN, BSN Entered By: Levan Hurst on 08/30/2020 11:35:09 -------------------------------------------------------------------------------- Vitals Details Patient Name: Date of Service: Nancy [name], [name] 08/30/2020 8:00 A M Medical Record Number: [id number] Patient Account Number: [id number] Date of Birth/Sex: Treating RN: 08/20/1960 (60 y.o. Nancy [name] Primary Care Lakara Weiland: [name] Other Clinician: Referring Ellamarie Naeve: Treating Maybree Riling/Extender: Rosana Hoes, [name] in Treatment: 2 Vital Signs Time Taken: 07:55 Temperature (F): 98.0 Height (in): 65 Pulse (bpm): 61 Weight (lbs): 175 Respiratory Rate (breaths/min): 18 Body Mass Index (BMI): 29.1 Blood Pressure (mmHg): 126/75 Reference Range: 80 - 120 mg / dl Electronic Signature(s) Signed: 08/31/2020 5:13:29 PM By: Levan Hurst RN, BSN Entered By: Levan Hurst on 08/30/2020 09:38:56

## 2020-08-31 NOTE — Progress Notes (Signed)
SALAYA, HOLTROP ([id number]) Visit Report for 08/31/2020 Arrival Information Details Patient Name: Date of Service: [place], [name] 08/31/2020 8:00 A M Medical Record Number: [id number] Patient Account Number: [id number] Date of Birth/Sex: Treating RN: 1960-08-10 (60 y.o. [name], [name] Primary Care Scherrie Seneca: [name] Other Clinician: Referring Jorgen Wolfinger: Treating Peg Fifer/Extender: [name], [name] in Treatment: 2 Visit Information History Since Last Visit Added or deleted any medications: No Patient Arrived: Ambulatory Any new allergies or adverse reactions: No Arrival Time: 07:51 Had a fall or experienced change in No Accompanied By: self activities of daily living that may affect Transfer Assistance: None risk of falls: Patient Identification Verified: Yes Signs or symptoms of abuse/neglect since last visito No Secondary Verification Process Completed: Yes Hospitalized since last visit: No Patient Requires Transmission-Based Precautions: No Implantable device outside of the clinic excluding No Patient Has Alerts: No cellular tissue based products placed in the center since last visit: Pain Present Now: No Electronic Signature(s) Signed: 08/31/2020 12:26:47 PM By: [name] Entered By: [name] on 08/31/2020 10:07:12 -------------------------------------------------------------------------------- Encounter Discharge Information Details Patient Name: Date of Service: [name], [name] 08/31/2020 8:00 A M Medical Record Number: [id number] Patient Account Number: [id number] Date of Birth/Sex: Treating RN: 1961/01/14 (60 y.o. [name] Primary Care Doninique Lwin: [name] Other Clinician: Referring Seann Genther: Treating Benetta Maclaren/Extender: [name], [name] in Treatment: 2 Encounter Discharge Information Items Discharge Condition: Stable Ambulatory Status: Ambulatory Discharge Destination:  Home Transportation: Private Auto Accompanied By: self Schedule Follow-up Appointment: Yes Clinical Summary of Care: Electronic Signature(s) Signed: 08/31/2020 12:26:47 PM By: [name] Entered By: [name] on 08/31/2020 10:22:13 -------------------------------------------------------------------------------- Patient/Caregiver Education Details Patient Name: Date of Service: Eugenia Mcalpine D. 4/13/2022andnbsp8:00 A M Medical Record Number: [id number] Patient Account Number: [id number] Date of Birth/Gender: Treating RN: 04-09-1961 (60 y.o. [name] Primary Care Physician: [name] Other Clinician: Referring Physician: Treating Physician/Extender: [name], [name] in Treatment: 2 Education Assessment Education Provided To: Patient Education Topics Provided Hyperbaric Oxygenation: Handouts: Hyperbaric Oxygen Methods: Explain/Verbal Responses: Reinforcements needed Electronic Signature(s) Signed: 08/31/2020 12:26:47 PM By: [name] Entered By: [name] on 08/31/2020 10:22:02 -------------------------------------------------------------------------------- Vitals Details Patient Name: Date of Service: [name], [name] 08/31/2020 8:00 A M Medical Record Number: [id number] Patient Account Number: [id number] Date of Birth/Sex: Treating RN: 1960/05/31 (60 y.o. [name], [name] Primary Care Teddie Mehta: [name] Other Clinician: Referring Keenan Dimitrov: Treating Amariona Rathje/Extender: [name], [name] in Treatment: 2 Vital Signs Time Taken: 07:51 Temperature (F): 97.4 Height (in): 65 Pulse (bpm): 63 Weight (lbs): 175 Respiratory Rate (breaths/min): 18 Body Mass Index (BMI): 29.1 Blood Pressure (mmHg): 139/90 Reference Range: 80 - 120 mg / dl Electronic Signature(s) Signed: 08/31/2020 12:26:47 PM By: [name] Entered By: [name] on 08/31/2020 10:07:28

## 2020-09-01 ENCOUNTER — Encounter (HOSPITAL_BASED_OUTPATIENT_CLINIC_OR_DEPARTMENT_OTHER): Payer: Medicare Other | Admitting: Internal Medicine

## 2020-09-01 ENCOUNTER — Other Ambulatory Visit: Payer: Self-pay

## 2020-09-01 DIAGNOSIS — M272 Inflammatory conditions of jaws: Secondary | ICD-10-CM | POA: Diagnosis not present

## 2020-09-01 NOTE — Progress Notes (Signed)
ARIES, KASA ([id number]) Visit Report for 09/01/2020 SuperBill Details Patient Name: Date of Service: [place], [name] 09/01/2020 Medical Record Number: [id number] Patient Account Number: [id number] Date of Birth/Sex: Treating RN: [birth year] (60 y.o. 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

## 2020-09-01 NOTE — Progress Notes (Signed)
[name], [name] ([id number]) Visit Report for 09/01/2020 HBO Details Patient Name: Date of Service: [place], [name] 09/01/2020 8:00 A M Medical Record Number: [id number] Patient Account Number: [id number] Date of Birth/Sex: Treating RN: August 26, 1960 (60 y.o. 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 HBO Treatment Details Treatment Number: 14 Patient Type: Outpatient Chamber Type: Monoplace Chamber Serial #: [id number] Treatment Protocol: 2.5 ATA with 90 minutes oxygen, with two 5 minute air breaks Treatment Details Compression Rate Down: 2.0 psi / minute De-Compression Rate Up: 2.0 psi / minute A breaks and breathing ir Compress Tx Pressure periods Decompress Decompress Begins Reached (leave unused spaces Begins Ends blank) Chamber Pressure (ATA 1 2.5 2.5 2.5 2.5 2.5 - - 2.5 1 ) Clock Time (24 hr) 07:59 08:11 08:41 08:46 09:16 09:21 - - 09:51 10:03 Treatment Length: 124 (minutes) Treatment Segments: 4 Vital Signs Capillary Blood Glucose Reference Range: 80 - 120 mg / dl HBO Diabetic Blood Glucose Intervention Range: <131 mg/dl or >249 mg/dl Time Vitals Blood Respiratory Capillary Blood Glucose Pulse Action Type: Pulse: Temperature: Taken: Pressure: Rate: Glucose (mg/dl): Meter #: Oximetry (%) Taken: Pre 07:52 136/64 71 18 97.7 Post 10:03 147/83 62 18 98.3 Treatment Response Treatment Completion Status: Treatment Completed without Adverse Event Alaila Pillard Notes No concerns with treatment given Physician HBO Attestation: I certify that I supervised this HBO treatment in accordance with Medicare guidelines. A trained emergency  response team is readily available per Yes hospital policies and procedures. Continue HBOT as ordered. Yes Electronic Signature(s) Signed: 09/01/2020 5:23:41 PM By: Linton Ham MD Entered By: Linton Ham on 09/01/2020 14:59:19 -------------------------------------------------------------------------------- HBO Safety Checklist Details Patient Name: Date of Service: Nancy [name] NA D. 09/01/2020 8:00 A M Medical Record Number: [id number] Patient Account Number: [id number] Date of Birth/Sex: Treating RN: Aug 19, 1960 (60 y.o. Nancy [name] Primary Care Breland Elders: [name] Other Clinician: Referring Brittiany Wiehe: Treating Enzley Kitchens/Extender: Rosana Hoes, [name] in Treatment: 3 HBO Safety Checklist Items Safety Checklist Consent Form Signed Patient voided / foley secured and emptied When did you last eato 0630 Last dose of injectable or oral agent na Ostomy pouch emptied and vented if applicable NA All implantable devices assessed, documented and approved NA Intravenous access site secured and place NA Valuables secured Linens and cotton and cotton/polyester blend (less than 51% polyester) Personal oil-based products / skin lotions / body lotions removed Wigs or hairpieces removed Smoking or tobacco materials removed Books / newspapers / magazines / loose paper removed Cologne, aftershave, perfume and deodorant removed Jewelry removed (may wrap wedding band) Make-up removed Hair care products removed Battery operated devices (external) removed Heating patches and chemical warmers removed Titanium eyewear removed Nail polish cured greater than 10 hours Casting material cured greater than 10 hours NA Hearing aids removed NA Loose dentures or partials removed NA Prosthetics have been removed NA Patient demonstrates correct use of air break device (if applicable) Patient concerns have been addressed Patient grounding bracelet on and cord  attached to chamber Specifics for Inpatients (complete in addition to above) Medication sheet sent with patient NA Intravenous medications needed or due during therapy sent with patient NA Drainage tubes (e.g. nasogastric tube or chest tube secured and vented) NA Endotracheal or Tracheotomy tube secured NA Cuff deflated of  air and inflated with saline NA Airway suctioned NA Electronic Signature(s) Signed: 09/01/2020 6:17:09 PM By: Levan Hurst RN, BSN Entered By: Levan Hurst on 09/01/2020 10:03:26

## 2020-09-01 NOTE — Progress Notes (Signed)
MIRINDA, MONTE ([id number]) Visit Report for 09/01/2020 Arrival Information Details Patient Name: Date of Service: [place], [name] 09/01/2020 8:00 A M Medical Record Number: [id number] Patient Account Number: [id number] Date of Birth/Sex: Treating RN: April 15, 1961 (60 y.o. Nancy Fetter Primary Care Jandel Patriarca: [name] Other Clinician: Referring Rosmary Dionisio: Treating Kilo Eshelman/Extender: Rosana Hoes, [name] in Treatment: 3 Visit Information History Since Last Visit Added or deleted any medications: No Patient Arrived: Ambulatory Any new allergies or adverse reactions: No Arrival Time: 07:52 Had a fall or experienced change in No Accompanied By: alone activities of daily living that may affect Transfer Assistance: None risk of falls: Patient Identification Verified: Yes Signs or symptoms of abuse/neglect since last visito No Secondary Verification Process Completed: Yes Hospitalized since last visit: No Patient Requires Transmission-Based Precautions: No Implantable device outside of the clinic excluding No Patient Has Alerts: No cellular tissue based products placed in the center since last visit: Pain Present Now: No Electronic Signature(s) Signed: 09/01/2020 6:17:09 PM By: Levan Hurst RN, BSN Entered By: Levan Hurst on 09/01/2020 10:02:18 -------------------------------------------------------------------------------- Encounter Discharge Information Details Patient Name: Date of Service: [name] 09/01/2020 8:00 A M Medical Record Number: [id number] Patient Account Number: [id number] Date of Birth/Sex: Treating RN: 03-05-1961 (60 y.o. Nancy Fetter Primary Care Corie Allis: [name] Other Clinician: Referring Lucillie Kiesel: Treating Taila Basinski/Extender: Cleophas Dunker in Treatment: 3 Encounter Discharge Information Items Discharge Condition: Stable Ambulatory Status: Ambulatory Discharge Destination:  Home Transportation: Private Auto Accompanied By: alone Schedule Follow-up Appointment: Yes Clinical Summary of Care: Patient Declined Electronic Signature(s) Signed: 09/01/2020 6:17:09 PM By: Levan Hurst RN, BSN Entered By: Levan Hurst on 09/01/2020 10:49:11 -------------------------------------------------------------------------------- Vitals Details Patient Name: Date of Service: [name], [name] 09/01/2020 8:00 A M Medical Record Number: [id number] Patient Account Number: [id number] Date of Birth/Sex: Treating RN: [birth year] (60 y.o. Nancy Fetter Primary Care Merranda Bolls: [name] Other Clinician: Referring Charleene Callegari: Treating Tinley Rought/Extender: Rosana Hoes, [name] in Treatment: 3 Vital Signs Time Taken: 07:52 Temperature (F): 97.7 Height (in): 65 Pulse (bpm): 71 Weight (lbs): 175 Respiratory Rate (breaths/min): 18 Body Mass Index (BMI): 29.1 Blood Pressure (mmHg): 136/64 Reference Range: 80 - 120 mg / dl Electronic Signature(s) Signed: 09/01/2020 6:17:09 PM By: Levan Hurst RN, BSN Entered By: Levan Hurst on 09/01/2020 10:02:37

## 2020-09-02 ENCOUNTER — Encounter (HOSPITAL_BASED_OUTPATIENT_CLINIC_OR_DEPARTMENT_OTHER): Payer: Medicare Other | Admitting: Internal Medicine

## 2020-09-02 DIAGNOSIS — M272 Inflammatory conditions of jaws: Secondary | ICD-10-CM | POA: Diagnosis not present

## 2020-09-05 ENCOUNTER — Other Ambulatory Visit: Payer: Self-pay

## 2020-09-05 ENCOUNTER — Encounter (HOSPITAL_BASED_OUTPATIENT_CLINIC_OR_DEPARTMENT_OTHER): Payer: Medicare Other | Admitting: Internal Medicine

## 2020-09-05 DIAGNOSIS — M272 Inflammatory conditions of jaws: Secondary | ICD-10-CM | POA: Diagnosis not present

## 2020-09-05 NOTE — Progress Notes (Signed)
WILLISTINE, FERRALL ([id number]) Visit Report for 09/05/2020 SuperBill Details Patient Name: Date of Service: [place], [name] 09/05/2020 Medical Record Number: [id number] Patient Account Number: [id number] Date of Birth/Sex: Treating RN: 01-01-1961 (60 y.o. 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 Encounter for antineoplastic radiation therapy L59.8 Other specified disorders of the skin and subcutaneous tissue related to radiation Facility Procedures CPT4 Code Description Modifier Quantity [id number] G0277-(Facility Use Only) HBOT full body chamber, 2min , 4 Physician Procedures Quantity CPT4 Code Description Modifier [id number] 22297 - WC PHYS HYPERBARIC OXYGEN THERAPY 1 ICD-10 Diagnosis Description 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

## 2020-09-05 NOTE — Progress Notes (Signed)
Nancy, [name] ([id number]) Visit Report for 09/02/2020 HBO Details Patient Name: Date of Service: [place], [name] 09/02/2020 8:00 A M Medical Record Number: [id number] Patient Account Number: [id number] Date of Birth/Sex: Treating RN: 14-Mar-1961 (60 y.o. 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 HBO Treatment Details Treatment Number: 15 Patient Type: Outpatient Chamber Type: Monoplace Chamber Serial #: [id number] Treatment Protocol: 2.5 ATA with 90 minutes oxygen, with two 5 minute air breaks Treatment Details Compression Rate Down: 2.0 psi / minute De-Compression Rate Up: 2.5 psi / minute A breaks and breathing ir Compress Tx Pressure periods Decompress Decompress Begins Reached (leave unused spaces Begins Ends blank) Chamber Pressure (ATA 1 2.5 2.5 2.5 2.5 2.5 - - 2.5 1 ) Clock Time (24 hr) 08:07 08:19 08:49 08:54 09:24 09:29 - - 09:59 10:09 Treatment Length: 122 (minutes) Treatment Segments: 4 Vital Signs Capillary Blood Glucose Reference Range: 80 - 120 mg / dl HBO Diabetic Blood Glucose Intervention Range: <131 mg/dl or >249 mg/dl Time Vitals Blood Respiratory Capillary Blood Glucose Pulse Action Type: Pulse: Temperature: Taken: Pressure: Rate: Glucose (mg/dl): Meter #: Oximetry (%) Taken: Pre 07:55 141/85 68 16 98.1 Post 10:09 166/66 61 18 98.2 Treatment Response Treatment Completion Status: Treatment Completed without Adverse Event Lorianne Malbrough Notes No concerns with treatment given Physician HBO Attestation: I certify that I supervised this HBO treatment in accordance with Medicare guidelines. A trained emergency  response team is readily available per Yes hospital policies and procedures. Continue HBOT as ordered. Yes Electronic Signature(s) Signed: 09/02/2020 4:57:27 PM By: Linton Ham MD Entered By: Linton Ham on 09/02/2020 15:47:57 -------------------------------------------------------------------------------- HBO Safety Checklist Details Patient Name: Date of Service: Nancy [name] NA D. 09/02/2020 8:00 A M Medical Record Number: [id number] Patient Account Number: [id number] Date of Birth/Sex: Treating RN: [birth year] (60 y.o. Nancy [name] Primary Care Bryon Parker: [name] Other Clinician: Referring Blessings Inglett: Treating Marleen Moret/Extender: Rosana Hoes, [name] in Treatment: 3 HBO Safety Checklist Items Safety Checklist Consent Form Signed Patient voided / foley secured and emptied When did you last eato 0630 Last dose of injectable or oral agent na Ostomy pouch emptied and vented if applicable NA All implantable devices assessed, documented and approved NA Intravenous access site secured and place NA Valuables secured Linens and cotton and cotton/polyester blend (less than 51% polyester) Personal oil-based products / skin lotions / body lotions removed Wigs or hairpieces removed Smoking or tobacco materials removed Books / newspapers / magazines / loose paper removed Cologne, aftershave, perfume and deodorant removed Jewelry removed (may wrap wedding band) Make-up removed Hair care products removed Battery operated devices (external) removed Heating patches and chemical warmers removed Titanium eyewear removed Nail polish cured greater than 10 hours Casting material cured greater than 10 hours NA Hearing aids removed NA Loose dentures or partials removed NA Prosthetics have been removed NA Patient demonstrates correct use of air break device (if applicable) Patient concerns have been addressed Patient grounding bracelet on and cord  attached to chamber Specifics for Inpatients (complete in addition to above) Medication sheet sent with patient NA Intravenous medications needed or due during therapy sent with patient NA Drainage tubes (e.g. nasogastric tube or chest tube secured and vented) NA Endotracheal or Tracheotomy tube secured NA Cuff deflated of  air and inflated with saline NA Airway suctioned NA Electronic Signature(s) Signed: 09/05/2020 5:39:31 PM By: Levan Hurst RN, BSN Entered By: Levan Hurst on 09/02/2020 10:09:15

## 2020-09-05 NOTE — Progress Notes (Signed)
HADIA, MINIER ([id number]) Visit Report for 09/05/2020 Arrival Information Details Patient Name: Date of Service: [place], [name] 09/05/2020 8:00 A M Medical Record Number: [id number] Patient Account Number: [id number] Date of Birth/Sex: Treating RN: 12-03-1960 (60 y.o. [name], [name] Primary Care Marlene Beidler: [name] Other Clinician: Referring Harrison Zetina: Treating Lennyn Gange/Extender: Cleophas Dunker in Treatment: 3 Visit Information History Since Last Visit Added or deleted any medications: No Patient Arrived: Ambulatory Any new allergies or adverse reactions: No Arrival Time: 07:53 Had a fall or experienced change in No Accompanied By: self activities of daily living that may affect Transfer Assistance: None risk of falls: Patient Identification Verified: Yes Signs or symptoms of abuse/neglect since last visito No Secondary Verification Process Completed: Yes Hospitalized since last visit: No Patient Requires Transmission-Based Precautions: No Implantable device outside of the clinic excluding No Patient Has Alerts: No cellular tissue based products placed in the center since last visit: Pain Present Now: No Electronic Signature(s) Signed: 09/05/2020 5:05:09 PM By: [name] Entered By: [name] on 09/05/2020 10:46:11 -------------------------------------------------------------------------------- Encounter Discharge Information Details Patient Name: Date of Service: [name], [name] 09/05/2020 8:00 A M Medical Record Number: [id number] Patient Account Number: [id number] Date of Birth/Sex: Treating RN: 01-04-1961 (61 y.o. [name] Primary Care Jim Lundin: [name] Other Clinician: Referring Saydee Zolman: Treating Janequa Kipnis/Extender: Cleophas Dunker in Treatment: 3 Encounter Discharge Information Items Discharge Condition: Stable Ambulatory Status: Ambulatory Discharge Destination:  Home Transportation: Private Auto Accompanied By: self Schedule Follow-up Appointment: Yes Clinical Summary of Care: Electronic Signature(s) Signed: 09/05/2020 5:05:09 PM By: [name] Entered By: [name] on 09/05/2020 10:48:09 -------------------------------------------------------------------------------- Vitals Details Patient Name: Date of Service: [name], [name] 09/05/2020 8:00 A M Medical Record Number: [id number] Patient Account Number: [id number] Date of Birth/Sex: Treating RN: [birth year] (60 y.o. [name], [name] Primary Care Iziah Cates: [name] Other Clinician: Referring Mcarthur Ivins: Treating Jayce Boyko/Extender: Rosana Hoes, [name] in Treatment: 3 Vital Signs Time Taken: 07:53 Temperature (F): 98 Height (in): 65 Pulse (bpm): 66 Weight (lbs): 175 Respiratory Rate (breaths/min): 18 Body Mass Index (BMI): 29.1 Blood Pressure (mmHg): 136/79 Reference Range: 80 - 120 mg / dl Electronic Signature(s) Signed: 09/05/2020 5:05:09 PM By: [name] Entered By: [name] on 09/05/2020 10:46:24

## 2020-09-05 NOTE — Progress Notes (Signed)
KOLBI, TOFTE ([id number]) Visit Report for 09/02/2020 Arrival Information Details Patient Name: Date of Service: [place], [name] 09/02/2020 8:00 A M Medical Record Number: [id number] Patient Account Number: [id number] Date of Birth/Sex: Treating RN: 1960-11-28 (60 y.o. Nancy Fetter Primary Care Daison Braxton: [name] Other Clinician: Referring Stormie Ventola: Treating Nickisha Hum/Extender: Rosana Hoes, [name] in Treatment: 3 Visit Information History Since Last Visit Added or deleted any medications: No Patient Arrived: Ambulatory Any new allergies or adverse reactions: No Arrival Time: 07:55 Had a fall or experienced change in No Accompanied By: alone activities of daily living that may affect Transfer Assistance: None risk of falls: Patient Identification Verified: Yes Signs or symptoms of abuse/neglect since last visito No Secondary Verification Process Completed: Yes Hospitalized since last visit: No Patient Requires Transmission-Based Precautions: No Implantable device outside of the clinic excluding No Patient Has Alerts: No cellular tissue based products placed in the center since last visit: Pain Present Now: No Electronic Signature(s) Signed: 09/05/2020 5:39:31 PM By: Levan Hurst RN, BSN Entered By: Levan Hurst on 09/02/2020 10:08:27 -------------------------------------------------------------------------------- Encounter Discharge Information Details Patient Name: Date of Service: [name] 09/02/2020 8:00 A M Medical Record Number: [id number] Patient Account Number: [id number] Date of Birth/Sex: Treating RN: 1960-07-10 (60 y.o. Nancy Fetter Primary Care Keenon Leitzel: [name] Other Clinician: Referring Sencere Symonette: Treating Kyden Potash/Extender: Cleophas Dunker in Treatment: 3 Encounter Discharge Information Items Discharge Condition: Stable Ambulatory Status: Ambulatory Discharge Destination:  Home Transportation: Private Auto Accompanied By: alone Schedule Follow-up Appointment: Yes Clinical Summary of Care: Patient Declined Electronic Signature(s) Signed: 09/05/2020 5:39:31 PM By: Levan Hurst RN, BSN Entered By: Levan Hurst on 09/02/2020 15:23:10 -------------------------------------------------------------------------------- Vitals Details Patient Name: Date of Service: [name], [name] 09/02/2020 8:00 A M Medical Record Number: [id number] Patient Account Number: [id number] Date of Birth/Sex: Treating RN: 09/06/1960 (60 y.o. Nancy Fetter Primary Care Britanny Marksberry: [name] Other Clinician: Referring Charly Hunton: Treating Hiral Lukasiewicz/Extender: Rosana Hoes, [name] in Treatment: 3 Vital Signs Time Taken: 07:55 Temperature (F): 98.1 Height (in): 65 Pulse (bpm): 68 Weight (lbs): 175 Respiratory Rate (breaths/min): 16 Body Mass Index (BMI): 29.1 Blood Pressure (mmHg): 141/85 Reference Range: 80 - 120 mg / dl Electronic Signature(s) Signed: 09/05/2020 5:39:31 PM By: Levan Hurst RN, BSN Entered By: Levan Hurst on 09/02/2020 10:08:43

## 2020-09-05 NOTE — Progress Notes (Signed)
XOIE, KREUSER ([id number]) Visit Report for 09/02/2020 SuperBill Details Patient Name: Date of Service: [place], [name] 09/02/2020 Medical Record Number: [id number] Patient Account Number: [id number] Date of Birth/Sex: Treating RN: Sep 08, 1960 (60 y.o. 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

## 2020-09-05 NOTE — Progress Notes (Signed)
JANIAH, DEVINNEY ([id number]) Visit Report for 09/05/2020 HBO Details Patient Name: Date of Service: [place], [name] 09/05/2020 8:00 A M Medical Record Number: [id number] Patient Account Number: [id number] Date of Birth/Sex: Treating RN: April 05, 1961 (60 y.o. 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 HBO Treatment Details Treatment Number: 16 Patient Type: Outpatient Chamber Type: Monoplace Chamber Serial #: [id number] Treatment Protocol: 2.5 ATA with 90 minutes oxygen, with two 5 minute air breaks Treatment Details Compression Rate Down: 2.0 psi / minute De-Compression Rate Up: 2.0 psi / minute A breaks and breathing ir Compress Tx Pressure periods Decompress Decompress Begins Reached (leave unused spaces Begins Ends blank) Chamber Pressure (ATA 1 2.5 2.5 2.5 2.5 2.5 - - 2.5 1 ) Clock Time (24 hr) 08:03 08:14 08:44 08:49 09:19 09:24 - - 09:54 10:04 Treatment Length: 121 (minutes) Treatment Segments: 4 Vital Signs Capillary Blood Glucose Reference Range: 80 - 120 mg / dl HBO Diabetic Blood Glucose Intervention Range: <131 mg/dl or >249 mg/dl Time Vitals Blood Respiratory Capillary Blood Glucose Pulse Action Type: Pulse: Temperature: Taken: Pressure: Rate: Glucose (mg/dl): Meter #: Oximetry (%) Taken: Pre 07:53 136/79 66 18 98 Post 10:06 145/77 57 16 98.2 Treatment Response Treatment Toleration: Well Treatment Completion Status: Treatment Completed without Adverse Event Khyron Garno Notes No concerns with treatment given Physician HBO Attestation: I certify that I supervised this HBO treatment in accordance with  Medicare guidelines. A trained emergency response team is readily available per Yes hospital policies and procedures. Continue HBOT as ordered. Yes Electronic Signature(s) Signed: 09/05/2020 5:05:50 PM By: Linton Ham MD Entered By: Linton Ham on 09/05/2020 17:03:58 -------------------------------------------------------------------------------- HBO Safety Checklist Details Patient Name: Date of Service: [name] 09/05/2020 8:00 A M Medical Record Number: [id number] Patient Account Number: [id number] Date of Birth/Sex: Treating RN: [birth year] (60 y.o. [name], [name] Primary Care Alaynna Kerwood: [name] Other Clinician: Referring Kruze Atchley: Treating Jolon Degante/Extender: Rosana Hoes, [name] in Treatment: 3 HBO Safety Checklist Items Safety Checklist Consent Form Signed Patient voided / foley secured and emptied When did you last eato last night Last dose of injectable or oral agent n/a Ostomy pouch emptied and vented if applicable NA All implantable devices assessed, documented and approved NA Intravenous access site secured and place NA Valuables secured Linens and cotton and cotton/polyester blend (less than 51% polyester) Personal oil-based products / skin lotions / body lotions removed NA Wigs or hairpieces removed NA Smoking or tobacco materials removed NA Books / newspapers / magazines / loose paper removed NA Cologne, aftershave, perfume and deodorant removed NA Jewelry removed (may wrap wedding band) Make-up removed NA Hair care products removed NA Battery operated devices (external) removed NA Heating patches and chemical warmers removed NA Titanium eyewear removed NA Nail polish cured greater than 10 hours NA Casting material cured greater than 10 hours NA Hearing aids removed NA Loose dentures or partials removed NA Prosthetics have been removed NA Patient demonstrates correct use of air break device (if  applicable) Patient concerns have been addressed Patient grounding bracelet on and cord attached to chamber Specifics for Inpatients (complete in addition to above) Medication sheet sent with patient Intravenous medications needed or due during therapy sent with patient Drainage tubes (e.g. nasogastric tube or chest tube  secured and vented) Endotracheal or Tracheotomy tube secured Cuff deflated of air and inflated with saline Airway suctioned Electronic Signature(s) Signed: 09/05/2020 5:05:09 PM By: [name] Entered By: [name] on 09/05/2020 10:46:50

## 2020-09-06 ENCOUNTER — Encounter (HOSPITAL_BASED_OUTPATIENT_CLINIC_OR_DEPARTMENT_OTHER): Payer: Medicare Other | Admitting: Internal Medicine

## 2020-09-06 DIAGNOSIS — M272 Inflammatory conditions of jaws: Secondary | ICD-10-CM | POA: Diagnosis not present

## 2020-09-07 ENCOUNTER — Other Ambulatory Visit: Payer: Self-pay

## 2020-09-07 ENCOUNTER — Encounter (HOSPITAL_BASED_OUTPATIENT_CLINIC_OR_DEPARTMENT_OTHER): Payer: Medicare Other | Admitting: Physician Assistant

## 2020-09-07 DIAGNOSIS — M272 Inflammatory conditions of jaws: Secondary | ICD-10-CM | POA: Diagnosis not present

## 2020-09-07 NOTE — Progress Notes (Signed)
[name], [name] ([id number]) Visit Report for 09/07/2020 HBO Details Patient Name: Date of Service: [place], [name] 09/07/2020 8:00 A M Medical Record Number: [id number] Patient Account Number: [id number] Date of Birth/Sex: Treating RN: July 30, 1960 (60 y.o. 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 HBO Treatment Details Treatment Number: 18 Patient Type: Outpatient Chamber Type: Monoplace Chamber Serial #: [id number] Treatment Protocol: 2.5 ATA with 90 minutes oxygen, with two 5 minute air breaks Treatment Details Compression Rate Down: 2.0 psi / minute De-Compression Rate Up: 2.0 psi / minute A breaks and breathing ir Compress Tx Pressure periods Decompress Decompress Begins Reached (leave unused spaces Begins Ends blank) Chamber Pressure (ATA 1 2.5 2.5 2.5 2.5 2.5 - - 2.5 1 ) Clock Time (24 hr) 08:03 08:14 08:44 09:49 09:19 09:24 - - 09:54 10:05 Treatment Length: 122 (minutes) Treatment Segments: 4 Vital Signs Capillary Blood Glucose Reference Range: 80 - 120 mg / dl HBO Diabetic Blood Glucose Intervention Range: <131 mg/dl or >249 mg/dl Time Vitals Blood Respiratory Capillary Blood Glucose Pulse Action Type: Pulse: Temperature: Taken: Pressure: Rate: Glucose (mg/dl): Meter #: Oximetry (%) Taken: Pre 07:55 133/78 76 16 98.3 Post 10:06 142/84 61 16 97.9 Treatment Response Treatment Toleration: Well Treatment Completion Status: Treatment Completed without Adverse Event Electronic Signature(s) Signed: 09/07/2020 11:32:37 AM By: Worthy Keeler PA-C Signed: 09/07/2020 5:55:25 PM By: [name] Entered By: [name] on  09/07/2020 10:48:49 -------------------------------------------------------------------------------- HBO Safety Checklist Details Patient Name: Date of Service: [name] 09/07/2020 8:00 A M Medical Record Number: [id number] Patient Account Number: [id number] Date of Birth/Sex: Treating RN: 24-Dec-1960 (60 y.o. Nancy [name], [name] Primary Care Dragon Thrush: [name] Other Clinician: Referring Vanita Cannell: Treating Meerab Maselli/Extender: [name], [name] in Treatment: 3 HBO Safety Checklist Items Safety Checklist Consent Form Signed Patient voided / foley secured and emptied When did you last eato last night Last dose of injectable or oral agent n/a Ostomy pouch emptied and vented if applicable NA All implantable devices assessed, documented and approved NA Intravenous access site secured and place NA Valuables secured Linens and cotton and cotton/polyester blend (less than 51% polyester) Personal oil-based products / skin lotions / body lotions removed NA Wigs or hairpieces removed NA Smoking or tobacco materials removed NA Books / newspapers / magazines / loose paper removed NA Cologne, aftershave, perfume and deodorant removed NA Jewelry removed (may wrap wedding band) Make-up removed NA Hair care products removed NA Battery operated devices (external) removed NA Heating patches and chemical warmers removed NA Titanium eyewear removed Nail polish cured greater than 10 hours NA Casting material cured greater than 10 hours NA Hearing aids removed NA Loose dentures or partials removed NA Prosthetics have been removed NA Patient demonstrates correct use of air break device (if applicable) Patient concerns have been addressed Patient grounding bracelet on and cord attached to chamber Specifics for Inpatients (complete in addition to above) Medication sheet sent with patient Intravenous medications needed or due during therapy sent with  patient Drainage tubes (e.g. nasogastric tube or chest tube secured and vented) Endotracheal or Tracheotomy tube secured Cuff deflated of air and inflated with saline Airway suctioned Electronic Signature(s) Signed: 09/07/2020 5:55:25 PM By: [name] Entered By: [name] on 09/07/2020  10:43:28 

## 2020-09-07 NOTE — Progress Notes (Signed)
Nancy [name], Nancy [name] ([id number]) Visit Report for 09/07/2020 Arrival Information Details Patient Name: Date of Service: [place], [name] 09/07/2020 8:00 A M Medical Record Number: [id number] Patient Account Number: [id number] Date of Birth/Sex: Treating RN: February 08, 1961 (60 y.o. [name], [name] Primary Care Raseel Jans: [name] Other Clinician: Referring Shahram Alexopoulos: Treating Nazirah Tri/Extender: [name], [name] in Treatment: 3 Visit Information History Since Last Visit Added or deleted any medications: No Patient Arrived: Ambulatory Any new allergies or adverse reactions: No Arrival Time: 07:55 Had a fall or experienced change in No Accompanied By: self activities of daily living that may affect Transfer Assistance: None risk of falls: Patient Identification Verified: Yes Signs or symptoms of abuse/neglect since last visito No Secondary Verification Process Completed: Yes Hospitalized since last visit: No Patient Requires Transmission-Based Precautions: No Implantable device outside of the clinic excluding No Patient Has Alerts: No cellular tissue based products placed in the center since last visit: Pain Present Now: No Electronic Signature(s) Signed: 09/07/2020 5:55:25 PM By: [name] Entered By: [name] on 09/07/2020 10:42:52 -------------------------------------------------------------------------------- Encounter Discharge Information Details Patient Name: Date of Service: Nancy [name] NA D. 09/07/2020 8:00 A M Medical Record Number: [id number] Patient Account Number: [id number] Date of Birth/Sex: Treating RN: 11-21-1960 (60 y.o. [name] Primary Care Finlay Godbee: [name] Other Clinician: Referring Aser Nylund: Treating Makalya Nave/Extender: [name], [name] in Treatment: 3 Encounter Discharge Information Items Discharge Condition: Stable Ambulatory Status: Ambulatory Discharge Destination:  Home Transportation: Private Auto Accompanied By: self Schedule Follow-up Appointment: Yes Clinical Summary of Care: Electronic Signature(s) Signed: 09/07/2020 5:55:25 PM By: [name] Entered By: [name] on 09/07/2020 10:49:24 -------------------------------------------------------------------------------- Vitals Details Patient Name: Date of Service: Nancy [name], [name] 09/07/2020 8:00 A M Medical Record Number: [id number] Patient Account Number: [id number] Date of Birth/Sex: Treating RN: Apr 30, 1961 (60 y.o. [name], [name] Primary Care Xavia Kniskern: [name] Other Clinician: Referring Ranulfo Kall: Treating Lyndi Holbein/Extender: [name], [name] in Treatment: 3 Vital Signs Time Taken: 07:55 Temperature (F): 98.3 Height (in): 65 Pulse (bpm): 76 Weight (lbs): 175 Respiratory Rate (breaths/min): 16 Body Mass Index (BMI): 29.1 Blood Pressure (mmHg): 133/78 Reference Range: 80 - 120 mg / dl Electronic Signature(s) Signed: 09/07/2020 5:55:25 PM By: [name] Entered By: [name] on 09/07/2020 10:43:04

## 2020-09-07 NOTE — Progress Notes (Signed)
MARGOT, ORIORDAN ([id number]) Visit Report for 09/07/2020 SuperBill Details Patient Name: Date of Service: Nancy [name], Nancy [name] 09/07/2020 Medical Record Number: [id number] Patient Account Number: [id number] Date of Birth/Sex: Treating RN: Jan 03, 1961 (60 y.o. 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 Facility Procedures CPT4 Code Description Modifier Quantity [id number] G0277-(Facility Use Only) HBOT full body chamber, 68min , 4 Physician Procedures Quantity CPT4 Code Description Modifier [id number] 12929 - WC PHYS HYPERBARIC OXYGEN THERAPY 1 ICD-10 Diagnosis Description 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

## 2020-09-08 ENCOUNTER — Other Ambulatory Visit: Payer: Self-pay

## 2020-09-08 ENCOUNTER — Encounter (HOSPITAL_BASED_OUTPATIENT_CLINIC_OR_DEPARTMENT_OTHER): Payer: Medicare Other | Admitting: Internal Medicine

## 2020-09-08 DIAGNOSIS — M272 Inflammatory conditions of jaws: Secondary | ICD-10-CM | POA: Diagnosis not present

## 2020-09-08 NOTE — Progress Notes (Signed)
SAMIA, KUKLA ([id number]) Visit Report for 09/08/2020 Arrival Information Details Patient Name: Date of Service: [place], [name] 09/08/2020 8:00 A M Medical Record Number: [id number] Patient Account Number: [id number] Date of Birth/Sex: Treating RN: February 10, 1961 (60 y.o. Nancy Fetter Primary Care Ahja Martello: [name] Other Clinician: Referring Juaquina Machnik: Treating Aslyn Cottman/Extender: Claris Gladden in Treatment: 4 Visit Information History Since Last Visit Added or deleted any medications: No Patient Arrived: Ambulatory Any new allergies or adverse reactions: No Arrival Time: 08:00 Had a fall or experienced change in No Accompanied By: alone activities of daily living that may affect Transfer Assistance: None risk of falls: Patient Identification Verified: Yes Signs or symptoms of abuse/neglect since last visito No Secondary Verification Process Completed: Yes Hospitalized since last visit: No Patient Requires Transmission-Based Precautions: No Implantable device outside of the clinic excluding No Patient Has Alerts: No cellular tissue based products placed in the center since last visit: Pain Present Now: No Electronic Signature(s) Signed: 09/08/2020 5:27:18 PM By: Levan Hurst RN, BSN Entered By: Levan Hurst on 09/08/2020 11:52:47 -------------------------------------------------------------------------------- Encounter Discharge Information Details Patient Name: Date of Service: [name] 09/08/2020 8:00 A M Medical Record Number: [id number] Patient Account Number: [id number] Date of Birth/Sex: Treating RN: 21-Dec-1960 (60 y.o. Nancy Fetter Primary Care Marshawn Normoyle: [name] Other Clinician: Referring Tianni Escamilla: Treating Raima Geathers/Extender: Claris Gladden in Treatment: 4 Encounter Discharge Information Items Discharge Condition: Stable Ambulatory Status: Ambulatory Discharge Destination:  Home Transportation: Private Auto Accompanied By: alone Schedule Follow-up Appointment: Yes Clinical Summary of Care: Patient Declined Electronic Signature(s) Signed: 09/08/2020 5:27:18 PM By: Levan Hurst RN, BSN Entered By: Levan Hurst on 09/08/2020 11:55:38 -------------------------------------------------------------------------------- Vitals Details Patient Name: Date of Service: [name], [name] 09/08/2020 8:00 A M Medical Record Number: [id number] Patient Account Number: [id number] Date of Birth/Sex: Treating RN: 30-Dec-1960 (60 y.o. Nancy Fetter Primary Care Dasie Chancellor: [name] Other Clinician: Referring Leasha Goldberger: Treating Taysha Majewski/Extender: Claris Gladden in Treatment: 4 Vital Signs Time Taken: 08:00 Temperature (F): 98.1 Height (in): 65 Pulse (bpm): 68 Weight (lbs): 175 Respiratory Rate (breaths/min): 16 Body Mass Index (BMI): 29.1 Blood Pressure (mmHg): 159/64 Reference Range: 80 - 120 mg / dl Electronic Signature(s) Signed: 09/08/2020 5:27:18 PM By: Levan Hurst RN, BSN Entered By: Levan Hurst on 09/08/2020 11:53:06

## 2020-09-08 NOTE — Progress Notes (Signed)
Nancy [name], Nancy [name] ([id number]) Visit Report for 09/06/2020 Arrival Information Details Patient Name: Date of Service: [place], [name] 09/06/2020 8:00 A M Medical Record Number: [id number] Patient Account Number: [id number] Date of Birth/Sex: Treating RN: May 04, 1961 (60 y.o. Nancy [name] Primary Care Nancy [name]: Nancy [name] Other Clinician: Referring Nancy [name]: Treating Nancy [name]/Extender: Nancy [name] in Treatment: 3 Visit Information History Since Last Visit Added or deleted any medications: No Patient Arrived: Ambulatory Any new allergies or adverse reactions: No Arrival Time: 07:58 Had a fall or experienced change in No Accompanied By: alone activities of daily living that may affect Transfer Assistance: None risk of falls: Patient Identification Verified: Yes Signs or symptoms of abuse/neglect since last visito No Secondary Verification Process Completed: Yes Hospitalized since last visit: No Patient Requires Transmission-Based Precautions: No Implantable device outside of the clinic excluding No Patient Has Alerts: No cellular tissue based products placed in the center since last visit: Pain Present Now: No Electronic Signature(s) Signed: 09/08/2020 5:27:18 PM By: Levan Hurst RN, BSN Entered By: Levan Hurst on 09/06/2020 11:11:26 -------------------------------------------------------------------------------- Encounter Discharge Information Details Patient Name: Date of Service: Nancy [name] NA D. 09/06/2020 8:00 A M Medical Record Number: [id number] Patient Account Number: [id number] Date of Birth/Sex: Treating RN: [birth year] (60 y.o. Nancy [name] Primary Care Nancy [name]: Nancy [name] Other Clinician: Referring Nancy [name]: Treating Nancy [name]/Extender: Nancy [name] in Treatment: 3 Encounter Discharge Information Items Discharge Condition: Stable Ambulatory Status: Ambulatory Discharge Destination:  Home Transportation: Private Auto Accompanied By: alone Schedule Follow-up Appointment: Yes Clinical Summary of Care: Patient Declined Electronic Signature(s) Signed: 09/08/2020 5:27:18 PM By: Levan Hurst RN, BSN Entered By: Levan Hurst on 09/06/2020 11:15:00 -------------------------------------------------------------------------------- Vitals Details Patient Name: Date of Service: Nancy [name], [name] 09/06/2020 8:00 A M Medical Record Number: [id number] Patient Account Number: [id number] Date of Birth/Sex: Treating RN: 1960/08/07 (60 y.o. Nancy [name] Primary Care Nancy [name]: Nancy [name] Other Clinician: Referring Nancy [name]: Treating Nancy [name]/Extender: Nancy [name] in Treatment: 3 Vital Signs Time Taken: 07:58 Temperature (F): 98.1 Height (in): 65 Pulse (bpm): 68 Weight (lbs): 175 Respiratory Rate (breaths/min): 16 Body Mass Index (BMI): 29.1 Blood Pressure (mmHg): 135/77 Reference Range: 80 - 120 mg / dl Electronic Signature(s) Signed: 09/08/2020 5:27:18 PM By: Levan Hurst RN, BSN Entered By: Levan Hurst on 09/06/2020 11:11:43

## 2020-09-09 ENCOUNTER — Other Ambulatory Visit: Payer: Self-pay

## 2020-09-09 ENCOUNTER — Encounter (HOSPITAL_BASED_OUTPATIENT_CLINIC_OR_DEPARTMENT_OTHER): Payer: Medicare Other | Admitting: Internal Medicine

## 2020-09-09 DIAGNOSIS — M272 Inflammatory conditions of jaws: Secondary | ICD-10-CM | POA: Diagnosis not present

## 2020-09-09 NOTE — Progress Notes (Signed)
ARZELLA, REHMANN ([id number]) Visit Report for 09/09/2020 Arrival Information Details Patient Name: Date of Service: [place], [name] 09/09/2020 8:00 A M Medical Record Number: [id number] Patient Account Number: [id number] Date of Birth/Sex: Treating RN: April 07, 1961 (60 y.o. [name], [name] Primary Care Kyrian Stage: [name] Other Clinician: Referring Zaydyn Havey: Treating Jaycelyn Orrison/Extender: Claris Gladden in Treatment: 4 Visit Information History Since Last Visit Added or deleted any medications: No Patient Arrived: Ambulatory Any new allergies or adverse reactions: No Arrival Time: 07:50 Had a fall or experienced change in No Accompanied By: self activities of daily living that may affect Transfer Assistance: None risk of falls: Patient Identification Verified: Yes Signs or symptoms of abuse/neglect since last visito No Secondary Verification Process Completed: Yes Hospitalized since last visit: No Patient Requires Transmission-Based Precautions: No Implantable device outside of the clinic excluding No Patient Has Alerts: No cellular tissue based products placed in the center since last visit: Pain Present Now: No Electronic Signature(s) Signed: 09/09/2020 5:09:10 PM By: [name] Entered By: [name] on 09/09/2020 09:40:22 -------------------------------------------------------------------------------- Encounter Discharge Information Details Patient Name: Date of Service: [name] 09/09/2020 8:00 A M Medical Record Number: [id number] Patient Account Number: [id number] Date of Birth/Sex: Treating RN: 01-Apr-1961 (60 y.o. [name] Primary Care Tyshan Enderle: [name] Other Clinician: Referring Janine Reller: Treating Sheila Ocasio/Extender: Claris Gladden in Treatment: 4 Encounter Discharge Information Items Discharge Condition: Stable Ambulatory Status: Ambulatory Discharge Destination:  Home Transportation: Private Auto Accompanied By: self Schedule Follow-up Appointment: Yes Clinical Summary of Care: Electronic Signature(s) Signed: 09/09/2020 5:09:10 PM By: [name] Entered By: [name] on 09/09/2020 10:30:13 -------------------------------------------------------------------------------- Vitals Details Patient Name: Date of Service: [name], [name] 09/09/2020 8:00 A M Medical Record Number: [id number] Patient Account Number: [id number] Date of Birth/Sex: Treating RN: Jan 06, 1961 (60 y.o. [name], [name] Primary Care Sayra Frisby: [name] Other Clinician: Referring Daundre Biel: Treating Glennis Borger/Extender: Claris Gladden in Treatment: 4 Vital Signs Time Taken: 07:50 Temperature (F): 98.3 Height (in): 65 Pulse (bpm): 72 Weight (lbs): 175 Respiratory Rate (breaths/min): 18 Body Mass Index (BMI): 29.1 Blood Pressure (mmHg): 149/82 Reference Range: 80 - 120 mg / dl Electronic Signature(s) Signed: 09/09/2020 5:09:10 PM By: [name] Entered By: [name] on 09/09/2020 10:28:20

## 2020-09-12 ENCOUNTER — Encounter (HOSPITAL_BASED_OUTPATIENT_CLINIC_OR_DEPARTMENT_OTHER): Payer: Medicare Other | Admitting: Internal Medicine

## 2020-09-12 ENCOUNTER — Other Ambulatory Visit: Payer: Self-pay

## 2020-09-12 DIAGNOSIS — M272 Inflammatory conditions of jaws: Secondary | ICD-10-CM | POA: Diagnosis not present

## 2020-09-12 NOTE — Progress Notes (Signed)
REBEKA, KIMBLE ([id number]) Visit Report for 09/12/2020 Arrival Information Details Patient Name: Date of Service: [place], [name] 09/12/2020 8:00 A M Medical Record Number: [id number] Patient Account Number: [id number] Date of Birth/Sex: Treating RN: 1960/12/08 (61 y.o. [name], [name] Primary Care Barnaby Rippeon: [name] Other Clinician: Referring Vianca Bracher: Treating Tyjuan Demetro/Extender: Claris Gladden in Treatment: 4 Visit Information History Since Last Visit Added or deleted any medications: No Patient Arrived: Ambulatory Any new allergies or adverse reactions: No Arrival Time: 07:50 Had a fall or experienced change in No Accompanied By: self activities of daily living that may affect Transfer Assistance: None risk of falls: Patient Identification Verified: Yes Signs or symptoms of abuse/neglect since last visito No Secondary Verification Process Completed: Yes Hospitalized since last visit: No Patient Requires Transmission-Based Precautions: No Implantable device outside of the clinic excluding No Patient Has Alerts: No cellular tissue based products placed in the center since last visit: Pain Present Now: No Electronic Signature(s) Signed: 09/12/2020 4:05:30 PM By: [name] Entered By: [name] on 09/12/2020 10:10:04 -------------------------------------------------------------------------------- Encounter Discharge Information Details Patient Name: Date of Service: [name] 09/12/2020 8:00 A M Medical Record Number: [id number] Patient Account Number: [id number] Date of Birth/Sex: Treating RN: 1960-06-26 (60 y.o. [name] Primary Care Tramain Gershman: [name] Other Clinician: Referring Lyndell Gillyard: Treating Almira Phetteplace/Extender: Claris Gladden in Treatment: 4 Encounter Discharge Information Items Discharge Condition: Stable Ambulatory Status: Ambulatory Discharge Destination:  Home Transportation: Private Auto Accompanied By: self Schedule Follow-up Appointment: Yes Clinical Summary of Care: Electronic Signature(s) Signed: 09/12/2020 4:05:30 PM By: [name] Entered By: [name] on 09/12/2020 10:26:05 -------------------------------------------------------------------------------- Vitals Details Patient Name: Date of Service: [name], [name] 09/12/2020 8:00 A M Medical Record Number: [id number] Patient Account Number: [id number] Date of Birth/Sex: Treating RN: 24-Mar-1961 (60 y.o. [name], [name] Primary Care Alvie Fowles: [name] Other Clinician: Referring Evett Kassa: Treating Natale Barba/Extender: Claris Gladden in Treatment: 4 Vital Signs Time Taken: 07:50 Temperature (F): 98 Height (in): 65 Pulse (bpm): 78 Weight (lbs): 175 Respiratory Rate (breaths/min): 18 Body Mass Index (BMI): 29.1 Blood Pressure (mmHg): 123/82 Reference Range: 80 - 120 mg / dl Electronic Signature(s) Signed: 09/12/2020 4:05:30 PM By: [name] Entered By: [name] on 09/12/2020 10:10:14

## 2020-09-13 ENCOUNTER — Other Ambulatory Visit: Payer: Self-pay

## 2020-09-13 ENCOUNTER — Encounter (HOSPITAL_BASED_OUTPATIENT_CLINIC_OR_DEPARTMENT_OTHER): Payer: Medicare Other | Admitting: Internal Medicine

## 2020-09-13 DIAGNOSIS — M272 Inflammatory conditions of jaws: Secondary | ICD-10-CM

## 2020-09-13 DIAGNOSIS — L598 Other specified disorders of the skin and subcutaneous tissue related to radiation: Secondary | ICD-10-CM

## 2020-09-13 DIAGNOSIS — Z510 Encounter for antineoplastic radiation therapy: Secondary | ICD-10-CM

## 2020-09-13 NOTE — Progress Notes (Signed)
Nancy [name], Nancy [name] ([id number]) Visit Report for 09/13/2020 Arrival Information Details Patient Name: Date of Service: [place], [name] 09/13/2020 8:00 A M Medical Record Number: [id number] Patient Account Number: [id number] Date of Birth/Sex: Treating RN: 03-May-1961 (60 y.o. [name], [name] Primary Care Ardene Remley: [name] Other Clinician: Referring Severa Jeremiah: Treating Stephen Baruch/Extender: Claris Gladden in Treatment: 4 Visit Information History Since Last Visit Added or deleted any medications: No Patient Arrived: Ambulatory Any new allergies or adverse reactions: No Arrival Time: 07:55 Had a fall or experienced change in No Accompanied By: self activities of daily living that may affect Transfer Assistance: None risk of falls: Patient Identification Verified: Yes Signs or symptoms of abuse/neglect since last visito No Secondary Verification Process Completed: Yes Hospitalized since last visit: No Patient Requires Transmission-Based Precautions: No Implantable device outside of the clinic excluding No Patient Has Alerts: No cellular tissue based products placed in the center since last visit: Pain Present Now: No Notes wound care encounter 30 day evaluation with MD before hyberbaric treatment. Electronic Signature(s) Signed: 09/13/2020 6:08:37 PM By: [name] Entered By: [name] on 09/13/2020 09:19:35 -------------------------------------------------------------------------------- Encounter Discharge Information Details Patient Name: Date of Service: Nancy [name] NA D. 09/13/2020 8:00 A M Medical Record Number: [id number] Patient Account Number: [id number] Date of Birth/Sex: Treating RN: Feb 09, 1961 (60 y.o. [name] Primary Care Atharv Barriere: [name] Other Clinician: Referring Apple Dearmas: Treating Cordney Barstow/Extender: Claris Gladden in Treatment: 4 Encounter Discharge Information Items Discharge  Condition: Stable Ambulatory Status: Ambulatory Discharge Destination: Home Transportation: Private Auto Accompanied By: self Schedule Follow-up Appointment: Yes Clinical Summary of Care: Electronic Signature(s) Signed: 09/13/2020 6:08:37 PM By: [name] Entered By: [name] on 09/13/2020 10:40:59 -------------------------------------------------------------------------------- Vitals Details Patient Name: Date of Service: Nancy [name], Nancy NA D. 09/13/2020 8:00 A M Medical Record Number: [id number] Patient Account Number: [id number] Date of Birth/Sex: Treating RN: [birth year] (60 y.o. [name], [name] Primary Care Purvi Ruehl: [name] Other Clinician: Referring Mina Babula: Treating Sora Olivo/Extender: Claris Gladden in Treatment: 4 Vital Signs Time Taken: 07:55 Temperature (F): 97.8 Height (in): 65 Pulse (bpm): 66 Weight (lbs): 175 Respiratory Rate (breaths/min): 16 Body Mass Index (BMI): 29.1 Blood Pressure (mmHg): 124/69 Reference Range: 80 - 120 mg / dl Electronic Signature(s) Signed: 09/13/2020 6:08:37 PM By: [name] Entered By: [name] on 09/13/2020 09:19:48

## 2020-09-13 NOTE — Progress Notes (Signed)
Nancy, [name] ([id number]) Visit Report for 09/13/2020 Chief Complaint Document Details Patient Name: Date of Service: Nancy [name], Nancy [name] [name] 09/13/2020 7:30 A M Medical Record Number: [id number] Patient Account Number: [id number] Date of Birth/Sex: Treating RN: 1960-07-18 (60 y.o. F) Primary Care Josey Dettmann: Nancy [name] Other Clinician: Referring Linville Decarolis: Treating Nancy [name]/Extender: Rosiland Oz in Treatment: 4 Information Obtained from: Patient Chief Complaint Rate and3/24/2022; patient is here for consideration of hyperbaric oxygen therapy osteoradionecrosis of the mandible Electronic Signature(s) Signed: 09/13/2020 8:50:16 AM By: Kalman Shan DO Entered By: Kalman Shan on 09/13/2020 08:39:28 -------------------------------------------------------------------------------- HPI Details Patient Name: Date of Service: Nancy [name] NA D. 09/13/2020 7:30 A M Medical Record Number: [id number] Patient Account Number: [id number] Date of Birth/Sex: Treating RN: 1960/12/29 (60 y.o. F) Primary Care Bellanie Matthew: Nancy [name] Other Clinician: Referring Jazzmine Kleiman: Treating Nancy [name]/Extender: Rosiland Oz in Treatment: 4 History of Present Illness HPI Description: ADMISSION 08/11/2020. 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 She was treated with chemotherapy and 30 treatments of radiation from 05/23/2017 through 06/17/2017 with 60 cGy. She also had a right hemiglossectomy and a flap closure from her fibula. Her adjunct of chemotherapy was done before her surgery. The patient did well up until late last year she tells me. She noted an area of exposed bone that gradually worsened but she really did not seek medical attention until February of this year. She also noted persistent dental infection on the right mandible. The patient underwent a biopsy that showed  reactive gingival squamous epithelium with acute inflammation and dense submucosal chronic inflammation with focal intrabone fragments and a few associated bacteria. No malignancy was identified. She currently has exposure of the dental roots and on their lying mandibular bone anteriorly on the right. 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 Her biopsy as noted was negative. She is referred here for hyperbaric oxygen. Past medical history includes bipolar disorder, cervical cancer, depression, GERD, hypertension, hypothyroidism and squamous cell carcinoma of the tongue as quoted above. She does not have any significant cardiac or pulmonary issues she is not a smoker 4/26; patient presents for her 30-day evaluation. This is her 22nd session. She states that she has not noticed improvement to the wound site. However, she has noticed that the wound is not worsening and has stalled in progression. She denies any pain to the area. She does report vision issues starting 3 days ago where she reports blurry vision at a distance. She states it has not affected her ability to do her activities of daily living. She is scheduled for an appointment on June 7th to be seen by her ENT to discuss potential mandibular surgery to the exposed bone. She overall feels well. Electronic Signature(s) Signed: 09/13/2020 8:50:16 AM By: Kalman Shan DO Entered By: Kalman Shan on 09/13/2020 08:43:55 -------------------------------------------------------------------------------- Physical Exam Details Patient Name: Date of Service: Nancy [name] NA D. 09/13/2020 7:30 A M Medical Record Number: [id number] Patient Account Number: [id number] Date of Birth/Sex: Treating RN: 1960-10-27 (60 y.o. F) Primary Care Nancy [name]: Nancy [name] Other Clinician: Referring Nancy [name]: Treating Nancy [name]/Extender: Rosiland Oz in Treatment: 4 Constitutional respirations regular, non-labored and within target  range for patient.. Eyes conjunctiva clear no eyelid edema noted. pupils equal round and reactive to light and accommodation. Respiratory normal breathing without difficulty. clear to auscultation bilaterally. Cardiovascular regular rate and rhythm with normal S1, S2. Psychiatric pleasant and cooperative. Notes Exposed  bone to the lower jaw on the right side. No signs of infection. Electronic Signature(s) Signed: 09/13/2020 8:50:16 AM By: Kalman Shan DO Entered By: Kalman Shan on 09/13/2020 08:45:11 -------------------------------------------------------------------------------- Physician Orders Details Patient Name: Date of Service: Nancy [name] NA D. 09/13/2020 7:30 A M Medical Record Number: [id number] Patient Account Number: [id number] Date of Birth/Sex: Treating RN: [birth year] (60 y.o. Nancy [name], Nancy [name] Primary Care Nancy [name]: Nancy [name] Other Clinician: Referring Nancy [name]: Treating Betzaira Mentel/Extender: Rosiland Oz in Treatment: 4 Verbal / Phone Orders: No Diagnosis Coding ICD-10 Coding Code Description M27.2 Inflammatory conditions of jaws Z51.0 Encounter for antineoplastic radiation therapy L59.8 Other specified disorders of the skin and subcutaneous tissue related to radiation Hyperbaric Oxygen Therapy Indication: - Osteoradionecrosis of right side of tongue and jaw If appropriate for treatment, begin HBOT per protocol: 2.5 ATA for 90 Minutes with 2 Five (5) Minute A Breaks ir Total Number of Treatments: - 40 One treatments per day (delivered Monday through Friday unless otherwise specified in Special Instructions below): A frin (Oxymetazoline HCL) 0.05% nasal spray - 1 spray in both nostrils daily as needed prior to HBO treatment for difficulty clearing ears Other - continue Hyberbaric treatments. Electronic Signature(s) Signed: 09/13/2020 8:50:16 AM By: Kalman Shan DO Signed: 09/13/2020 8:50:16 AM By: Kalman Shan DO Entered By: Kalman Shan on 09/13/2020 08:45:24 -------------------------------------------------------------------------------- Problem List Details Patient Name: Date of Service: Nancy [name] NA D. 09/13/2020 7:30 A M Medical Record Number: [id number] Patient Account Number: [id number] Date of Birth/Sex: Treating RN: February 01, 1961 (60 y.o. 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 09/13/2020 7:30 A M Medical Record Number: [id number] Patient Account Number: [id number] Date of Birth/Sex: Treating RN: [birth year] (60 y.o. F) Primary Care Valton Schwartz: Nancy [name] Other Clinician: Referring Abbegale Stehle: Treating Amarrah Meinhart/Extender: Rosiland Oz in Treatment: 4 Subjective Chief Complaint Information obtained from Patient Rate and3/24/2022; patient is here for consideration of hyperbaric oxygen therapy osteoradionecrosis of the mandible History of Present Illness (HPI) ADMISSION 08/11/2020. 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 She was treated with chemotherapy and 30 treatments of radiation from 05/23/2017 through 06/17/2017 with 60 cGy. She also had  a right hemiglossectomy and a flap closure from her fibula. Her adjunct of chemotherapy was done before her surgery. The patient did well up until late last year she tells me. She noted an area of exposed bone that gradually worsened but she really did not seek medical attention until February of this year. She also noted persistent dental infection on the right mandible. The patient underwent a biopsy that showed reactive gingival squamous epithelium with acute inflammation and dense submucosal chronic inflammation with focal intrabone fragments and a few associated bacteria. No malignancy was identified. She currently has exposure of the dental roots and on their lying mandibular bone anteriorly on the right. 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 Her biopsy as noted was  negative. She is referred here for hyperbaric oxygen. Past medical history includes bipolar disorder, cervical cancer, depression, GERD, hypertension, hypothyroidism and squamous cell carcinoma of the tongue as quoted above. She does not have any significant cardiac or pulmonary issues she is not a smoker 4/26; patient presents for her 30-day evaluation. This is her 22nd session. She states that she has not noticed improvement to the wound site. However, she has noticed that the wound is not worsening and has stalled in progression. She denies any pain to the area. She does report vision issues starting 3 days ago where she reports blurry vision at a distance. She states it has not affected her ability to do her activities of daily living. She is scheduled for an appointment on June 7th to be seen by her ENT to discuss potential mandibular surgery to the exposed bone. She overall feels well. Patient History Information obtained from Patient. Family History Unknown History. Social History Never smoker, Marital Status - Single, Alcohol Use - Never, Drug Use - No History, Caffeine Use - Rarely. Medical  History Cardiovascular Patient has history of Hypertension Oncologic Patient has history of Received Chemotherapy - 6 treatments in 2018, Received Radiation - 30 treatments in 2019 Psychiatric Patient has history of Confinement Anxiety Medical A Surgical History Notes nd Cardiovascular Hyperlipidemia Gastrointestinal GERD Oncologic Tongue cancer 2018 Psychiatric Bipolar Disorder, Panic Disorder, OCD Objective Constitutional respirations regular, non-labored and within target range for patient.. Vitals Time Taken: 7:55 AM, Height: 65 in, Weight: 175 lbs, BMI: 29.1, Temperature: 97.8 F, Pulse: 66 bpm, Respiratory Rate: 16 breaths/min, Blood Pressure: 124/69 mmHg. Eyes conjunctiva clear no eyelid edema noted. pupils equal round and reactive to light and accommodation. Respiratory normal breathing without difficulty. clear to auscultation bilaterally. Cardiovascular regular rate and rhythm with normal S1, S2. Psychiatric pleasant and cooperative. General Notes: Exposed bone to the lower jaw on the right side. No signs of infection. Assessment Active Problems ICD-10 Inflammatory conditions of jaws Encounter for antineoplastic radiation therapy Other specified disorders of the skin and subcutaneous tissue related to radiation Patient presents for her 30-day evaluation. She is on her 22nd session and overall doing well. She has some vision issues that is a common side effect for HBO. We discussed how this should resolve after her sessions are completed. She is scheduled to see her ENT to discuss shaving down the exposed bone. She has 18 more sessions to complete her 40. She may benefit from HBO post surgery. Plan Hyperbaric Oxygen Therapy: Indication: - Osteoradionecrosis of right side of tongue and jaw If appropriate for treatment, begin HBOT per protocol: 2.5 ATA for 90 Minutes with 2 Five (5) Minute Air Breaks T Number of Treatments: - 40 otal One treatments per day  (delivered Monday through Friday unless otherwise specified in Special Instructions below): Afrin (Oxymetazoline HCL) 0.05% nasal spray - 1 spray in both nostrils daily as needed prior to HBO treatment for difficulty clearing ears Other - continue Hyberbaric treatments. 1. Continue HBO 2. Follow-up in June with ENT Electronic Signature(s) Signed: 09/13/2020 8:50:16 AM By: Geralyn Corwin DO Entered By: Geralyn Corwin on 09/13/2020 08:47:01 -------------------------------------------------------------------------------- HxROS Details Patient Name: Date of Service: Mervin Hack NA D. 09/13/2020 7:30 A M Medical Record Number: [id number] Patient Account Number: [id number] Date of Birth/Sex: Treating RN: 1960-07-26 (60 y.o. F) Primary Care Tawsha Terrero: Alain Honey Other Clinician: Referring Taniah Reinecke: Treating Sharnika Binney/Extender: Damita Dunnings in Treatment: 4 Information Obtained From Patient Cardiovascular Medical History: Positive for: Hypertension Past Medical History Notes: Hyperlipidemia  Gastrointestinal Medical History: Past Medical History Notes: GERD Oncologic Medical History: Positive for: Received Chemotherapy - 6 treatments in 2018; Received Radiation - 30 treatments in 2019 Past Medical History Notes: Tongue cancer 2018 Psychiatric Medical History: Positive for: Confinement Anxiety Past Medical History Notes: Bipolar Disorder, Panic Disorder, OCD Immunizations Pneumococcal Vaccine: Received Pneumococcal Vaccination: Yes Implantable Devices None Family and Social History Unknown History: Yes; Never smoker; Marital Status - Single; Alcohol Use: Never; Drug Use: No History; Caffeine Use: Rarely; Financial Concerns: No; Food, Clothing or Shelter Needs: No; Support System Lacking: No; Transportation Concerns: No Electronic Signature(s) Signed: 09/13/2020 8:50:16 AM By: Kalman Shan DO Entered By: Kalman Shan on 09/13/2020  08:44:02 -------------------------------------------------------------------------------- SuperBill Details Patient Name: Date of Service: Nancy [name] NA D. 09/13/2020 Medical Record Number: [id number] Patient Account Number: [id number] Date of Birth/Sex: Treating RN: 1960/09/30 (61 y.o. Nancy [name] Primary Care Zabria Liss: Nancy [name] Other Clinician: Referring Tiffany Calmes: Treating Angela Platner/Extender: Rosiland Oz in Treatment: 4 Diagnosis Coding ICD-10 Codes Code Description M27.2 Inflammatory conditions of jaws Z51.0 Encounter for antineoplastic radiation therapy L59.8 Other specified disorders of the skin and subcutaneous tissue related to radiation Facility Procedures CPT4 Code: [id number] Description: 54656 - WOUND CARE VISIT-LEV 2 EST PT Modifier: Quantity: 1 Electronic Signature(s) Signed: 09/13/2020 8:50:16 AM By: Kalman Shan DO Entered By: Kalman Shan on 09/13/2020 08:49:48

## 2020-09-13 NOTE — Progress Notes (Signed)
SCHUYLER, BEHAN ([id number]) Visit Report for 09/13/2020 Arrival Information Details Patient Name: Date of Service: [place], [name] 09/13/2020 7:30 A M Medical Record Number: [id number] Patient Account Number: [id number] Date of Birth/Sex: Treating RN: 07-25-1960 (60 y.o. [name], [name] Primary Care Laronn Devonshire: [name] Other Clinician: Referring Tymier Lindholm: Treating Issaih Kaus/Extender: Rosiland Oz in Treatment: 4 Visit Information History Since Last Visit Added or deleted any medications: No Patient Arrived: Ambulatory Any new allergies or adverse reactions: No Arrival Time: 07:55 Had a fall or experienced change in No Accompanied By: self activities of daily living that may affect Transfer Assistance: None risk of falls: Patient Identification Verified: Yes Signs or symptoms of abuse/neglect since last visito No Secondary Verification Process Completed: Yes Hospitalized since last visit: No Patient Requires Transmission-Based Precautions: No Implantable device outside of the clinic excluding No Patient Has Alerts: No cellular tissue based products placed in the center since last visit: Pain Present Now: No Electronic Signature(s) Signed: 09/13/2020 6:08:37 PM By: [name] Entered By: [name] on 09/13/2020 08:12:25 -------------------------------------------------------------------------------- Clinic Level of Care Assessment Details Patient Name: Date of Service: MARIUM, RAGAN [name] 09/13/2020 7:30 A M Medical Record Number: [id number] Patient Account Number: [id number] Date of Birth/Sex: Treating RN: March 05, 1961 (60 y.o. [name], [name] Primary Care Drisana Schweickert: [name] Other Clinician: Referring Durward Matranga: Treating Jawann Urbani/Extender: Rosiland Oz in Treatment: 4 Clinic Level of Care Assessment Items TOOL 4 Quantity Score X- 1 0 Use when only an EandM is performed on FOLLOW-UP visit ASSESSMENTS - Nursing Assessment /  Reassessment X- 1 10 Reassessment of Co-morbidities (includes updates in patient status) X- 1 5 Reassessment of Adherence to Treatment Plan ASSESSMENTS - Wound and Skin A ssessment / Reassessment []  - 0 Simple Wound Assessment / Reassessment - one wound []  - 0 Complex Wound Assessment / Reassessment - multiple wounds X- 1 10 Dermatologic / Skin Assessment (not related to wound area) ASSESSMENTS - Focused Assessment []  - 0 Circumferential Edema Measurements - multi extremities []  - 0 Nutritional Assessment / Counseling / Intervention []  - 0 Lower Extremity Assessment (monofilament, tuning fork, pulses) []  - 0 Peripheral Arterial Disease Assessment (using hand held doppler) ASSESSMENTS - Ostomy and/or Continence Assessment and Care []  - 0 Incontinence Assessment and Management []  - 0 Ostomy Care Assessment and Management (repouching, etc.) PROCESS - Coordination of Care X - Simple Patient / Family Education for ongoing care 1 15 []  - 0 Complex (extensive) Patient / Family Education for ongoing care X- 1 10 Staff obtains Programmer, systems, Records, T Results / Process Orders est []  - 0 Staff telephones HHA, Nursing Homes / Clarify orders / etc []  - 0 Routine Transfer to another Facility (non-emergent condition) []  - 0 Routine Hospital Admission (non-emergent condition) []  - 0 New Admissions / Biomedical engineer / Ordering NPWT Apligraf, etc. , []  - 0 Emergency Hospital Admission (emergent condition) X- 1 10 Simple Discharge Coordination []  - 0 Complex (extensive) Discharge Coordination PROCESS - Special Needs []  - 0 Pediatric / Minor Patient Management []  - 0 Isolation Patient Management []  - 0 Hearing / Language / Visual special needs []  - 0 Assessment of Community assistance (transportation, D/C planning, etc.) []  - 0 Additional assistance / Altered mentation []  - 0 Support Surface(s) Assessment (bed, cushion, seat, etc.) INTERVENTIONS - Wound Cleansing /  Measurement []  - 0 Simple Wound Cleansing - one wound []  - 0 Complex Wound Cleansing - multiple wounds []  - 0 Wound Imaging (photographs - any number of wounds) []  -  0 Wound Tracing (instead of photographs) $RemoveBeforeD'[]'xaJsciXaCoByuZ$  - 0 Simple Wound Measurement - one wound $RemoveB'[]'ovbFcIfv$  - 0 Complex Wound Measurement - multiple wounds INTERVENTIONS - Wound Dressings $RemoveBeforeD'[]'iqONbQTkcWgMTt$  - 0 Small Wound Dressing one or multiple wounds $RemoveBeforeD'[]'pFhGeVSvTLtbqG$  - 0 Medium Wound Dressing one or multiple wounds $RemoveBeforeD'[]'ugsZLnwEwrHKnX$  - 0 Large Wound Dressing one or multiple wounds $RemoveBeforeD'[]'XHhlhtJCzFDUwe$  - 0 Application of Medications - topical $RemoveB'[]'fWIxCNZS$  - 0 Application of Medications - injection INTERVENTIONS - Miscellaneous $RemoveBeforeD'[]'hzEilyoRHATywL$  - 0 External ear exam $Remove'[]'UisRkbG$  - 0 Specimen Collection (cultures, biopsies, blood, body fluids, etc.) $RemoveBefor'[]'wARAIgVghJry$  - 0 Specimen(s) / Culture(s) sent or taken to Lab for analysis $RemoveBefo'[]'DjVjxbsVdXQ$  - 0 Patient Transfer (multiple staff / Civil Service fast streamer / Similar devices) $RemoveBeforeDE'[]'LzRLPJtLwGsbiyF$  - 0 Simple Staple / Suture removal (25 or less) $Remove'[]'RNxxDas$  - 0 Complex Staple / Suture removal (26 or more) $Remove'[]'wDqXNNc$  - 0 Hypo / Hyperglycemic Management (close monitor of Blood Glucose) $RemoveBefore'[]'lDIGAXgQjiLVf$  - 0 Ankle / Brachial Index (ABI) - do not check if billed separately X- 1 5 Vital Signs Has the patient been seen at the hospital within the last three years: Yes Total Score: 65 Level Of Care: New/Established - Level 2 Electronic Signature(s) Signed: 09/13/2020 6:08:37 PM By: [name] Entered By: [name] on 09/13/2020 08:44:53 -------------------------------------------------------------------------------- Encounter Discharge Information Details Patient Name: Date of Service: [name] 09/13/2020 7:30 A M Medical Record Number: [id number] Patient Account Number: [id number] Date of Birth/Sex: Treating RN: [birth year] (60 y.o. [name] Primary Care Kathrynn Backstrom: [name] Other Clinician: Referring Kenadie Royce: Treating Alexias Margerum/Extender: Rosiland Oz in Treatment: 4 Encounter Discharge Information Items Discharge  Condition: Stable Ambulatory Status: Ambulatory Discharge Destination: Other (Note Required) Telephoned: No Orders Sent: No Transportation: Private Auto Accompanied By: self Schedule Follow-up Appointment: Yes Clinical Summary of Care: Notes Patient now for hyberbaric treatment. Electronic Signature(s) Signed: 09/13/2020 6:08:37 PM By: [name] Entered By: [name] on 09/13/2020 08:45:37 -------------------------------------------------------------------------------- Lower Extremity Assessment Details Patient Name: Date of Service: HAELEIGH, STREIFF [name] 09/13/2020 7:30 A M Medical Record Number: [id number] Patient Account Number: [id number] Date of Birth/Sex: Treating RN: [birth year] (60 y.o. [name] Primary Care Demara Lover: [name] Other Clinician: Referring Dailah Opperman: Treating Kenni Newton/Extender: Rosiland Oz in Treatment: 4 Electronic Signature(s) Signed: 09/13/2020 6:08:37 PM By: [name] Entered By: [name] on 09/13/2020 08:12:53 -------------------------------------------------------------------------------- Multi Wound Chart Details Patient Name: Date of Service: [name] 09/13/2020 7:30 A M Medical Record Number: [id number] Patient Account Number: [id number] Date of Birth/Sex: Treating RN: 01-17-1961 (60 y.o. F) Primary Care Anelis Hrivnak: [name] Other Clinician: Referring Emanuel Dowson: Treating Keileigh Vahey/Extender: Rosiland Oz in Treatment: 4 Vital Signs Height(in): 65 Pulse(bpm): 66 Weight(lbs): 175 Blood Pressure(mmHg): 124/69 Body Mass Index(BMI): 29 Temperature(F): 97.8 Respiratory Rate(breaths/min): 16 Wound Assessments Treatment Notes Electronic Signature(s) Signed: 09/13/2020 8:50:16 AM By: Kalman Shan DO Entered By: Kalman Shan on 09/13/2020 08:39:19 -------------------------------------------------------------------------------- Multi-Disciplinary Care Plan Details Patient  Name: Date of Service: [name] 09/13/2020 7:30 A M Medical Record Number: [id number] Patient Account Number: [id number] Date of Birth/Sex: Treating RN: 07-Oct-1960 (60 y.o. 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 associated with the hyperbaric oxygen chamber Potential for barotraumas to ears, sinuses, teeth, and lungs or cerebral gas embolism related to changes in atmospheric pressure inside hyperbaric oxygen chamber Goals: Barotrauma will be prevented during HBO2 Date Initiated: 08/11/2020 Target Resolution Date: 09/22/2020 Goal Status: Active  Patient and/or family will be able to state/discuss factors appropriate to the management of their disease process during treatment Date Initiated: 08/11/2020 Date Inactivated: 09/13/2020 Target Resolution Date: 09/16/2020 Goal Status: Met Patient/caregiver will verbalize understanding of HBO goals, rationale, procedures and potential hazards Date Initiated: 08/11/2020 Date Inactivated: 09/13/2020 Target Resolution Date: 09/13/2020 Goal Status: Met Interventions: Assess and provide for patients comfort related to the hyperbaric environment and equalization of middle ear Assess for signs and symptoms related to adverse events, including but not limited to confinement anxiety, pneumothorax, oxygen toxicity and baurotrauma Assess patient for any history of confinement anxiety Notes: Electronic Signature(s) Signed: 09/13/2020 6:08:37 PM By: [name] Entered By: [name] on 09/13/2020 08:13:22 -------------------------------------------------------------------------------- Pain Assessment Details Patient Name: Date of Service: [name] 09/13/2020 7:30 A M Medical Record Number: [id number] Patient Account Number:  [id number] Date of Birth/Sex: Treating RN: 04-27-1961 (60 y.o. [name] Primary Care Caliope Ruppert: [name] Other Clinician: Referring Makya Phillis: Treating Chelsye Suhre/Extender: Rosiland Oz in Treatment: 4 Active Problems Location of Pain Severity and Description of Pain Patient Has Paino No Site Locations Rate the pain. Current Pain Level: 0 Pain Management and Medication Current Pain Management: Medication: No Cold Application: No Rest: No Massage: No Activity: No T.E.N.S.: No Heat Application: No Leg drop or elevation: No Is the Current Pain Management Adequate: Adequate How does your wound impact your activities of daily livingo Sleep: No Bathing: No Appetite: No Relationship With Others: No Bladder Continence: No Emotions: No Bowel Continence: No Work: No Toileting: No Drive: No Dressing: No Hobbies: No Electronic Signature(s) Signed: 09/13/2020 6:08:37 PM By: [name] Entered By: [name] on 09/13/2020 08:12:48 -------------------------------------------------------------------------------- Patient/Caregiver Education Details Patient Name: Date of Service: Eugenia Mcalpine D. 4/26/2022andnbsp7:30 A M Medical Record Number: [id number] Patient Account Number: [id number] Date of Birth/Gender: Treating RN: 12-09-1960 (60 y.o. [name] Primary Care Physician: [name] Other Clinician: Referring Physician: Treating Physician/Extender: Rosiland Oz in Treatment: 4 Education Assessment Education Provided To: Patient Education Topics Provided Wound/Skin Impairment: Handouts: Skin Care Do's and Dont's Methods: Explain/Verbal Responses: Reinforcements needed Electronic Signature(s) Signed: 09/13/2020 6:08:37 PM By: [name] Entered By: [name] on 09/13/2020 08:13:37 -------------------------------------------------------------------------------- Vitals Details Patient Name: Date of  Service: [name] 09/13/2020 7:30 A M Medical Record Number: [id number] Patient Account Number: [id number] Date of Birth/Sex: Treating RN: 04-13-1961 (60 y.o. [name], [name] Primary Care Suhaib Guzzo: [name] Other Clinician: Referring Nieves Barberi: Treating Kail Fraley/Extender: Rosiland Oz in Treatment: 4 Vital Signs Time Taken: 07:55 Temperature (F): 97.8 Height (in): 65 Pulse (bpm): 66 Weight (lbs): 175 Respiratory Rate (breaths/min): 16 Body Mass Index (BMI): 29.1 Blood Pressure (mmHg): 124/69 Reference Range: 80 - 120 mg / dl Electronic Signature(s) Signed: 09/13/2020 6:08:37 PM By: [name] Entered By: [name] on 09/13/2020 08:12:38

## 2020-09-14 ENCOUNTER — Other Ambulatory Visit: Payer: Self-pay

## 2020-09-14 ENCOUNTER — Encounter (HOSPITAL_BASED_OUTPATIENT_CLINIC_OR_DEPARTMENT_OTHER): Payer: Medicare Other | Admitting: Internal Medicine

## 2020-09-14 DIAGNOSIS — M272 Inflammatory conditions of jaws: Secondary | ICD-10-CM | POA: Diagnosis not present

## 2020-09-14 NOTE — Progress Notes (Signed)
MINYON, BILLITER ([id number]) Visit Report for 09/14/2020 Arrival Information Details Patient Name: Date of Service: [place], [name] 09/14/2020 8:00 A M Medical Record Number: [id number] Patient Account Number: [id number] Date of Birth/Sex: Treating RN: 1960-11-20 (60 y.o. [name], [name] Primary Care Waverly Chavarria: [name] Other Clinician: Referring Rikki Smestad: Treating Faline Langer/Extender: Claris Gladden in Treatment: 4 Visit Information History Since Last Visit Added or deleted any medications: No Patient Arrived: Ambulatory Any new allergies or adverse reactions: No Arrival Time: 07:50 Had a fall or experienced change in No Accompanied By: self activities of daily living that may affect Transfer Assistance: None risk of falls: Patient Identification Verified: Yes Signs or symptoms of abuse/neglect since last visito No Secondary Verification Process Completed: Yes Hospitalized since last visit: No Patient Requires Transmission-Based Precautions: No Implantable device outside of the clinic excluding No Patient Has Alerts: No cellular tissue based products placed in the center since last visit: Pain Present Now: No Electronic Signature(s) Signed: 09/14/2020 5:19:23 PM By: [name] Entered By: [name] on 09/14/2020 08:51:22 -------------------------------------------------------------------------------- Encounter Discharge Information Details Patient Name: Date of Service: [name] 09/14/2020 8:00 A M Medical Record Number: [id number] Patient Account Number: [id number] Date of Birth/Sex: Treating RN: 10-24-1960 (60 y.o. [name] Primary Care Zackeriah Kissler: [name] Other Clinician: Referring Rozetta Stumpp: Treating Aneri Slagel/Extender: Claris Gladden in Treatment: 4 Encounter Discharge Information Items Discharge Condition: Stable Ambulatory Status: Ambulatory Discharge Destination:  Home Transportation: Private Auto Accompanied By: self Schedule Follow-up Appointment: Yes Clinical Summary of Care: Electronic Signature(s) Signed: 09/14/2020 5:19:23 PM By: [name] Entered By: [name] on 09/14/2020 10:29:09 -------------------------------------------------------------------------------- Vitals Details Patient Name: Date of Service: [name], [name] 09/14/2020 8:00 A M Medical Record Number: [id number] Patient Account Number: [id number] Date of Birth/Sex: Treating RN: 15-Oct-1960 (60 y.o. [name], [name] Primary Care Iyesha Such: [name] Other Clinician: Referring Jarnell Cordaro: Treating Nao Linz/Extender: Claris Gladden in Treatment: 4 Vital Signs Time Taken: 07:50 Temperature (F): 98.3 Height (in): 65 Pulse (bpm): 74 Weight (lbs): 175 Respiratory Rate (breaths/min): 18 Body Mass Index (BMI): 29.1 Blood Pressure (mmHg): 138/78 Reference Range: 80 - 120 mg / dl Electronic Signature(s) Signed: 09/14/2020 5:19:23 PM By: [name] Entered By: [name] on 09/14/2020 08:51:35

## 2020-09-15 ENCOUNTER — Other Ambulatory Visit: Payer: Self-pay

## 2020-09-15 ENCOUNTER — Encounter (HOSPITAL_BASED_OUTPATIENT_CLINIC_OR_DEPARTMENT_OTHER): Payer: Medicare Other | Admitting: Internal Medicine

## 2020-09-15 DIAGNOSIS — M272 Inflammatory conditions of jaws: Secondary | ICD-10-CM | POA: Diagnosis not present

## 2020-09-16 ENCOUNTER — Encounter (HOSPITAL_BASED_OUTPATIENT_CLINIC_OR_DEPARTMENT_OTHER): Payer: Medicare Other | Admitting: Internal Medicine

## 2020-09-16 DIAGNOSIS — L598 Other specified disorders of the skin and subcutaneous tissue related to radiation: Secondary | ICD-10-CM | POA: Diagnosis not present

## 2020-09-16 DIAGNOSIS — M272 Inflammatory conditions of jaws: Secondary | ICD-10-CM | POA: Diagnosis not present

## 2020-09-19 ENCOUNTER — Other Ambulatory Visit: Payer: Self-pay

## 2020-09-19 ENCOUNTER — Encounter (HOSPITAL_BASED_OUTPATIENT_CLINIC_OR_DEPARTMENT_OTHER): Payer: Medicare Other | Attending: Internal Medicine | Admitting: Internal Medicine

## 2020-09-19 DIAGNOSIS — Z510 Encounter for antineoplastic radiation therapy: Secondary | ICD-10-CM | POA: Diagnosis not present

## 2020-09-19 DIAGNOSIS — M272 Inflammatory conditions of jaws: Secondary | ICD-10-CM | POA: Diagnosis not present

## 2020-09-19 NOTE — Progress Notes (Signed)
Nancy [name] ([id number]) Visit Report for 09/19/2020 HBO Details Patient Name: Date of Service: [name], [name] 09/19/2020 8:00 A M Medical Record Number: [id number] Patient Account Number: [id number] Date of Birth/Sex: Treating RN: [birth year] (60 y.o. 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 HBO Treatment Details Treatment Number: 26 Patient Type: Outpatient Chamber Type: Monoplace Chamber Serial #: [id number] Treatment Protocol: 2.5 ATA with 90 minutes oxygen, with two 5 minute air breaks Treatment Details Compression Rate Down: 2.0 psi / minute De-Compression Rate Up: 2.0 psi / minute A breaks and breathing ir Compress Tx Pressure periods Decompress Decompress Begins Reached (leave unused spaces Begins Ends blank) Chamber Pressure (ATA 1 2.5 2.5 2.5 2.5 2.5 - - 2.5 1 ) Clock Time (24 hr) 08:02 08:14 08:44 08:49 09:19 09:24 - - 09:54 10:04 Treatment Length: 122 (minutes) Treatment Segments: 4 Vital Signs Capillary Blood Glucose Reference Range: 80 - 120 mg / dl HBO Diabetic Blood Glucose Intervention Range: <131 mg/dl or >249 mg/dl Time Vitals Blood Respiratory Capillary Blood Glucose Pulse Action Type: Pulse: Temperature: Taken: Pressure: Rate: Glucose (mg/dl): Meter #: Oximetry (%) Taken: Pre 07:55 133/75 70 18 98.1 Post 10:06 148/76 58 18 97.8 Treatment Response Treatment Toleration: Well Treatment Completion Status: Treatment Completed without Adverse Event Nancy [name] Notes No concerns with treatment given Physician HBO Attestation: I certify that I supervised this HBO treatment in accordance with  Medicare guidelines. A trained emergency response team is readily available per Yes hospital policies and procedures. Continue HBOT as ordered. Yes Electronic Signature(s) Signed: 09/19/2020 5:25:01 PM By: Nancy Ham MD Entered By: Nancy [name] on 09/19/2020 17:04:24 -------------------------------------------------------------------------------- HBO Safety Checklist Details Patient Name: Date of Service: Nancy [name] NA D. 09/19/2020 8:00 A M Medical Record Number: [id number] Patient Account Number: [id number] Date of Birth/Sex: Treating RN: December 13, 1960 (60 y.o. Nancy [name], Nancy [name] Primary Care Nancy [name]: Nancy [name] Other Clinician: Referring Ihsan Nomura: Treating Nancy [name]/Extender: Nancy [name] in Treatment: 5 HBO Safety Checklist Items Safety Checklist Consent Form Signed Patient voided / foley secured and emptied When did you last eato last night Last dose of injectable or oral agent n/a Ostomy pouch emptied and vented if applicable NA All implantable devices assessed, documented and approved NA Intravenous access site secured and place NA Valuables secured Linens and cotton and cotton/polyester blend (less than 51% polyester) Personal oil-based products / skin lotions / body lotions removed NA Wigs or hairpieces removed Smoking or tobacco materials removed NA Books / newspapers / magazines / loose paper removed NA Cologne, aftershave, perfume and deodorant removed NA Jewelry removed (may wrap wedding band) Make-up removed NA Hair care products removed NA Battery operated devices (external) removed NA Heating patches and chemical warmers removed NA Titanium eyewear removed Nail polish cured greater than 10 hours NA Casting material cured greater than 10 hours NA Hearing aids removed NA Loose dentures or partials removed NA Prosthetics have been removed NA Patient demonstrates correct use of air break device (if  applicable) Patient concerns have been addressed Patient grounding bracelet on and cord attached to chamber Specifics for Inpatients (complete in addition to above) Medication sheet sent with patient Intravenous medications needed or due during therapy sent with patient Drainage tubes (e.g. nasogastric tube or chest tube secured and  vented) Endotracheal or Tracheotomy tube secured Cuff deflated of air and inflated with saline Airway suctioned Electronic Signature(s) Signed: 09/19/2020 5:27:54 PM By: Nancy [name] Entered By: Nancy [name] on 09/19/2020 09:54:09

## 2020-09-19 NOTE — Progress Notes (Signed)
IVOREE, FELMLEE ([id number]) Visit Report for 09/15/2020 Arrival Information Details Patient Name: Date of Service: [place], [name] 09/15/2020 8:15 A M Medical Record Number: [id number] Patient Account Number: [id number] Date of Birth/Sex: Treating RN: 1960/08/02 (60 y.o. Nancy Fetter Primary Care Shawnn Bouillon: [name] Other Clinician: Referring Florence Yeung: Treating Laelah Siravo/Extender: Claris Gladden in Treatment: 5 Visit Information History Since Last Visit Added or deleted any medications: No Patient Arrived: Ambulatory Any new allergies or adverse reactions: No Arrival Time: 08:10 Had a fall or experienced change in No Accompanied By: alone activities of daily living that may affect Transfer Assistance: None risk of falls: Patient Identification Verified: Yes Signs or symptoms of abuse/neglect since last visito No Secondary Verification Process Completed: Yes Hospitalized since last visit: No Patient Requires Transmission-Based Precautions: No Implantable device outside of the clinic excluding No Patient Has Alerts: No cellular tissue based products placed in the center since last visit: Pain Present Now: No Electronic Signature(s) Signed: 09/19/2020 5:39:21 PM By: Levan Hurst RN, BSN Entered By: Levan Hurst on 09/15/2020 10:51:34 -------------------------------------------------------------------------------- Encounter Discharge Information Details Patient Name: Date of Service: [name] 09/15/2020 8:15 A M Medical Record Number: [id number] Patient Account Number: [id number] Date of Birth/Sex: Treating RN: [birth year] (60 y.o. Nancy Fetter Primary Care Damoni Causby: [name] Other Clinician: Referring Lavelle Akel: Treating Neila Teem/Extender: Claris Gladden in Treatment: 5 Encounter Discharge Information Items Discharge Condition: Stable Ambulatory Status: Ambulatory Discharge Destination:  Home Transportation: Private Auto Accompanied By: alone Schedule Follow-up Appointment: Yes Clinical Summary of Care: Patient Declined Electronic Signature(s) Signed: 09/19/2020 5:39:21 PM By: Levan Hurst RN, BSN Entered By: Levan Hurst on 09/15/2020 10:54:20 -------------------------------------------------------------------------------- Vitals Details Patient Name: Date of Service: [name] 09/15/2020 8:15 A M Medical Record Number: [id number] Patient Account Number: [id number] Date of Birth/Sex: Treating RN: Aug 19, 1960 (60 y.o. Nancy Fetter Primary Care Slevin Gunby: [name] Other Clinician: Referring Jorma Tassinari: Treating Takiya Belmares/Extender: Claris Gladden in Treatment: 5 Vital Signs Time Taken: 08:10 Temperature (F): 98.2 Height (in): 65 Pulse (bpm): 59 Weight (lbs): 175 Respiratory Rate (breaths/min): 16 Body Mass Index (BMI): 29.1 Blood Pressure (mmHg): 132/62 Reference Range: 80 - 120 mg / dl Electronic Signature(s) Signed: 09/19/2020 5:39:21 PM By: Levan Hurst RN, BSN Entered By: Levan Hurst on 09/15/2020 10:51:56

## 2020-09-19 NOTE — Progress Notes (Signed)
VANECIA, LIMPERT ([id number]) Visit Report for 09/19/2020 Arrival Information Details Patient Name: Date of Service: [place], [name] 09/19/2020 8:00 A M Medical Record Number: [id number] Patient Account Number: [id number] Date of Birth/Sex: Treating RN: 08-06-1960 (60 y.o. [name], [name] Primary Care Chancy Smigiel: [name] Other Clinician: Referring Aimee Heldman: Treating Jabori Henegar/Extender: Rosana Hoes, [name] in Treatment: 5 Visit Information History Since Last Visit Added or deleted any medications: No Patient Arrived: Ambulatory Any new allergies or adverse reactions: No Arrival Time: 07:55 Had a fall or experienced change in No Accompanied By: self activities of daily living that may affect Transfer Assistance: None risk of falls: Patient Identification Verified: Yes Signs or symptoms of abuse/neglect since last visito No Secondary Verification Process Completed: Yes Hospitalized since last visit: No Patient Requires Transmission-Based Precautions: No Implantable device outside of the clinic excluding No Patient Has Alerts: No cellular tissue based products placed in the center since last visit: Pain Present Now: No Electronic Signature(s) Signed: 09/19/2020 5:27:54 PM By: [name] Entered By: [name] on 09/19/2020 09:53:11 -------------------------------------------------------------------------------- Encounter Discharge Information Details Patient Name: Date of Service: [name], [name] 09/19/2020 8:00 A M Medical Record Number: [id number] Patient Account Number: [id number] Date of Birth/Sex: Treating RN: 06-28-1960 (60 y.o. [name] Primary Care Milagros Middendorf: [name] Other Clinician: Referring Elika Godar: Treating Tyeisha Dinan/Extender: Cleophas Dunker in Treatment: 5 Encounter Discharge Information Items Discharge Condition: Stable Ambulatory Status: Ambulatory Discharge Destination:  Home Transportation: Private Auto Accompanied By: self Schedule Follow-up Appointment: Yes Clinical Summary of Care: Electronic Signature(s) Signed: 09/19/2020 5:27:54 PM By: [name] Entered By: [name] on 09/19/2020 10:19:24 -------------------------------------------------------------------------------- Patient/Caregiver Education Details Patient Name: Date of Service: Eugenia Mcalpine D. 5/2/2022andnbsp8:00 A M Medical Record Number: [id number] Patient Account Number: [id number] Date of Birth/Gender: Treating RN: August 01, 1960 (60 y.o. [name] Primary Care Physician: [name] Other Clinician: Referring Physician: Treating Physician/Extender: Cleophas Dunker in Treatment: 5 Education Assessment Education Provided To: Patient Education Topics Provided Wound/Skin Impairment: Handouts: Skin Care Do's and Dont's Methods: Explain/Verbal Responses: Reinforcements needed Electronic Signature(s) Signed: 09/19/2020 5:27:54 PM By: [name] Entered By: [name] on 09/19/2020 10:19:15 -------------------------------------------------------------------------------- Vitals Details Patient Name: Date of Service: [name], [name] 09/19/2020 8:00 A M Medical Record Number: [id number] Patient Account Number: [id number] Date of Birth/Sex: Treating RN: [birth year] (60 y.o. [name], [name] Primary Care Ronetta Molla: [name] Other Clinician: Referring Annalyn Blecher: Treating Antwaun Buth/Extender: Rosana Hoes, [name] in Treatment: 5 Vital Signs Time Taken: 07:55 Temperature (F): 98.1 Height (in): 65 Pulse (bpm): 70 Weight (lbs): 175 Respiratory Rate (breaths/min): 18 Body Mass Index (BMI): 29.1 Blood Pressure (mmHg): 133/75 Reference Range: 80 - 120 mg / dl Electronic Signature(s) Signed: 09/19/2020 5:27:54 PM By: [name] Entered By: [name] on 09/19/2020 09:53:24

## 2020-09-19 NOTE — Progress Notes (Signed)
LEXEE, BRASHEARS ([id number]) Visit Report for 09/19/2020 SuperBill Details Patient Name: Date of Service: Nancy [name], Nancy [name] [name] 09/19/2020 Medical Record Number: [id number] Patient Account Number: [id number] Date of Birth/Sex: Treating RN: 05/09/1961 (60 y.o. 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 Facility Procedures CPT4 Code Description Modifier Quantity 84132440 G0277-(Facility Use Only) HBOT full body chamber, 44min , 4 Physician Procedures Quantity CPT4 Code Description Modifier 1027253 66440 - WC PHYS HYPERBARIC OXYGEN THERAPY 1 ICD-10 Diagnosis Description 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

## 2020-09-19 NOTE — Progress Notes (Signed)
BAILEY, FAIELLA ([id number]) Visit Report for 09/16/2020 Arrival Information Details Patient Name: Date of Service: [place], [name] 09/16/2020 8:00 A M Medical Record Number: [id number] Patient Account Number: [id number] Date of Birth/Sex: Treating RN: Sep 10, 1960 (60 y.o. Nancy Fetter Primary Care Langdon Crosson: [name] Other Clinician: Referring Leshonda Galambos: Treating Magnum Lunde/Extender: Claris Gladden in Treatment: 5 Visit Information History Since Last Visit Added or deleted any medications: No Patient Arrived: Ambulatory Any new allergies or adverse reactions: No Arrival Time: 07:56 Had a fall or experienced change in No Accompanied By: alone activities of daily living that may affect Transfer Assistance: None risk of falls: Patient Identification Verified: Yes Signs or symptoms of abuse/neglect since last visito No Secondary Verification Process Completed: Yes Hospitalized since last visit: No Patient Requires Transmission-Based Precautions: No Implantable device outside of the clinic excluding No Patient Has Alerts: No cellular tissue based products placed in the center since last visit: Pain Present Now: No Electronic Signature(s) Signed: 09/19/2020 5:39:21 PM By: Levan Hurst RN, BSN Entered By: Levan Hurst on 09/16/2020 11:44:16 -------------------------------------------------------------------------------- Encounter Discharge Information Details Patient Name: Date of Service: [name] 09/16/2020 8:00 A M Medical Record Number: [id number] Patient Account Number: [id number] Date of Birth/Sex: Treating RN: 08/30/1960 (60 y.o. Nancy Fetter Primary Care Jaja Switalski: [name] Other Clinician: Referring Ozie Lupe: Treating Quinne Pires/Extender: Claris Gladden in Treatment: 5 Encounter Discharge Information Items Discharge Condition: Stable Ambulatory Status: Ambulatory Discharge Destination:  Home Transportation: Private Auto Accompanied By: alone Schedule Follow-up Appointment: Yes Clinical Summary of Care: Patient Declined Electronic Signature(s) Signed: 09/19/2020 5:39:21 PM By: Levan Hurst RN, BSN Entered By: Levan Hurst on 09/16/2020 11:47:56 -------------------------------------------------------------------------------- Vitals Details Patient Name: Date of Service: [name], [name] 09/16/2020 8:00 A M Medical Record Number: [id number] Patient Account Number: [id number] Date of Birth/Sex: Treating RN: May 09, 1961 (60 y.o. Nancy Fetter Primary Care Shalie Schremp: [name] Other Clinician: Referring Wrigley Plasencia: Treating Cambria Osten/Extender: Claris Gladden in Treatment: 5 Vital Signs Time Taken: 07:56 Temperature (F): 98.0 Height (in): 65 Pulse (bpm): 79 Weight (lbs): 175 Respiratory Rate (breaths/min): 16 Body Mass Index (BMI): 29.1 Blood Pressure (mmHg): 129/72 Reference Range: 80 - 120 mg / dl Electronic Signature(s) Signed: 09/19/2020 5:39:21 PM By: Levan Hurst RN, BSN Entered By: Levan Hurst on 09/16/2020 11:44:38

## 2020-09-20 ENCOUNTER — Encounter (HOSPITAL_BASED_OUTPATIENT_CLINIC_OR_DEPARTMENT_OTHER): Payer: Medicare Other | Admitting: Internal Medicine

## 2020-09-21 ENCOUNTER — Encounter (HOSPITAL_BASED_OUTPATIENT_CLINIC_OR_DEPARTMENT_OTHER): Payer: Medicare Other | Admitting: Physician Assistant

## 2020-09-21 ENCOUNTER — Other Ambulatory Visit: Payer: Self-pay

## 2020-09-21 DIAGNOSIS — M272 Inflammatory conditions of jaws: Secondary | ICD-10-CM | POA: Diagnosis not present

## 2020-09-21 NOTE — Progress Notes (Signed)
RUDI, KNIPPENBERG ([id number]) Visit Report for 09/21/2020 SuperBill Details Patient Name: Date of Service: SEBR[place], [name] 09/21/2020 Medical Record Number: [id number] Patient Account Number: [id number] Date of Birth/Sex: Treating RN: 26-Mar-1961 (60 y.o. 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 Facility Procedures CPT4 Code Description Modifier Quantity 00867619 G0277-(Facility Use Only) HBOT full body chamber, 33min , 4 Physician Procedures Quantity CPT4 Code Description Modifier [id number] 12458 - WC PHYS HYPERBARIC OXYGEN THERAPY 1 ICD-10 Diagnosis Description 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

## 2020-09-21 NOTE — Progress Notes (Signed)
KASSIAH, MCCRORY ([id number]) Visit Report for 09/21/2020 Arrival Information Details Patient Name: Date of Service: [place], [name] 09/21/2020 8:00 A M Medical Record Number: [id number] Patient Account Number: [id number] Date of Birth/Sex: Treating RN: May 31, 1960 (60 y.o. [name], [name] Primary Care Jerime Arif: [name] Other Clinician: Referring Akeia Perot: Treating Zakaiya Lares/Extender: [name], [name] in Treatment: 5 Visit Information History Since Last Visit Added or deleted any medications: No Patient Arrived: Ambulatory Any new allergies or adverse reactions: No Arrival Time: 07:55 Had a fall or experienced change in No Accompanied By: self activities of daily living that may affect Transfer Assistance: None risk of falls: Patient Identification Verified: Yes Signs or symptoms of abuse/neglect since last visito No Secondary Verification Process Completed: Yes Hospitalized since last visit: No Patient Requires Transmission-Based Precautions: No Implantable device outside of the clinic excluding No Patient Has Alerts: No cellular tissue based products placed in the center since last visit: Pain Present Now: No Electronic Signature(s) Signed: 09/21/2020 6:54:10 PM By: [name] Entered By: [name] on 09/21/2020 10:29:09 -------------------------------------------------------------------------------- Encounter Discharge Information Details Patient Name: Date of Service: [name] 09/21/2020 8:00 A M Medical Record Number: [id number] Patient Account Number: [id number] Date of Birth/Sex: Treating RN: 01-28-1961 (60 y.o. [name] Primary Care Majel Giel: [name] Other Clinician: Referring Hyacinth Marcelli: Treating Kelvyn Schunk/Extender: [name], [name] in Treatment: 5 Encounter Discharge Information Items Discharge Condition: Stable Ambulatory Status: Ambulatory Discharge Destination:  Home Transportation: Private Auto Accompanied By: self Schedule Follow-up Appointment: No Clinical Summary of Care: Electronic Signature(s) Signed: 09/21/2020 6:54:10 PM By: [name] Entered By: [name] on 09/21/2020 10:31:29 -------------------------------------------------------------------------------- Patient/Caregiver Education Details Patient Name: Date of Service: Eugenia Mcalpine D. 5/4/2022andnbsp8:00 A M Medical Record Number: [id number] Patient Account Number: [id number] Date of Birth/Gender: Treating RN: 1960-11-02 (60 y.o. [name] Primary Care Physician: [name] Other Clinician: Referring Physician: Treating Physician/Extender: [name], [name] in Treatment: 5 Education Assessment Education Provided To: Patient Education Topics Provided Wound/Skin Impairment: Handouts: Skin Care Do's and Dont's Methods: Explain/Verbal Responses: Reinforcements needed Electronic Signature(s) Signed: 09/21/2020 6:54:10 PM By: [name] Entered By: [name] on 09/21/2020 10:31:19 -------------------------------------------------------------------------------- Vitals Details Patient Name: Date of Service: [name], [name] 09/21/2020 8:00 A M Medical Record Number: [id number] Patient Account Number: [id number] Date of Birth/Sex: Treating RN: 1960/05/22 (60 y.o. [name], [name] Primary Care Leiana Rund: [name] Other Clinician: Referring Aniqua Briere: Treating Lasaro Primm/Extender: [name], [name] in Treatment: 5 Vital Signs Time Taken: 07:55 Temperature (F): 98.2 Height (in): 65 Pulse (bpm): 67 Weight (lbs): 175 Respiratory Rate (breaths/min): 18 Body Mass Index (BMI): 29.1 Blood Pressure (mmHg): 127/74 Reference Range: 80 - 120 mg / dl Electronic Signature(s) Signed: 09/21/2020 6:54:10 PM By: [name] Entered By: [name] on 09/21/2020 10:29:21

## 2020-09-21 NOTE — Progress Notes (Signed)
Nancy, [name] ([id number]) Visit Report for 09/21/2020 HBO Details Patient Name: Date of Service: [place], [name] 09/21/2020 8:00 A M Medical Record Number: [id number] Patient Account Number: [id number] Date of Birth/Sex: Treating RN: 06-25-1960 (60 y.o. 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 HBO Treatment Details Treatment Number: 27 Patient Type: Outpatient Chamber Type: Monoplace Chamber Serial #: [id number] Treatment Protocol: 2.5 ATA with 90 minutes oxygen, with two 5 minute air breaks Treatment Details Compression Rate Down: 2.0 psi / minute De-Compression Rate Up: 2.0 psi / minute A breaks and breathing ir Compress Tx Pressure periods Decompress Decompress Begins Reached (leave unused spaces Begins Ends blank) Chamber Pressure (ATA 1 2.5 2.5 2.5 2.5 2.5 - - 2.5 1 ) Clock Time (24 hr) 08:05 08:18 08:48 08:53 09:23 09:28 - - 09:58 10:08 Treatment Length: 123 (minutes) Treatment Segments: 4 Vital Signs Capillary Blood Glucose Reference Range: 80 - 120 mg / dl HBO Diabetic Blood Glucose Intervention Range: <131 mg/dl or >249 mg/dl Time Vitals Blood Respiratory Capillary Blood Glucose Pulse Action Type: Pulse: Temperature: Taken: Pressure: Rate: Glucose (mg/dl): Meter #: Oximetry (%) Taken: Pre 07:55 127/74 67 18 98.2 Post 10:09 140/80 55 18 98 Treatment Response Treatment Toleration: Well Treatment Completion Status: Treatment Completed without Adverse Event Electronic Signature(s) Signed: 09/21/2020 6:39:46 PM By: Worthy Keeler PA-C Signed: 09/21/2020 6:54:10 PM By: [name] Entered By: [name] on 09/21/2020  10:30:59 -------------------------------------------------------------------------------- HBO Safety Checklist Details Patient Name: Date of Service: Nancy [name], Nancy [name] NA D. 09/21/2020 8:00 A M Medical Record Number: [id number] Patient Account Number: [id number] Date of Birth/Sex: Treating RN: [birth year] (60 y.o. Nancy [name], [name] Primary Care Janayah Zavada: [name] Other Clinician: Referring Robby Pirani: Treating Kenishia Plack/Extender: [name], [name] in Treatment: 5 HBO Safety Checklist Items Safety Checklist Consent Form Signed Patient voided / foley secured and emptied When did you last eato last night Last dose of injectable or oral agent n/a Ostomy pouch emptied and vented if applicable NA All implantable devices assessed, documented and approved NA Intravenous access site secured and place NA Valuables secured Linens and cotton and cotton/polyester blend (less than 51% polyester) Personal oil-based products / skin lotions / body lotions removed NA Wigs or hairpieces removed NA Smoking or tobacco materials removed NA Books / newspapers / magazines / loose paper removed NA Cologne, aftershave, perfume and deodorant removed NA Jewelry removed (may wrap wedding band) Make-up removed NA Hair care products removed NA Battery operated devices (external) removed NA Heating patches and chemical warmers removed NA Titanium eyewear removed Nail polish cured greater than 10 hours NA Casting material cured greater than 10 hours NA Hearing aids removed NA Loose dentures or partials removed NA Prosthetics have been removed NA Patient demonstrates correct use of air break device (if applicable) Patient concerns have been addressed Patient grounding bracelet on and cord attached to chamber Specifics for Inpatients (complete in addition to above) Medication sheet sent with patient Intravenous medications needed or due during therapy sent with  patient Drainage tubes (e.g. nasogastric tube or chest tube secured and vented) Endotracheal or Tracheotomy tube secured Cuff deflated of air and inflated with saline Airway suctioned Electronic Signature(s) Signed: 09/21/2020 6:54:10 PM By: [name] Entered By: [name] on 09/21/2020  10:29:46 

## 2020-09-22 ENCOUNTER — Other Ambulatory Visit: Payer: Self-pay

## 2020-09-22 ENCOUNTER — Encounter (HOSPITAL_BASED_OUTPATIENT_CLINIC_OR_DEPARTMENT_OTHER): Payer: Medicare Other | Admitting: Internal Medicine

## 2020-09-22 DIAGNOSIS — M272 Inflammatory conditions of jaws: Secondary | ICD-10-CM | POA: Diagnosis not present

## 2020-09-22 NOTE — Progress Notes (Signed)
Nancy [name] ([id number]) Visit Report for 09/22/2020 Arrival Information Details Patient Name: Date of Service: Nancy [name], [name] 09/22/2020 8:00 A M Medical Record Number: [id number] Patient Account Number: [id number] Date of Birth/Sex: Treating RN: Nov 09, 1960 (60 y.o. Nancy [name] Primary Care Nancy [name]: Nancy [name] Other Clinician: Referring Nancy [name]: Treating Nancy [name]/Extender: Nancy [name] in Treatment: 6 Visit Information History Since Last Visit Added or deleted any medications: No Patient Arrived: Ambulatory Any new allergies or adverse reactions: No Arrival Time: 07:58 Had a fall or experienced change in No Accompanied By: alone activities of daily living that may affect Transfer Assistance: None risk of falls: Patient Identification Verified: Yes Signs or symptoms of abuse/neglect since last visito No Secondary Verification Process Completed: Yes Hospitalized since last visit: No Patient Requires Transmission-Based Precautions: No Implantable device outside of the clinic excluding No Patient Has Alerts: No cellular tissue based products placed in the center since last visit: Pain Present Now: No Electronic Signature(s) Signed: 09/22/2020 5:41:41 PM By: Nancy Hurst RN, BSN Entered By: Nancy [name] on 09/22/2020 10:56:35 -------------------------------------------------------------------------------- Encounter Discharge Information Details Patient Name: Date of Service: Nancy Hedge NA D. 09/22/2020 8:00 A M Medical Record Number: [id number] Patient Account Number: [id number] Date of Birth/Sex: Treating RN: 1961-01-02 (60 y.o. Nancy [name] Primary Care Nancy [name]: Nancy [name] Other Clinician: Referring Nancy [name]: Treating Nancy [name]/Extender: Nancy [name] in Treatment: 6 Encounter Discharge Information Items Discharge Condition: Stable Ambulatory Status: Ambulatory Discharge Destination:  Home Transportation: Private Auto Accompanied By: alone Schedule Follow-up Appointment: Yes Clinical Summary of Care: Patient Declined Electronic Signature(s) Signed: 09/22/2020 5:41:41 PM By: Nancy Hurst RN, BSN Entered By: Nancy [name] on 09/22/2020 10:59:05 -------------------------------------------------------------------------------- Vitals Details Patient Name: Date of Service: Nancy Field, [name] 09/22/2020 8:00 A M Medical Record Number: [id number] Patient Account Number: [id number] Date of Birth/Sex: Treating RN: [birth year] (60 y.o. Nancy [name] Primary Care Nancy [name]: Nancy [name] Other Clinician: Referring Nancy [name]: Treating Nancy [name]/Extender: Nancy [name] in Treatment: 6 Vital Signs Time Taken: 07:58 Temperature (F): 98.0 Height (in): 65 Pulse (bpm): 73 Weight (lbs): 175 Respiratory Rate (breaths/min): 16 Body Mass Index (BMI): 29.1 Blood Pressure (mmHg): 133/83 Reference Range: 80 - 120 mg / dl Electronic Signature(s) Signed: 09/22/2020 5:41:41 PM By: Nancy Hurst RN, BSN Entered By: Nancy [name] on 09/22/2020 10:56:54

## 2020-09-22 NOTE — Progress Notes (Signed)
Nancy [name], Nancy [name] ([id number]) Visit Report for 09/22/2020 HBO Details Patient Name: Date of Service: [place], [name] 09/22/2020 8:00 A M Medical Record Number: [id number] Patient Account Number: [id number] Date of Birth/Sex: Treating RN: 1961-01-02 (60 y.o. 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 HBO Treatment Details Treatment Number: 28 Patient Type: Outpatient Chamber Type: Monoplace Chamber Serial #: [id number] Treatment Protocol: 2.5 ATA with 90 minutes oxygen, with two 5 minute air breaks Treatment Details Compression Rate Down: 2.0 psi / minute De-Compression Rate Up: 2.0 psi / minute A breaks and breathing ir Compress Tx Pressure periods Decompress Decompress Begins Reached (leave unused spaces Begins Ends blank) Chamber Pressure (ATA 1 2.5 2.5 2.5 2.5 2.5 - - 2.5 1 ) Clock Time (24 hr) 08:06 08:18 08:48 08:53 09:23 09:28 - - 09:58 10:10 Treatment Length: 124 (minutes) Treatment Segments: 4 Vital Signs Capillary Blood Glucose Reference Range: 80 - 120 mg / dl HBO Diabetic Blood Glucose Intervention Range: <131 mg/dl or >249 mg/dl Time Vitals Blood Respiratory Capillary Blood Glucose Pulse Action Type: Pulse: Temperature: Taken: Pressure: Rate: Glucose (mg/dl): Meter #: Oximetry (%) Taken: Pre 07:58 133/83 73 16 98 Post 10:10 145/86 62 18 97.9 Treatment Response Treatment Completion Status: Treatment Completed without Adverse Event Viridiana Spaid Notes No concerns with treatment given Physician HBO Attestation: I certify that I supervised this HBO treatment in accordance with Medicare guidelines. A trained emergency  response team is readily available per Yes hospital policies and procedures. Continue HBOT as ordered. Yes Electronic Signature(s) Signed: 09/22/2020 5:06:47 PM By: Linton Ham MD Entered By: Linton Ham on 09/22/2020 17:05:32 -------------------------------------------------------------------------------- HBO Safety Checklist Details Patient Name: Date of Service: Nancy Hedge NA D. 09/22/2020 8:00 A M Medical Record Number: [id number] Patient Account Number: [id number] Date of Birth/Sex: Treating RN: 06-19-1960 (60 y.o. Nancy [name] Primary Care Mayelin Panos: [name] Other Clinician: Referring Garron Eline: Treating Shameeka Silliman/Extender: Rosana Hoes, [name] in Treatment: 6 HBO Safety Checklist Items Safety Checklist Consent Form Signed Patient voided / foley secured and emptied When did you last eato 0645 Last dose of injectable or oral agent na Ostomy pouch emptied and vented if applicable NA All implantable devices assessed, documented and approved NA Intravenous access site secured and place NA Valuables secured Linens and cotton and cotton/polyester blend (less than 51% polyester) Personal oil-based products / skin lotions / body lotions removed Wigs or hairpieces removed Smoking or tobacco materials removed Books / newspapers / magazines / loose paper removed Cologne, aftershave, perfume and deodorant removed Jewelry removed (may wrap wedding band) Make-up removed Hair care products removed Battery operated devices (external) removed Heating patches and chemical warmers removed Titanium eyewear removed Nail polish cured greater than 10 hours Casting material cured greater than 10 hours NA Hearing aids removed NA Loose dentures or partials removed NA Prosthetics have been removed NA Patient demonstrates correct use of air break device (if applicable) Patient concerns have been addressed Patient grounding bracelet on and cord  attached to chamber Specifics for Inpatients (complete in addition to above) Medication sheet sent with patient NA Intravenous medications needed or due during therapy sent with patient NA Drainage tubes (e.g. nasogastric tube or chest tube secured and vented) NA Endotracheal or Tracheotomy tube secured NA Cuff deflated of  air and inflated with saline NA Airway suctioned NA Electronic Signature(s) Signed: 09/22/2020 5:41:41 PM By: Levan Hurst RN, BSN Entered By: Levan Hurst on 09/22/2020 10:57:38

## 2020-09-22 NOTE — Progress Notes (Signed)
Nancy [name], Nancy [name] ([id number]) Visit Report for 09/22/2020 SuperBill Details Patient Name: Date of Service: Nancy [name], Nancy [name] Nancy D. 09/22/2020 Medical Record Number: [id number] Patient Account Number: [id number] Date of Birth/Sex: Treating RN: 1961/05/14 (60 y.o. 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 Facility Procedures CPT4 Code Description Modifier Quantity 41638453 G0277-(Facility Use Only) HBOT full body chamber, 30min , 4 Physician Procedures Quantity CPT4 Code Description Modifier [id number] 12248 - WC PHYS HYPERBARIC OXYGEN THERAPY 1 ICD-10 Diagnosis Description 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

## 2020-09-23 ENCOUNTER — Encounter (HOSPITAL_BASED_OUTPATIENT_CLINIC_OR_DEPARTMENT_OTHER): Payer: Medicare Other | Admitting: Internal Medicine

## 2020-09-23 ENCOUNTER — Other Ambulatory Visit: Payer: Self-pay

## 2020-09-23 DIAGNOSIS — M272 Inflammatory conditions of jaws: Secondary | ICD-10-CM | POA: Diagnosis not present

## 2020-09-23 NOTE — Progress Notes (Signed)
AMMA, CREAR ([id number]) Visit Report for 09/06/2020 SuperBill Details Patient Name: Date of Service: [place], [name] 09/06/2020 Medical Record Number: [id number] Patient Account Number: [id number] Date of Birth/Sex: Treating RN: 07-16-1960 (60 y.o. 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 Encounter for antineoplastic radiation therapy L59.8 Other specified disorders of the skin and subcutaneous tissue related to radiation Facility Procedures CPT4 Code Description Modifier Quantity 45859292 G0277-(Facility Use Only) HBOT full body chamber, 11min , 4 Physician Procedures Quantity CPT4 Code Description Modifier 4462863 81771 - WC PHYS HYPERBARIC OXYGEN THERAPY 1 ICD-10 Diagnosis Description 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

## 2020-09-23 NOTE — Progress Notes (Signed)
ACASIA, SKILTON ([id number]) Visit Report for 09/06/2020 HBO Details Patient Name: Date of Service: [place], [name] 09/06/2020 8:00 A M Medical Record Number: [id number] Patient Account Number: [id number] Date of Birth/Sex: Treating RN: Aug 13, 1960 (60 y.o. 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 HBO Treatment Details Treatment Number: 17 Patient Type: Outpatient Chamber Type: Monoplace Chamber Serial #: [id number] Treatment Protocol: 2.5 ATA with 90 minutes oxygen, with two 5 minute air breaks Treatment Details Compression Rate Down: 2.0 psi / minute De-Compression Rate Up: 2.0 psi / minute A breaks and breathing ir Compress Tx Pressure periods Decompress Decompress Begins Reached (leave unused spaces Begins Ends blank) Chamber Pressure (ATA 1 2.5 2.5 2.5 2.5 2.5 - - 2.5 1 ) Clock Time (24 hr) 08:12 08:24 08:54 08:59 09:29 09:34 - - 10:04 10:16 Treatment Length: 124 (minutes) Treatment Segments: 4 Vital Signs Capillary Blood Glucose Reference Range: 80 - 120 mg / dl HBO Diabetic Blood Glucose Intervention Range: <131 mg/dl or >249 mg/dl Time Vitals Blood Respiratory Capillary Blood Glucose Pulse Action Type: Pulse: Temperature: Taken: Pressure: Rate: Glucose (mg/dl): Meter #: Oximetry (%) Taken: Pre 07:58 135/77 68 16 98.1 Post 10:16 150/74 63 16 97.8 Treatment Response Treatment Completion Status: Treatment Completed without Adverse Event Electronic Signature(s) Signed: 09/08/2020 5:27:18 PM By: Levan Hurst RN, BSN Signed: 09/23/2020 4:10:40 PM By: Kalman Shan DO Entered By: Levan Hurst on 09/06/2020  11:14:30 -------------------------------------------------------------------------------- HBO Safety Checklist Details Patient Name: Date of Service: [name], [name] 09/06/2020 8:00 A M Medical Record Number: [id number] Patient Account Number: [id number] Date of Birth/Sex: Treating RN: 11-16-1960 (60 y.o. Nancy Fetter Primary Care Aldea Avis: [name] Other Clinician: Referring Nehemiah Mcfarren: Treating Amiah Frohlich/Extender: Claris Gladden in Treatment: 3 HBO Safety Checklist Items Safety Checklist Consent Form Signed Patient voided / foley secured and emptied When did you last eato 0700 Last dose of injectable or oral agent na Ostomy pouch emptied and vented if applicable NA All implantable devices assessed, documented and approved NA Intravenous access site secured and place NA Valuables secured Linens and cotton and cotton/polyester blend (less than 51% polyester) Personal oil-based products / skin lotions / body lotions removed Wigs or hairpieces removed Smoking or tobacco materials removed Books / newspapers / magazines / loose paper removed Cologne, aftershave, perfume and deodorant removed Jewelry removed (may wrap wedding band) Make-up removed Hair care products removed Battery operated devices (external) removed Heating patches and chemical warmers removed Titanium eyewear removed Nail polish cured greater than 10 hours Casting material cured greater than 10 hours NA Hearing aids removed NA Loose dentures or partials removed NA Prosthetics have been removed NA Patient demonstrates correct use of air break device (if applicable) Patient concerns have been addressed Patient grounding bracelet on and cord attached to chamber Specifics for Inpatients (complete in addition to above) Medication sheet sent with patient NA Intravenous medications needed or due during therapy sent with patient NA Drainage tubes (e.g. nasogastric tube or  chest tube secured and vented) NA Endotracheal or Tracheotomy tube secured NA Cuff deflated of air and inflated with saline NA Airway suctioned NA Electronic Signature(s) Signed: 09/08/2020 5:27:18 PM By: Levan Hurst RN, BSN Entered By: Levan Hurst on 09/06/2020 11:12:23

## 2020-09-26 ENCOUNTER — Encounter (HOSPITAL_BASED_OUTPATIENT_CLINIC_OR_DEPARTMENT_OTHER): Payer: Medicare Other | Admitting: Internal Medicine

## 2020-09-26 ENCOUNTER — Other Ambulatory Visit: Payer: Self-pay

## 2020-09-26 DIAGNOSIS — M272 Inflammatory conditions of jaws: Secondary | ICD-10-CM | POA: Diagnosis not present

## 2020-09-26 NOTE — Progress Notes (Signed)
Nancy [name], Nancy [name] ([id number]) Visit Report for 09/26/2020 HBO Details Patient Name: Date of Service: [name], [name] 09/26/2020 8:00 A M Medical Record Number: [id number] Patient Account Number: [id number] Date of Birth/Sex: Treating RN: Aug 18, 1960 (60 y.o. 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 HBO Treatment Details Treatment Number: 30 Patient Type: Outpatient Chamber Type: Monoplace Chamber Serial #: [id number] Treatment Protocol: 2.5 ATA with 90 minutes oxygen, with two 5 minute air breaks Treatment Details Compression Rate Down: 2.0 psi / minute De-Compression Rate Up: 2.0 psi / minute A breaks and breathing ir Compress Tx Pressure periods Decompress Decompress Begins Reached (leave unused spaces Begins Ends blank) Chamber Pressure (ATA 1 2.5 2.5 2.5 2.5 2.5 - - 2.5 1 ) Clock Time (24 hr) 08:03 08:16 08:46 08:51 09:21 09:26 - - 09:56 10:06 Treatment Length: 123 (minutes) Treatment Segments: 4 Vital Signs Capillary Blood Glucose Reference Range: 80 - 120 mg / dl HBO Diabetic Blood Glucose Intervention Range: <131 mg/dl or >249 mg/dl Time Vitals Blood Respiratory Capillary Blood Glucose Pulse Action Type: Pulse: Temperature: Taken: Pressure: Rate: Glucose (mg/dl): Meter #: Oximetry (%) Taken: Pre 07:52 152/92 76 18 98.1 Post 10:07 153/94 58 18 98 Treatment Response Treatment Toleration: Well Treatment Completion Status: Treatment Completed without Adverse Event Ivey Nembhard Notes No concerns with treatment given Physician HBO Attestation: I certify that I supervised this HBO treatment in accordance with Medicare guidelines.  A trained emergency response team is readily available per Yes hospital policies and procedures. Continue HBOT as ordered. Yes Electronic Signature(s) Signed: 09/26/2020 4:39:47 PM By: Linton Ham MD Entered By: Linton Ham on 09/26/2020 15:29:33 -------------------------------------------------------------------------------- HBO Safety Checklist Details Patient Name: Date of Service: Nancy [name] NA D. 09/26/2020 8:00 A M Medical Record Number: [id number] Patient Account Number: [id number] Date of Birth/Sex: Treating RN: [birth year] (60 y.o. Nancy [name], Nancy [name] Primary Care Alynah Schone: [name] Other Clinician: Referring Devan Danzer: Treating Brevan Luberto/Extender: Rosana Hoes, [name] in Treatment: 6 HBO Safety Checklist Items Safety Checklist Consent Form Signed Patient voided / foley secured and emptied When did you last eato last night Last dose of injectable or oral agent n/a Ostomy pouch emptied and vented if applicable NA All implantable devices assessed, documented and approved NA Intravenous access site secured and place NA Valuables secured Linens and cotton and cotton/polyester blend (less than 51% polyester) Personal oil-based products / skin lotions / body lotions removed NA Wigs or hairpieces removed NA Smoking or tobacco materials removed NA Books / newspapers / magazines / loose paper removed NA Cologne, aftershave, perfume and deodorant removed NA Jewelry removed (may wrap wedding band) Make-up removed NA Hair care products removed NA Battery operated devices (external) removed NA Heating patches and chemical warmers removed NA Titanium eyewear removed Nail polish cured greater than 10 hours NA Casting material cured greater than 10 hours NA Hearing aids removed NA Loose dentures or partials removed NA Prosthetics have been removed NA Patient demonstrates correct use of air break device (if applicable) Patient concerns  have been addressed Patient grounding bracelet on and cord attached to chamber Specifics for Inpatients (complete in addition to above) Medication sheet sent with patient Intravenous medications needed or due during therapy sent with patient Drainage tubes (e.g. nasogastric tube or chest tube secured  and vented) Endotracheal or Tracheotomy tube secured Cuff deflated of air and inflated with saline Airway suctioned Electronic Signature(s) Signed: 09/26/2020 6:03:56 PM By: [name] Entered By: [name] on 09/26/2020 10:33:44

## 2020-09-26 NOTE — Progress Notes (Signed)
TEDDI, BADALAMENTI ([id number]) Visit Report for 09/23/2020 SuperBill Details Patient Name: Date of Service: [place], [name] 09/23/2020 Medical Record Number: [id number] Patient Account Number: [id number] Date of Birth/Sex: Treating RN: December 09, 1960 (60 y.o. 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 Encounter for antineoplastic radiation therapy L59.8 Other specified disorders of the skin and subcutaneous tissue related to radiation Facility Procedures CPT4 Code Description Modifier Quantity 60109323 G0277-(Facility Use Only) HBOT full body chamber, 56min , 4 Physician Procedures Quantity CPT4 Code Description Modifier 5573220 25427 - WC PHYS HYPERBARIC OXYGEN THERAPY 1 ICD-10 Diagnosis Description 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

## 2020-09-26 NOTE — Progress Notes (Signed)
ALTOVISE, WAHLER ([id number]) Visit Report for 09/23/2020 Arrival Information Details Patient Name: Date of Service: [place], [name] 09/23/2020 8:00 A M Medical Record Number: [id number] Patient Account Number: [id number] Date of Birth/Sex: Treating RN: 03/06/1961 (60 y.o. Nancy Fetter Primary Care Nathin Saran: [name] Other Clinician: Referring Britt Petroni: Treating Yarely Bebee/Extender: Rosana Hoes, [name] in Treatment: 6 Visit Information History Since Last Visit Added or deleted any medications: No Patient Arrived: Ambulatory Any new allergies or adverse reactions: No Arrival Time: 07:55 Had a fall or experienced change in No Accompanied By: alone activities of daily living that may affect Transfer Assistance: None risk of falls: Patient Identification Verified: Yes Signs or symptoms of abuse/neglect since last visito No Secondary Verification Process Completed: Yes Hospitalized since last visit: No Patient Requires Transmission-Based Precautions: No Implantable device outside of the clinic excluding No Patient Has Alerts: No cellular tissue based products placed in the center since last visit: Pain Present Now: No Electronic Signature(s) Signed: 09/26/2020 5:39:20 PM By: Levan Hurst RN, BSN Entered By: Levan Hurst on 09/23/2020 10:04:14 -------------------------------------------------------------------------------- Encounter Discharge Information Details Patient Name: Date of Service: [name] 09/23/2020 8:00 A M Medical Record Number: [id number] Patient Account Number: [id number] Date of Birth/Sex: Treating RN: 14-Mar-1961 (60 y.o. Nancy Fetter Primary Care Harlan Vinal: [name] Other Clinician: Referring Okla Qazi: Treating Keili Hasten/Extender: Cleophas Dunker in Treatment: 6 Encounter Discharge Information Items Discharge Condition: Stable Ambulatory Status: Ambulatory Discharge Destination:  Home Transportation: Private Auto Accompanied By: alone Schedule Follow-up Appointment: Yes Clinical Summary of Care: Patient Declined Electronic Signature(s) Signed: 09/26/2020 5:39:20 PM By: Levan Hurst RN, BSN Entered By: Levan Hurst on 09/23/2020 10:59:21 -------------------------------------------------------------------------------- Vitals Details Patient Name: Date of Service: [name], [name] 09/23/2020 8:00 A M Medical Record Number: [id number] Patient Account Number: [id number] Date of Birth/Sex: Treating RN: 1960-07-02 (60 y.o. Nancy Fetter Primary Care Jamon Hayhurst: [name] Other Clinician: Referring Ceasar Decandia: Treating Tannya Gonet/Extender: Rosana Hoes, [name] in Treatment: 6 Vital Signs Time Taken: 07:55 Temperature (F): 97.9 Height (in): 65 Pulse (bpm): 79 Weight (lbs): 175 Respiratory Rate (breaths/min): 18 Body Mass Index (BMI): 29.1 Blood Pressure (mmHg): 157/99 Reference Range: 80 - 120 mg / dl Electronic Signature(s) Signed: 09/26/2020 5:39:20 PM By: Levan Hurst RN, BSN Entered By: Levan Hurst on 09/23/2020 10:04:29

## 2020-09-26 NOTE — Progress Notes (Signed)
RONNICA, DREESE ([id number]) Visit Report for 09/26/2020 SuperBill Details Patient Name: Date of Service: [place], [name] 09/26/2020 Medical Record Number: [id number] Patient Account Number: [id number] Date of Birth/Sex: Treating RN: 12/09/1960 (60 y.o. 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 Encounter for antineoplastic radiation therapy L59.8 Other specified disorders of the skin and subcutaneous tissue related to radiation Facility Procedures CPT4 Code Description Modifier Quantity [id number] G0277-(Facility Use Only) HBOT full body chamber, 64min , 4 Physician Procedures Quantity CPT4 Code Description Modifier [id number] 89169 - WC PHYS HYPERBARIC OXYGEN THERAPY 1 ICD-10 Diagnosis Description Z51.0 Encounter for antineoplastic radiation therapy L59.8 Other specified disorders of the skin and subcutaneous tissue related to radiation 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

## 2020-09-26 NOTE — Progress Notes (Signed)
ELLISHA, BANKSON ([id number]) Visit Report for 09/26/2020 Arrival Information Details Patient Name: Date of Service: [place], [name] 09/26/2020 8:00 A M Medical Record Number: [id number] Patient Account Number: [id number] Date of Birth/Sex: Treating RN: 01-03-1961 (60 y.o. [name], [name] Primary Care Neta Upadhyay: [name] Other Clinician: Referring Dorthia Tout: Treating Hayat Warbington/Extender: Cleophas Dunker in Treatment: 6 Visit Information History Since Last Visit Added or deleted any medications: No Patient Arrived: Ambulatory Any new allergies or adverse reactions: No Arrival Time: 07:52 Had a fall or experienced change in No Accompanied By: self activities of daily living that may affect Transfer Assistance: None risk of falls: Patient Identification Verified: Yes Signs or symptoms of abuse/neglect since last visito No Secondary Verification Process Completed: Yes Hospitalized since last visit: No Patient Requires Transmission-Based Precautions: No Implantable device outside of the clinic excluding No Patient Has Alerts: No cellular tissue based products placed in the center since last visit: Pain Present Now: No Electronic Signature(s) Signed: 09/26/2020 6:03:56 PM By: [name] Entered By: [name] on 09/26/2020 10:32:48 -------------------------------------------------------------------------------- Encounter Discharge Information Details Patient Name: Date of Service: [name], [name] 09/26/2020 8:00 A M Medical Record Number: [id number] Patient Account Number: [id number] Date of Birth/Sex: Treating RN: [birth year] (60 y.o. [name] Primary Care Michalla Ringer: [name] Other Clinician: Referring Eisa Necaise: Treating Rolf Fells/Extender: Cleophas Dunker in Treatment: 6 Encounter Discharge Information Items Discharge Condition: Stable Ambulatory Status: Ambulatory Discharge Destination:  Home Transportation: Private Auto Accompanied By: self Schedule Follow-up Appointment: Yes Clinical Summary of Care: Electronic Signature(s) Signed: 09/26/2020 6:03:56 PM By: [name] Entered By: [name] on 09/26/2020 10:35:18 -------------------------------------------------------------------------------- Patient/Caregiver Education Details Patient Name: Date of Service: Eugenia Mcalpine D. 5/9/2022andnbsp8:00 A M Medical Record Number: [id number] Patient Account Number: [id number] Date of Birth/Gender: Treating RN: [birth year] (60 y.o. [name] Primary Care Physician: [name] Other Clinician: Referring Physician: Treating Physician/Extender: Cleophas Dunker in Treatment: 6 Education Assessment Education Provided To: Patient Education Topics Provided Hyperbaric Oxygenation: Handouts: Hyperbaric Oxygen Methods: Explain/Verbal Responses: Reinforcements needed Electronic Signature(s) Signed: 09/26/2020 6:03:56 PM By: [name] Entered By: [name] on 09/26/2020 10:35:07 -------------------------------------------------------------------------------- Vitals Details Patient Name: Date of Service: [name], [name] 09/26/2020 8:00 A M Medical Record Number: [id number] Patient Account Number: [id number] Date of Birth/Sex: Treating RN: 09-Jan-1961 (60 y.o. [name], [name] Primary Care Adylene Dlugosz: [name] Other Clinician: Referring Viggo Perko: Treating Katrinia Straker/Extender: Rosana Hoes, [name] in Treatment: 6 Vital Signs Time Taken: 07:52 Temperature (F): 98.1 Height (in): 65 Pulse (bpm): 76 Weight (lbs): 175 Respiratory Rate (breaths/min): 18 Body Mass Index (BMI): 29.1 Blood Pressure (mmHg): 152/92 Reference Range: 80 - 120 mg / dl Electronic Signature(s) Signed: 09/26/2020 6:03:56 PM By: [name] Entered By: [name] on 09/26/2020 10:33:11

## 2020-09-26 NOTE — Progress Notes (Signed)
AANIKA, Nancy [name] ([id number]) Visit Report for 09/23/2020 HBO Details Patient Name: Date of Service: [place], [name] 09/23/2020 8:00 A M Medical Record Number: [id number] Patient Account Number: [id number] Date of Birth/Sex: Treating RN: February 17, 1961 (60 y.o. 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 HBO Treatment Details Treatment Number: 29 Patient Type: Outpatient Chamber Type: Monoplace Chamber Serial #: [id number] Treatment Protocol: 2.5 ATA with 90 minutes oxygen, with two 5 minute air breaks Treatment Details Compression Rate Down: 2.0 psi / minute De-Compression Rate Up: 2.0 psi / minute A breaks and breathing ir Compress Tx Pressure periods Decompress Decompress Begins Reached (leave unused spaces Begins Ends blank) Chamber Pressure (ATA 1 2.5 2.5 2.5 2.5 2.5 - - 2.5 1 ) Clock Time (24 hr) 08:02 08:14 08:44 08:49 09:19 09:24 - - 09:54 10:06 Treatment Length: 124 (minutes) Treatment Segments: 4 Vital Signs Capillary Blood Glucose Reference Range: 80 - 120 mg / dl HBO Diabetic Blood Glucose Intervention Range: <131 mg/dl or >249 mg/dl Time Vitals Blood Respiratory Capillary Blood Glucose Pulse Action Type: Pulse: Temperature: Taken: Pressure: Rate: Glucose (mg/dl): Meter #: Oximetry (%) Taken: Pre 07:55 157/99 79 18 97.9 Post 10:06 158/97 63 18 97.8 Treatment Response Treatment Completion Status: Treatment Completed without Adverse Event Odena Mcquaid Notes No concerns with treatment given Physician HBO Attestation: I certify that I supervised this HBO treatment in accordance with Medicare guidelines. A trained emergency  response team is readily available per Yes hospital policies and procedures. Continue HBOT as ordered. Yes Electronic Signature(s) Signed: 09/23/2020 4:39:58 PM By: Linton Ham MD Entered By: Linton Ham on 09/23/2020 16:00:25 -------------------------------------------------------------------------------- HBO Safety Checklist Details Patient Name: Date of Service: Nancy [name] NA D. 09/23/2020 8:00 A M Medical Record Number: [id number] Patient Account Number: [id number] Date of Birth/Sex: Treating RN: February 05, 1961 (60 y.o. Nancy [name] Primary Care Ricci Paff: [name] Other Clinician: Referring Emory Gallentine: Treating Giulietta Prokop/Extender: Rosana Hoes, [name] in Treatment: 6 HBO Safety Checklist Items Safety Checklist Consent Form Signed Patient voided / foley secured and emptied When did you last eato 0700 Last dose of injectable or oral agent na Ostomy pouch emptied and vented if applicable NA All implantable devices assessed, documented and approved NA Intravenous access site secured and place NA Valuables secured Linens and cotton and cotton/polyester blend (less than 51% polyester) Personal oil-based products / skin lotions / body lotions removed Wigs or hairpieces removed Smoking or tobacco materials removed Books / newspapers / magazines / loose paper removed Cologne, aftershave, perfume and deodorant removed Jewelry removed (may wrap wedding band) Make-up removed Hair care products removed Battery operated devices (external) removed Heating patches and chemical warmers removed Titanium eyewear removed Nail polish cured greater than 10 hours Casting material cured greater than 10 hours NA Hearing aids removed NA Loose dentures or partials removed NA Prosthetics have been removed NA Patient demonstrates correct use of air break device (if applicable) Patient concerns have been addressed Patient grounding bracelet on and cord  attached to chamber Specifics for Inpatients (complete in addition to above) Medication sheet sent with patient NA Intravenous medications needed or due during therapy sent with patient NA Drainage tubes (e.g. nasogastric tube or chest tube secured and vented) NA Endotracheal or Tracheotomy tube secured NA Cuff deflated of  air and inflated with saline NA Airway suctioned NA Electronic Signature(s) Signed: 09/26/2020 5:39:20 PM By: Levan Hurst RN, BSN Entered By: Levan Hurst on 09/23/2020 10:05:08

## 2020-09-27 ENCOUNTER — Other Ambulatory Visit: Payer: Self-pay

## 2020-09-27 ENCOUNTER — Encounter (HOSPITAL_BASED_OUTPATIENT_CLINIC_OR_DEPARTMENT_OTHER): Payer: Medicare Other | Admitting: Internal Medicine

## 2020-09-27 DIAGNOSIS — M272 Inflammatory conditions of jaws: Secondary | ICD-10-CM | POA: Diagnosis not present

## 2020-09-27 NOTE — Progress Notes (Signed)
DAILYN, REITH ([id number]) Visit Report for 09/15/2020 SuperBill Details Patient Name: Date of Service: DORSIE, SETHI [name] 09/15/2020 Medical Record Number: [id number] Patient Account Number: [id number] Date of Birth/Sex: Treating RN: 08-04-1960 (60 y.o. 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 Facility Procedures CPT4 Code Description Modifier Quantity 77824235 G0277-(Facility Use Only) HBOT full body chamber, 36min , 4 Physician Procedures Quantity CPT4 Code Description Modifier [id number] 54008 - WC PHYS HYPERBARIC OXYGEN THERAPY 1 ICD-10 Diagnosis Description 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

## 2020-09-27 NOTE — Progress Notes (Signed)
ANARELY, NICHOLLS ([id number]) Visit Report for 09/09/2020 HBO Details Patient Name: Date of Service: [name], [name] 09/09/2020 8:00 A M Medical Record Number: [id number] Patient Account Number: [id number] Date of Birth/Sex: Treating RN: Nancy [name], Nancy [name] (60 y.o. 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 HBO Treatment Details Treatment Number: 20 Patient Type: Outpatient Chamber Type: Monoplace Chamber Serial #: [id number] Treatment Protocol: 2.5 ATA with 90 minutes oxygen, with two 5 minute air breaks Treatment Details Compression Rate Down: 2.0 psi / minute De-Compression Rate Up: 2.0 psi / minute A breaks and breathing ir Compress Tx Pressure periods Decompress Decompress Begins Reached (leave unused spaces Begins Ends blank) Chamber Pressure (ATA 1 2.5 2.5 2.5 2.5 2.5 - - 2.5 1 ) Clock Time (24 hr) 08:09 08:20 08:50 08:55 09:25 09:30 - - 10:00 10:10 Treatment Length: 121 (minutes) Treatment Segments: 4 Vital Signs Capillary Blood Glucose Reference Range: 80 - 120 mg / dl HBO Diabetic Blood Glucose Intervention Range: <131 mg/dl or >249 mg/dl Time Vitals Blood Respiratory Capillary Blood Glucose Pulse Action Type: Pulse: Temperature: Taken: Pressure: Rate: Glucose (mg/dl): Meter #: Oximetry (%) Taken: Pre 07:50 149/82 72 18 98.3 Post 10:12 147/82 62 18 98.1 Treatment Response Treatment Toleration: Well Treatment Completion Status: Treatment Completed without Adverse Event Electronic Signature(s) Signed: 09/09/2020 5:09:10 PM By: [name] Signed: 09/27/2020 3:[name]:56 PM By: Kalman Shan DO Entered By: [name] on  09/09/2020 10:29:31 -------------------------------------------------------------------------------- HBO Safety Checklist Details Patient Name: Date of Service: Nancy [name], Nancy NA D. 09/09/2020 8:00 A M Medical Record Number: [id number] Patient Account Number: [id number] Date of Birth/Sex: Treating RN: [birth year] Nancy [name] (60 y.o. [name], [name] Primary Care Dasani Crear: [name] Other Clinician: Referring Ferguson Gertner: Treating Toriano Aikey/Extender: Claris Gladden in Treatment: 4 HBO Safety Checklist Items Safety Checklist Consent Form Signed Patient voided / foley secured and emptied When did you last eato last night Last dose of injectable or oral agent n/a Ostomy pouch emptied and vented if applicable NA All implantable devices assessed, documented and approved NA Intravenous access site secured and place NA Valuables secured Linens and cotton and cotton/polyester blend (less than 51% polyester) Personal oil-based products / skin lotions / body lotions removed NA Wigs or hairpieces removed NA Smoking or tobacco materials removed NA Books / newspapers / magazines / loose paper removed NA Cologne, aftershave, perfume and deodorant removed NA Jewelry removed (may wrap wedding band) Make-up removed NA Hair care products removed NA Battery operated devices (external) removed NA Heating patches and chemical warmers removed NA Titanium eyewear removed Nail polish cured greater than 10 hours NA Casting material cured greater than 10 hours NA Hearing aids removed NA Loose dentures or partials removed NA Prosthetics have been removed NA Patient demonstrates correct use of air break device (if applicable) Patient concerns have been addressed Patient grounding bracelet on and cord attached to chamber Specifics for Inpatients (complete in addition to above) Medication sheet sent with patient Intravenous medications needed or due during therapy sent with  patient Drainage tubes (e.g. nasogastric tube or chest tube secured and vented) Endotracheal or Tracheotomy tube secured Cuff deflated of air and inflated with saline Airway suctioned Electronic Signature(s) Signed: 09/09/2020 5:09:10 PM By: [name] Entered By: [name] on 09/09/2020 10:28:48

## 2020-09-27 NOTE — Progress Notes (Signed)
CASIA, CORTI ([id number]) Visit Report for 09/09/2020 SuperBill Details Patient Name: Date of Service: Nancy [name], Nancy [name] [name] 09/09/2020 Medical Record Number: [id number] Patient Account Number: [id number] Date of Birth/Sex: Treating RN: 02-13-1961 (60 y.o. 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 Facility Procedures CPT4 Code Description Modifier Quantity [id number] G0277-(Facility Use Only) HBOT full body chamber, 82min , 4 Physician Procedures Quantity CPT4 Code Description Modifier [id number] 88916 - WC PHYS HYPERBARIC OXYGEN THERAPY 1 ICD-10 Diagnosis Description 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

## 2020-09-27 NOTE — Progress Notes (Signed)
DASHANIQUE, BROWNSTEIN ([id number]) Visit Report for 09/08/2020 SuperBill Details Patient Name: Date of Service: KAMBRE, MESSNER [name] 09/08/2020 Medical Record Number: [id number] Patient Account Number: [id number] Date of Birth/Sex: Treating RN: 01-23-1961 (60 y.o. 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

## 2020-09-27 NOTE — Progress Notes (Signed)
Nancy [name], Nancy [name] ([id number]) Visit Report for 09/12/2020 HBO Details Patient Name: Date of Service: [place], [name] 09/12/2020 8:00 A M Medical Record Number: [id number] Patient Account Number: [id number] Date of Birth/Sex: Treating RN: 03-13-1961 (60 y.o. 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 HBO Treatment Details Treatment Number: 21 Patient Type: Outpatient Chamber Type: Monoplace Chamber Serial #: [id number] Treatment Protocol: 2.5 ATA with 90 minutes oxygen, with two 5 minute air breaks Treatment Details Compression Rate Down: 2.0 psi / minute De-Compression Rate Up: 2.0 psi / minute A breaks and breathing ir Compress Tx Pressure periods Decompress Decompress Begins Reached (leave unused spaces Begins Ends blank) Chamber Pressure (ATA 1 2.5 2.5 2.5 2.5 2.5 - - 2.5 1 ) Clock Time (24 hr) 08:09 08:20 08:50 08:55 09:25 09:30 - - 10:00 10:10 Treatment Length: 121 (minutes) Treatment Segments: 4 Vital Signs Capillary Blood Glucose Reference Range: 80 - 120 mg / dl HBO Diabetic Blood Glucose Intervention Range: <131 mg/dl or >249 mg/dl Time Vitals Blood Respiratory Capillary Blood Glucose Pulse Action Type: Pulse: Temperature: Taken: Pressure: Rate: Glucose (mg/dl): Meter #: Oximetry (%) Taken: Pre 07:50 123/82 78 18 98 Post 10:15 150/92 64 16 97.6 Treatment Response Treatment Toleration: Well Treatment Completion Status: Treatment Completed without Adverse Event Electronic Signature(s) Signed: 09/12/2020 4:05:30 PM By: [name] Signed: 09/27/2020 3:23:56 PM By: Kalman Shan DO Entered By: [name] on  09/12/2020 10:25:08 -------------------------------------------------------------------------------- HBO Safety Checklist Details Patient Name: Date of Service: [name], [name] 09/12/2020 8:00 A M Medical Record Number: [id number] Patient Account Number: [id number] Date of Birth/Sex: Treating RN: 11-15-1960 (60 y.o. [name], [name] Primary Care Tenzin Pavon: [name] Other Clinician: Referring Tykera Skates: Treating Massai Hankerson/Extender: Claris Gladden in Treatment: 4 HBO Safety Checklist Items Safety Checklist Consent Form Signed Patient voided / foley secured and emptied When did you last eato last night Last dose of injectable or oral agent n/a Ostomy pouch emptied and vented if applicable NA All implantable devices assessed, documented and approved NA Intravenous access site secured and place NA Valuables secured Linens and cotton and cotton/polyester blend (less than 51% polyester) Personal oil-based products / skin lotions / body lotions removed NA Wigs or hairpieces removed NA Smoking or tobacco materials removed NA Books / newspapers / magazines / loose paper removed NA Cologne, aftershave, perfume and deodorant removed NA Jewelry removed (may wrap wedding band) Make-up removed NA Hair care products removed NA Battery operated devices (external) removed NA Heating patches and chemical warmers removed NA Titanium eyewear removed Nail polish cured greater than 10 hours NA Casting material cured greater than 10 hours NA Hearing aids removed NA Loose dentures or partials removed NA Prosthetics have been removed NA Patient demonstrates correct use of air break device (if applicable) Patient concerns have been addressed Patient grounding bracelet on and cord attached to chamber Specifics for Inpatients (complete in addition to above) Medication sheet sent with patient Intravenous medications needed or due during therapy sent with  patient Drainage tubes (e.g. nasogastric tube or chest tube secured and vented) Endotracheal or Tracheotomy tube secured Cuff deflated of air and inflated with saline Airway suctioned Electronic Signature(s) Signed: 09/12/2020 4:05:30 PM By: [name] Entered By: [name] on 09/12/2020 10:10:43

## 2020-09-27 NOTE — Progress Notes (Signed)
BRADLEIGH, SONNEN ([id number]) Visit Report for 09/12/2020 Problem List Details Patient Name: Date of Service: [name] 09/12/2020 8:00 A M Medical Record Number: [id number] Patient Account Number: [id number] Date of Birth/Sex: Treating RN: 1961/03/11 (60 y.o. 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 09/12/2020 Medical Record Number: [id number] Patient Account Number: [id number] Date of Birth/Sex: Treating RN: 1960-11-26 (60 y.o. 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

## 2020-09-27 NOTE — Progress Notes (Signed)
[name], [name] ([id number]) Visit Report for 09/13/2020 SuperBill Details Patient Name: Date of Service: Nancy [name], Nancy [name] [name] 09/13/2020 Medical Record Number: [id number] Patient Account Number: [id number] Date of Birth/Sex: Treating RN: 1960-11-15 (60 y.o. 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 Facility Procedures CPT4 Code Description Modifier Quantity [id number] G0277-(Facility Use Only) HBOT full body chamber, 26min , 4 Physician Procedures Quantity CPT4 Code Description Modifier [id number] 99371 - WC PHYS HYPERBARIC OXYGEN THERAPY 1 ICD-10 Diagnosis Description 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

## 2020-09-27 NOTE — Progress Notes (Signed)
SATIA, WINGER ([id number]) Visit Report for 09/15/2020 HBO Details Patient Name: Date of Service: Nancy [name], Nancy [name] D. 09/15/2020 8:15 A M Medical Record Number: [id number] Patient Account Number: [id number] Date of Birth/Sex: Treating RN: 27-Jul-1960 (60 y.o. 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 HBO Treatment Details Treatment Number: 24 Patient Type: Outpatient Chamber Type: Monoplace Chamber Serial #: [id number] Treatment Protocol: 2.5 ATA with 90 minutes oxygen, with two 5 minute air breaks Treatment Details Compression Rate Down: 2.0 psi / minute De-Compression Rate Up: 2.0 psi / minute A breaks and breathing ir Compress Tx Pressure periods Decompress Decompress Begins Reached (leave unused spaces Begins Ends blank) Chamber Pressure (ATA 1 2.5 2.5 2.5 2.5 2.5 - - 2.5 1 ) Clock Time (24 hr) 08:13 08:25 08:55 09:00 09:30 09:35 - - 10:05 10:17 Treatment Length: 124 (minutes) Treatment Segments: 4 Vital Signs Capillary Blood Glucose Reference Range: 80 - 120 mg / dl HBO Diabetic Blood Glucose Intervention Range: <131 mg/dl or >249 mg/dl Time Vitals Blood Respiratory Capillary Blood Glucose Pulse Action Type: Pulse: Temperature: Taken: Pressure: Rate: Glucose (mg/dl): Meter #: Oximetry (%) Taken: Pre 08:10 132/62 59 16 98.2 Post 10:17 155/86 61 16 98.1 Treatment Response Treatment Completion Status: Treatment Completed without Adverse Event Electronic Signature(s) Signed: 09/19/2020 5:39:21 PM By: Levan Hurst RN, BSN Signed: 09/27/2020 3:23:56 PM By: Kalman Shan DO Entered By: Levan Hurst on 09/15/2020  10:53:52 -------------------------------------------------------------------------------- HBO Safety Checklist Details Patient Name: Date of Service: Nancy [name] NA D. 09/15/2020 8:15 A M Medical Record Number: [id number] Patient Account Number: [id number] Date of Birth/Sex: Treating RN: 08-20-1960 (60 y.o. Nancy [name] Primary Care Kingslee Dowse: [name] Other Clinician: Referring Eugune Sine: Treating Altan Kraai/Extender: Claris Gladden in Treatment: 5 HBO Safety Checklist Items Safety Checklist Consent Form Signed Patient voided / foley secured and emptied When did you last eato 0700 Last dose of injectable or oral agent na Ostomy pouch emptied and vented if applicable NA All implantable devices assessed, documented and approved Intravenous access site secured and place NA Valuables secured Linens and cotton and cotton/polyester blend (less than 51% polyester) Personal oil-based products / skin lotions / body lotions removed Wigs or hairpieces removed Smoking or tobacco materials removed Books / newspapers / magazines / loose paper removed Cologne, aftershave, perfume and deodorant removed Jewelry removed (may wrap wedding band) Make-up removed Hair care products removed Battery operated devices (external) removed Heating patches and chemical warmers removed Titanium eyewear removed Nail polish cured greater than 10 hours Casting material cured greater than 10 hours NA Hearing aids removed NA Loose dentures or partials removed NA Prosthetics have been removed NA Patient demonstrates correct use of air break device (if applicable) Patient concerns have been addressed Patient grounding bracelet on and cord attached to chamber Specifics for Inpatients (complete in addition to above) Medication sheet sent with patient NA Intravenous medications needed or due during therapy sent with patient NA Drainage tubes (e.g. nasogastric tube or  chest tube secured and vented) NA Endotracheal or Tracheotomy tube secured NA Cuff deflated of air and inflated with saline NA Airway suctioned NA Electronic Signature(s) Signed: 09/19/2020 5:39:21 PM By: Levan Hurst RN, BSN Entered By: Levan Hurst on 09/15/2020 10:52:45

## 2020-09-27 NOTE — Progress Notes (Signed)
Nancy [name], Nancy [name] ([id number]) Visit Report for 09/16/2020 SuperBill Details Patient Name: Date of Service: Nancy [name], Nancy [name] [name] 09/16/2020 Medical Record Number: [id number] Patient Account Number: [id number] Date of Birth/Sex: Treating RN: [birth year] (60 y.o. 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

## 2020-09-27 NOTE — Progress Notes (Signed)
Nancy [name], KLOC ([id number]) Visit Report for 09/13/2020 HBO Details Patient Name: Date of Service: [place], [name] 09/13/2020 8:00 A M Medical Record Number: [id number] Patient Account Number: [id number] Date of Birth/Sex: Treating RN: June 29, 1960 (60 y.o. 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 HBO Treatment Details Treatment Number: 22 Patient Type: Outpatient Chamber Type: Monoplace Chamber Serial #: [id number] Treatment Protocol: 2.5 ATA with 90 minutes oxygen, with two 5 minute air breaks Treatment Details Compression Rate Down: 2.0 psi / minute De-Compression Rate Up: 2.0 psi / minute A breaks and breathing ir Compress Tx Pressure periods Decompress Decompress Begins Reached (leave unused spaces Begins Ends blank) Chamber Pressure (ATA 1 2.5 2.5 2.5 2.5 2.5 - - 2.5 1 ) Clock Time (24 hr) 08:24 08:36 09:06 09:11 09:41 09:46 - - 10:16 10:27 Treatment Length: 123 (minutes) Treatment Segments: 4 Vital Signs Capillary Blood Glucose Reference Range: 80 - 120 mg / dl HBO Diabetic Blood Glucose Intervention Range: <131 mg/dl or >249 mg/dl Time Vitals Blood Respiratory Capillary Blood Glucose Pulse Action Type: Pulse: Temperature: Taken: Pressure: Rate: Glucose (mg/dl): Meter #: Oximetry (%) Taken: Pre 07:55 124/69 66 16 97.8 Post 10:29 148/79 60 16 97.9 Treatment Response Treatment Toleration: Well Treatment Completion Status: Treatment Completed without Adverse Event Electronic Signature(s) Signed: 09/13/2020 6:08:37 PM By: [name] Signed: 09/27/2020 3:23:56 PM By: Kalman Shan DO Entered By: [name] on  09/13/2020 10:40:38 -------------------------------------------------------------------------------- HBO Safety Checklist Details Patient Name: Date of Service: [name], [name] 09/13/2020 8:00 A M Medical Record Number: [id number] Patient Account Number: [id number] Date of Birth/Sex: Treating RN: April 12, 1961 (60 y.o. [name], [name] Primary Care Taishawn Smaldone: [name] Other Clinician: Referring Wenceslaus Gist: Treating Shakima Nisley/Extender: Claris Gladden in Treatment: 4 HBO Safety Checklist Items Safety Checklist Consent Form Signed Patient voided / foley secured and emptied When did you last eato last night Last dose of injectable or oral agent n/a Ostomy pouch emptied and vented if applicable NA All implantable devices assessed, documented and approved NA Intravenous access site secured and place NA Valuables secured Linens and cotton and cotton/polyester blend (less than 51% polyester) Personal oil-based products / skin lotions / body lotions removed NA Wigs or hairpieces removed NA Smoking or tobacco materials removed NA Books / newspapers / magazines / loose paper removed NA Cologne, aftershave, perfume and deodorant removed NA Jewelry removed (may wrap wedding band) Make-up removed NA Hair care products removed NA Battery operated devices (external) removed NA Heating patches and chemical warmers removed NA Titanium eyewear removed Nail polish cured greater than 10 hours NA Casting material cured greater than 10 hours NA Hearing aids removed NA Loose dentures or partials removed NA Prosthetics have been removed NA Patient demonstrates correct use of air break device (if applicable) Patient concerns have been addressed Patient grounding bracelet on and cord attached to chamber Specifics for Inpatients (complete in addition to above) Medication sheet sent with patient Intravenous medications needed or due during therapy sent with  patient Drainage tubes (e.g. nasogastric tube or chest tube secured and vented) Endotracheal or Tracheotomy tube secured Cuff deflated of air and inflated with saline Airway suctioned Electronic Signature(s) Signed: 09/13/2020 6:08:37 PM By: [name] Entered By: [name] on 09/13/2020 09:20:00

## 2020-09-27 NOTE — Progress Notes (Signed)
ZARINA, PE ([id number]) Visit Report for 09/14/2020 SuperBill Details Patient Name: Date of Service: [place], [name] 09/14/2020 Medical Record Number: [id number] Patient Account Number: [id number] Date of Birth/Sex: Treating RN: [birth year] (60 y.o. 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 Facility Procedures CPT4 Code Description Modifier Quantity [id number] G0277-(Facility Use Only) HBOT full body chamber, 8min , 4 Physician Procedures Quantity CPT4 Code Description Modifier [id number] - WC PHYS HYPERBARIC OXYGEN THERAPY 1 ICD-10 Diagnosis Description 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

## 2020-09-27 NOTE — Progress Notes (Signed)
Nancy [name], Nancy [name] ([id number]) Visit Report for 09/08/2020 HBO Details Patient Name: Date of Service: [name], [name] 09/08/2020 8:00 A M Medical Record Number: [id number] Patient Account Number: [id number] Date of Birth/Sex: Treating RN: 1960-09-10 (60 y.o. 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 HBO Treatment Details Treatment Number: 19 Patient Type: Outpatient Chamber Type: Monoplace Chamber Serial #: [id number] Treatment Protocol: 2.5 ATA with 90 minutes oxygen, with two 5 minute air breaks Treatment Details Compression Rate Down: 2.0 psi / minute De-Compression Rate Up: 2.0 psi / minute A breaks and breathing ir Compress Tx Pressure periods Decompress Decompress Begins Reached (leave unused spaces Begins Ends blank) Chamber Pressure (ATA 1 2.5 2.5 2.5 2.5 2.5 - - 2.5 1 ) Clock Time (24 hr) 08:12 08:24 08:54 08:59 09:29 09:34 - - 10:04 10:16 Treatment Length: 124 (minutes) Treatment Segments: 4 Vital Signs Capillary Blood Glucose Reference Range: 80 - 120 mg / dl HBO Diabetic Blood Glucose Intervention Range: <131 mg/dl or >249 mg/dl Time Vitals Blood Respiratory Capillary Blood Glucose Pulse Action Type: Pulse: Temperature: Taken: Pressure: Rate: Glucose (mg/dl): Meter #: Oximetry (%) Taken: Pre 08:00 159/64 68 16 98.1 Post 10:16 132/78 81 18 98.1 Treatment Response Treatment Completion Status: Treatment Completed without Adverse Event Electronic Signature(s) Signed: 09/08/2020 5:27:18 PM By: Levan Hurst RN, BSN Signed: 09/27/2020 3:23:56 PM By: Kalman Shan DO Entered By: Levan Hurst on 09/08/2020  11:55:09 -------------------------------------------------------------------------------- HBO Safety Checklist Details Patient Name: Date of Service: Nancy [name] NA D. 09/08/2020 8:00 A M Medical Record Number: [id number] Patient Account Number: [id number] Date of Birth/Sex: Treating RN: [birth year] (60 y.o. Nancy [name] Primary Care Jshawn Hurta: [name] Other Clinician: Referring Lesly Joslyn: Treating Vinaya Sancho/Extender: Claris Gladden in Treatment: 4 HBO Safety Checklist Items Safety Checklist Consent Form Signed Patient voided / foley secured and emptied When did you last eato 0700 Last dose of injectable or oral agent na Ostomy pouch emptied and vented if applicable NA All implantable devices assessed, documented and approved NA Intravenous access site secured and place NA Valuables secured Linens and cotton and cotton/polyester blend (less than 51% polyester) Personal oil-based products / skin lotions / body lotions removed Wigs or hairpieces removed Smoking or tobacco materials removed Books / newspapers / magazines / loose paper removed Cologne, aftershave, perfume and deodorant removed Jewelry removed (may wrap wedding band) Make-up removed Hair care products removed Battery operated devices (external) removed Heating patches and chemical warmers removed Titanium eyewear removed Nail polish cured greater than 10 hours Casting material cured greater than 10 hours NA Hearing aids removed NA Loose dentures or partials removed NA Prosthetics have been removed NA Patient demonstrates correct use of air break device (if applicable) Patient concerns have been addressed Patient grounding bracelet on and cord attached to chamber Specifics for Inpatients (complete in addition to above) Medication sheet sent with patient NA Intravenous medications needed or due during therapy sent with patient NA Drainage tubes (e.g. nasogastric tube or  chest tube secured and vented) NA Endotracheal or Tracheotomy tube secured NA Cuff deflated of air and inflated with saline NA Airway suctioned NA Electronic Signature(s) Signed: 09/08/2020 5:27:18 PM By: Levan Hurst RN, BSN Entered By: Levan Hurst on 09/08/2020 11:53:44

## 2020-09-27 NOTE — Progress Notes (Signed)
Nancy [name], Nancy [name] ([id number]) Visit Report for 09/16/2020 HBO Details Patient Name: Date of Service: [place], [name] 09/16/2020 8:00 A M Medical Record Number: [id number] Patient Account Number: [id number] Date of Birth/Sex: Treating RN: 05/11/1961 (60 y.o. 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 HBO Treatment Details Treatment Number: 25 Patient Type: Outpatient Chamber Type: Monoplace Chamber Serial #: [id number] Treatment Protocol: 2.5 ATA with 90 minutes oxygen, with two 5 minute air breaks Treatment Details Compression Rate Down: 2.0 psi / minute De-Compression Rate Up: 2.0 psi / minute A breaks and breathing ir Compress Tx Pressure periods Decompress Decompress Begins Reached (leave unused spaces Begins Ends blank) Chamber Pressure (ATA 1 2.5 2.5 2.5 2.5 2.5 - - 2.5 1 ) Clock Time (24 hr) 08:05 08:17 08:47 08:52 09:22 09:27 - - 09:57 10:09 Treatment Length: 124 (minutes) Treatment Segments: 4 Vital Signs Capillary Blood Glucose Reference Range: 80 - 120 mg / dl HBO Diabetic Blood Glucose Intervention Range: <131 mg/dl or >249 mg/dl Time Vitals Blood Respiratory Capillary Blood Glucose Pulse Action Type: Pulse: Temperature: Taken: Pressure: Rate: Glucose (mg/dl): Meter #: Oximetry (%) Taken: Pre 07:56 129/72 79 16 98 Post 10:09 158/75 56 18 98 Treatment Response Treatment Completion Status: Treatment Completed without Adverse Event Electronic Signature(s) Signed: 09/19/2020 5:39:21 PM By: Levan Hurst RN, BSN Signed: 09/27/2020 3:23:56 PM By: Kalman Shan DO Entered By: Levan Hurst on 09/16/2020  11:47:21 -------------------------------------------------------------------------------- HBO Safety Checklist Details Patient Name: Date of Service: [name] 09/16/2020 8:00 A M Medical Record Number: [id number] Patient Account Number: [id number] Date of Birth/Sex: Treating RN: June 08, 1960 (60 y.o. Nancy Fetter Primary Care Ardys Hataway: [name] Other Clinician: Referring Richard Ritchey: Treating Puneet Masoner/Extender: Claris Gladden in Treatment: 5 HBO Safety Checklist Items Safety Checklist Consent Form Signed Patient voided / foley secured and emptied When did you last eato 0715 Last dose of injectable or oral agent na Ostomy pouch emptied and vented if applicable NA All implantable devices assessed, documented and approved NA Intravenous access site secured and place NA Valuables secured Linens and cotton and cotton/polyester blend (less than 51% polyester) Personal oil-based products / skin lotions / body lotions removed Wigs or hairpieces removed Smoking or tobacco materials removed Books / newspapers / magazines / loose paper removed Cologne, aftershave, perfume and deodorant removed Jewelry removed (may wrap wedding band) Make-up removed Hair care products removed Battery operated devices (external) removed Heating patches and chemical warmers removed Titanium eyewear removed Nail polish cured greater than 10 hours Casting material cured greater than 10 hours NA Hearing aids removed NA Loose dentures or partials removed NA Prosthetics have been removed NA Patient demonstrates correct use of air break device (if applicable) Patient concerns have been addressed Patient grounding bracelet on and cord attached to chamber Specifics for Inpatients (complete in addition to above) Medication sheet sent with patient NA Intravenous medications needed or due during therapy sent with patient NA Drainage tubes (e.g. nasogastric tube or  chest tube secured and vented) NA Endotracheal or Tracheotomy tube secured NA Cuff deflated of air and inflated with saline NA Airway suctioned NA Electronic Signature(s) Signed: 09/19/2020 5:39:21 PM By: Levan Hurst RN, BSN Entered By: Levan Hurst on 09/16/2020 11:46:14

## 2020-09-27 NOTE — Progress Notes (Signed)
Nancy [name], Nancy [name] ([id number]) Visit Report for 09/14/2020 HBO Details Patient Name: Date of Service: [name], [name] 09/14/2020 8:00 A M Medical Record Number: [id number] Patient Account Number: [id number] Date of Birth/Sex: Treating RN: August 24, 1960 (60 y.o. 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 HBO Treatment Details Treatment Number: 23 Patient Type: Outpatient Chamber Type: Monoplace Chamber Serial #: [id number] Treatment Protocol: 2.5 ATA with 90 minutes oxygen, with two 5 minute air breaks Treatment Details Compression Rate Down: 2.0 psi / minute De-Compression Rate Up: 2.0 psi / minute A breaks and breathing ir Compress Tx Pressure periods Decompress Decompress Begins Reached (leave unused spaces Begins Ends blank) Chamber Pressure (ATA 1 2.5 2.5 2.5 2.5 2.5 - - 2.5 1 ) Clock Time (24 hr) 08:14 08:25 08:55 09:00 09:30 09:35 - - 10:05 10:16 Treatment Length: 122 (minutes) Treatment Segments: 4 Vital Signs Capillary Blood Glucose Reference Range: 80 - 120 mg / dl HBO Diabetic Blood Glucose Intervention Range: <131 mg/dl or >249 mg/dl Time Vitals Blood Respiratory Capillary Blood Glucose Pulse Action Type: Pulse: Temperature: Taken: Pressure: Rate: Glucose (mg/dl): Meter #: Oximetry (%) Taken: Pre 07:50 138/78 74 18 98.3 Post 10:17 170/81 59 18 98 Treatment Response Treatment Toleration: Well Treatment Completion Status: Treatment Completed without Adverse Event Electronic Signature(s) Signed: 09/14/2020 5:19:23 PM By: [name] Signed: 09/27/2020 3:23:56 PM By: Kalman Shan DO Entered By: [name] on  09/14/2020 10:28:36 -------------------------------------------------------------------------------- HBO Safety Checklist Details Patient Name: Date of Service: Nancy [name], [name] 09/14/2020 8:00 A M Medical Record Number: [id number] Patient Account Number: [id number] Date of Birth/Sex: Treating RN: 1960/06/21 (60 y.o. Nancy [name], [name] Primary Care [name]: [name] Other Clinician: Referring [name]: Treating [name] in Treatment: 4 HBO Safety Checklist Items Safety Checklist Consent Form Signed Patient voided / foley secured and emptied When did you last eato last night Last dose of injectable or oral agent n/a Ostomy pouch emptied and vented if applicable NA All implantable devices assessed, documented and approved NA Intravenous access site secured and place NA Valuables secured Linens and cotton and cotton/polyester blend (less than 51% polyester) Personal oil-based products / skin lotions / body lotions removed NA Wigs or hairpieces removed NA Smoking or tobacco materials removed NA Books / newspapers / magazines / loose paper removed NA Cologne, aftershave, perfume and deodorant removed NA Jewelry removed (may wrap wedding band) Make-up removed NA Hair care products removed NA Battery operated devices (external) removed NA Heating patches and chemical warmers removed NA Titanium eyewear removed Nail polish cured greater than 10 hours NA Casting material cured greater than 10 hours NA Hearing aids removed NA Loose dentures or partials removed NA Prosthetics have been removed NA Patient demonstrates correct use of air break device (if applicable) Patient concerns have been addressed Patient grounding bracelet on and cord attached to chamber Specifics for Inpatients (complete in addition to above) Medication sheet sent with patient Intravenous medications needed or due during therapy sent with  patient Drainage tubes (e.g. nasogastric tube or chest tube secured and vented) Endotracheal or Tracheotomy tube secured Cuff deflated of air and inflated with saline Airway suctioned Electronic Signature(s) Signed: 09/14/2020 5:19:23 PM By: [name] Entered By: [name] on 09/14/2020 08:52:02

## 2020-09-28 ENCOUNTER — Other Ambulatory Visit: Payer: Self-pay

## 2020-09-28 ENCOUNTER — Encounter (HOSPITAL_BASED_OUTPATIENT_CLINIC_OR_DEPARTMENT_OTHER): Payer: Medicare Other | Admitting: Physician Assistant

## 2020-09-28 DIAGNOSIS — M272 Inflammatory conditions of jaws: Secondary | ICD-10-CM | POA: Diagnosis not present

## 2020-09-28 NOTE — Progress Notes (Signed)
Nancy [name] ([id number]) Visit Report for 09/28/2020 Arrival Information Details Patient Name: Date of Service: [name], [name] 09/28/2020 8:00 A M Medical Record Number: [id number] Patient Account Number: [id number] Date of Birth/Sex: Treating RN: June 27, 1960 (60 y.o. [name], [name] Primary Care Ashelynn Marks: [name] Other Clinician: Referring Rakan Soffer: Treating Kaulder Zahner/Extender: [name], [name] in Treatment: 6 Visit Information History Since Last Visit Added or deleted any medications: No Patient Arrived: Ambulatory Any new allergies or adverse reactions: No Arrival Time: 07:53 Had a fall or experienced change in No Accompanied By: self activities of daily living that may affect Transfer Assistance: None risk of falls: Patient Identification Verified: Yes Signs or symptoms of abuse/neglect since last visito No Secondary Verification Process Completed: Yes Hospitalized since last visit: No Patient Requires Transmission-Based Precautions: No Implantable device outside of the clinic excluding No Patient Has Alerts: No cellular tissue based products placed in the center since last visit: Pain Present Now: No Electronic Signature(s) Signed: 09/28/2020 5:30:57 PM By: [name] Entered By: [name] on 09/28/2020 09:48:41 -------------------------------------------------------------------------------- Encounter Discharge Information Details Patient Name: Date of Service: Nancy Field, [name] 09/28/2020 8:00 A M Medical Record Number: [id number] Patient Account Number: [id number] Date of Birth/Sex: Treating RN: February 03, 1961 (60 y.o. [name] Primary Care Nikolaos Maddocks: [name] Other Clinician: Referring Grisel Blumenstock: Treating Marcelline Temkin/Extender: [name], [name] in Treatment: 6 Encounter Discharge Information Items Discharge Condition: Stable Ambulatory Status: Ambulatory Discharge Destination:  Home Transportation: Private Auto Accompanied By: self Schedule Follow-up Appointment: Yes Clinical Summary of Care: Electronic Signature(s) Signed: 09/28/2020 5:30:57 PM By: [name] Entered By: [name] on 09/28/2020 10:38:59 -------------------------------------------------------------------------------- Vitals Details Patient Name: Date of Service: Nancy Field, [name] 09/28/2020 8:00 A M Medical Record Number: [id number] Patient Account Number: [id number] Date of Birth/Sex: Treating RN: 06/18/1960 (60 y.o. [name], [name] Primary Care Yahsir Wickens: [name] Other Clinician: Referring Aron Inge: Treating Montre Harbor/Extender: [name], [name] in Treatment: 6 Vital Signs Time Taken: 07:53 Temperature (F): 98.1 Height (in): 65 Pulse (bpm): 71 Weight (lbs): 175 Respiratory Rate (breaths/min): 18 Body Mass Index (BMI): 29.1 Blood Pressure (mmHg): 134/77 Reference Range: 80 - 120 mg / dl Electronic Signature(s) Signed: 09/28/2020 5:30:57 PM By: [name] Entered By: [name] on 09/28/2020 09:48:51

## 2020-09-28 NOTE — Progress Notes (Signed)
LATONIA, CONROW ([id number]) Visit Report for 09/28/2020 HBO Details Patient Name: Date of Service: [place], [name] 09/28/2020 8:00 A M Medical Record Number: [id number] Patient Account Number: [id number] Date of Birth/Sex: Treating RN: 1960-08-23 (60 y.o. 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 HBO Treatment Details Treatment Number: 32 Patient Type: Outpatient Chamber Type: Monoplace Chamber Serial #: [id number] Treatment Protocol: 2.5 ATA with 90 minutes oxygen, with two 5 minute air breaks Treatment Details A breaks and breathing ir Compress Tx Pressure periods Decompress Decompress Begins Reached (leave unused spaces Begins Ends blank) Chamber Pressure (ATA 1 2.5 2.5 2.5 2.5 2.5 - - 2.5 1 ) Clock Time (24 hr) 08:07 08:21 08:51 08:56 09:26 09:31 - - 10:01 10:11 Treatment Length: 124 (minutes) Treatment Segments: 4 Vital Signs Capillary Blood Glucose Reference Range: 80 - 120 mg / dl HBO Diabetic Blood Glucose Intervention Range: <131 mg/dl or >249 mg/dl Time Vitals Blood Respiratory Capillary Blood Glucose Pulse Action Type: Pulse: Temperature: Taken: Pressure: Rate: Glucose (mg/dl): Meter #: Oximetry (%) Taken: Pre 07:53 134/77 71 18 98.1 Post 10:18 129/77 61 16 97.9 Treatment Response Treatment Toleration: Well Treatment Completion Status: Treatment Completed without Adverse Event Electronic Signature(s) Signed: 09/28/2020 5:30:57 PM By: [name] Signed: 09/28/2020 5:37:47 PM By: Worthy Keeler PA-C Entered By: [name] on 09/28/2020  10:38:34 -------------------------------------------------------------------------------- HBO Safety Checklist Details Patient Name: Date of Service: [name], [name] 09/28/2020 8:00 A M Medical Record Number: [id number] Patient Account Number: [id number] Date of Birth/Sex: Treating RN: 15-Aug-1960 (60 y.o. [name], [name] Primary Care Kayelynn Abdou: [name] Other Clinician: Referring Bethan Adamek: Treating Tayshawn Purnell/Extender: [name], [name] in Treatment: 6 HBO Safety Checklist Items Safety Checklist Consent Form Signed Patient voided / foley secured and emptied When did you last eato last night Last dose of injectable or oral agent n/a Ostomy pouch emptied and vented if applicable NA All implantable devices assessed, documented and approved NA Intravenous access site secured and place NA Valuables secured Linens and cotton and cotton/polyester blend (less than 51% polyester) Personal oil-based products / skin lotions / body lotions removed NA Wigs or hairpieces removed NA Smoking or tobacco materials removed NA Books / newspapers / magazines / loose paper removed NA Cologne, aftershave, perfume and deodorant removed NA Jewelry removed (may wrap wedding band) Make-up removed NA Hair care products removed NA Battery operated devices (external) removed NA Heating patches and chemical warmers removed NA Titanium eyewear removed Nail polish cured greater than 10 hours NA Casting material cured greater than 10 hours NA Hearing aids removed NA Loose dentures or partials removed NA Prosthetics have been removed NA Patient demonstrates correct use of air break device (if applicable) Patient concerns have been addressed Patient grounding bracelet on and cord attached to chamber Specifics for Inpatients (complete in addition to above) Medication sheet sent with patient Intravenous medications needed or due during therapy sent with  patient Drainage tubes (e.g. nasogastric tube or chest tube secured and vented) Endotracheal or Tracheotomy tube secured Cuff deflated of air and inflated with saline Airway suctioned Electronic Signature(s) Signed: 09/28/2020 5:30:57 PM By: [name] Entered By: [name] on 09/28/2020 09:50:45

## 2020-09-28 NOTE — Progress Notes (Signed)
SHABRE, KREHER ([id number]) Visit Report for 09/27/2020 HBO Details Patient Name: Date of Service: [place], [name] 09/27/2020 8:00 A M Medical Record Number: [id number] Patient Account Number: [id number] Date of Birth/Sex: Treating RN: 1960/12/26 (60 y.o. 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 HBO Treatment Details Treatment Number: 31 Patient Type: Outpatient Chamber Type: Monoplace Chamber Serial #: [id number] Treatment Protocol: 2.5 ATA with 90 minutes oxygen, with two 5 minute air breaks Treatment Details Compression Rate Down: 2.0 psi / minute De-Compression Rate Up: 2.0 psi / minute A breaks and breathing ir Compress Tx Pressure periods Decompress Decompress Begins Reached (leave unused spaces Begins Ends blank) Chamber Pressure (ATA 1 2.5 2.5 2.5 2.5 2.5 - - 2.5 1 ) Clock Time (24 hr) 08:02 08:13 08:45 08:50 09:20 09:25 - - 09:55 10:07 Treatment Length: 125 (minutes) Treatment Segments: 4 Vital Signs Capillary Blood Glucose Reference Range: 80 - 120 mg / dl HBO Diabetic Blood Glucose Intervention Range: <131 mg/dl or >249 mg/dl Time Vitals Blood Respiratory Capillary Blood Glucose Pulse Action Type: Pulse: Temperature: Taken: Pressure: Rate: Glucose (mg/dl): Meter #: Oximetry (%) Taken: Pre 07:58 138/87 72 18 97.6 Post 10:07 140/83 61 16 98.3 Treatment Response Treatment Completion Status: Treatment Completed without Adverse Event Stephane Junkins Notes No concerns with treatment given Physician HBO Attestation: I certify that I supervised this HBO treatment in accordance with Medicare guidelines. A trained emergency  response team is readily available per Yes hospital policies and procedures. Continue HBOT as ordered. Yes Electronic Signature(s) Signed: 09/27/2020 4:50:17 PM By: Linton Ham MD Entered By: Linton Ham on 09/27/2020 15:14:53 -------------------------------------------------------------------------------- HBO Safety Checklist Details Patient Name: Date of Service: [name] 09/27/2020 8:00 A M Medical Record Number: [id number] Patient Account Number: [id number] Date of Birth/Sex: Treating RN: 11-12-1960 (60 y.o. Nancy Fetter Primary Care Surie Suchocki: [name] Other Clinician: Referring Aras Albarran: Treating Mathhew Buysse/Extender: Rosana Hoes, [name] in Treatment: 6 HBO Safety Checklist Items Safety Checklist Consent Form Signed Patient voided / foley secured and emptied When did you last eato 0700 Last dose of injectable or oral agent na Ostomy pouch emptied and vented if applicable NA All implantable devices assessed, documented and approved NA Intravenous access site secured and place NA Valuables secured Linens and cotton and cotton/polyester blend (less than 51% polyester) Personal oil-based products / skin lotions / body lotions removed Wigs or hairpieces removed Smoking or tobacco materials removed Books / newspapers / magazines / loose paper removed Cologne, aftershave, perfume and deodorant removed Jewelry removed (may wrap wedding band) Make-up removed Hair care products removed Battery operated devices (external) removed Heating patches and chemical warmers removed Titanium eyewear removed Nail polish cured greater than 10 hours Casting material cured greater than 10 hours NA Hearing aids removed NA Loose dentures or partials removed NA Prosthetics have been removed NA Patient demonstrates correct use of air break device (if applicable) Patient concerns have been addressed Patient grounding bracelet on and cord  attached to chamber Specifics for Inpatients (complete in addition to above) Medication sheet sent with patient NA Intravenous medications needed or due during therapy sent with patient NA Drainage tubes (e.g. nasogastric tube or chest tube secured and vented) NA Endotracheal or Tracheotomy tube secured NA Cuff deflated of  air and inflated with saline NA Airway suctioned NA Electronic Signature(s) Signed: 09/28/2020 5:58:33 PM By: Levan Hurst RN, BSN Entered By: Levan Hurst on 09/27/2020 09:04:34

## 2020-09-28 NOTE — Progress Notes (Signed)
BRIYAH, WHEELWRIGHT ([id number]) Visit Report for 09/27/2020 SuperBill Details Patient Name: Date of Service: [place], [name] 09/27/2020 Medical Record Number: [id number] Patient Account Number: [id number] Date of Birth/Sex: Treating RN: 1960-06-17 (60 y.o. 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

## 2020-09-28 NOTE — Progress Notes (Signed)
Nancy [name], Nancy [name] ([id number]) Visit Report for 09/28/2020 SuperBill Details Patient Name: Date of Service: [name], [name] 09/28/2020 Medical Record Number: [id number] Patient Account Number: [id number] Date of Birth/Sex: Treating RN: Dec 26, 1960 (60 y.o. 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 Facility Procedures CPT4 Code Description Modifier Quantity 03009233 G0277-(Facility Use Only) HBOT full body chamber, 45min , 4 Physician Procedures Quantity CPT4 Code Description Modifier 0076226 33354 - WC PHYS HYPERBARIC OXYGEN THERAPY 1 ICD-10 Diagnosis Description Z51.0 Encounter for antineoplastic radiation therapy L59.8 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

## 2020-09-28 NOTE — Progress Notes (Signed)
Nancy [name] ([id number]) Visit Report for 09/27/2020 Arrival Information Details Patient Name: Date of Service: [place], [name] Nancy D. 09/27/2020 8:00 A M Medical Record Number: [id number] Patient Account Number: [id number] Date of Birth/Sex: Treating RN: 1960/09/03 (60 y.o. Nancy [name] Primary Care Nancy [name]: Nancy [name] Other Clinician: Referring Nancy [name]: Treating Nancy [name]/Extender: Nancy [name], Nancy [name] in Treatment: 6 Visit Information History Since Last Visit Added or deleted any medications: No Patient Arrived: Ambulatory Any new allergies or adverse reactions: No Arrival Time: 07:58 Had a fall or experienced change in No Accompanied By: alone activities of daily living that may affect Transfer Assistance: None risk of falls: Patient Identification Verified: Yes Signs or symptoms of abuse/neglect since last visito No Secondary Verification Process Completed: Yes Hospitalized since last visit: No Patient Requires Transmission-Based Precautions: No Implantable device outside of the clinic excluding No Patient Has Alerts: No cellular tissue based products placed in the center since last visit: Pain Present Now: No Electronic Signature(s) Signed: 09/28/2020 5:58:33 PM By: Nancy Hurst RN, BSN Entered By: Nancy [name] on 09/27/2020 09:03:33 -------------------------------------------------------------------------------- Encounter Discharge Information Details Patient Name: Date of Service: Nancy [name] NA D. 09/27/2020 8:00 A M Medical Record Number: [id number] Patient Account Number: [id number] Date of Birth/Sex: Treating RN: August 19, 1960 (60 y.o. Nancy [name] Primary Care Mattye Verdone: Nancy [name] Other Clinician: Referring Nancy [name]: Treating Nancy [name]/Extender: Nancy [name] in Treatment: 6 Encounter Discharge Information Items Discharge Condition: Stable Ambulatory Status: Ambulatory Discharge Destination:  Home Transportation: Private Auto Accompanied By: alone Schedule Follow-up Appointment: Yes Clinical Summary of Care: Patient Declined Electronic Signature(s) Signed: 09/28/2020 5:58:33 PM By: Nancy Hurst RN, BSN Entered By: Nancy [name] on 09/27/2020 11:14:34 -------------------------------------------------------------------------------- Vitals Details Patient Name: Date of Service: Nancy [name], [name] 09/27/2020 8:00 A M Medical Record Number: [id number] Patient Account Number: [id number] Date of Birth/Sex: Treating RN: 23-Mar-1961 (60 y.o. Nancy [name] Primary Care Nancy [name]: Nancy [name] Other Clinician: Referring Nancy [name]: Treating Nancy [name]/Extender: Nancy [name], Nancy [name] in Treatment: 6 Vital Signs Time Taken: 07:58 Temperature (F): 97.6 Height (in): 65 Pulse (bpm): 72 Weight (lbs): 175 Respiratory Rate (breaths/min): 18 Body Mass Index (BMI): 29.1 Blood Pressure (mmHg): 138/87 Reference Range: 80 - 120 mg / dl Electronic Signature(s) Signed: 09/28/2020 5:58:33 PM By: Nancy Hurst RN, BSN Entered By: Nancy [name] on 09/27/2020 09:03:53

## 2020-09-29 ENCOUNTER — Encounter (HOSPITAL_BASED_OUTPATIENT_CLINIC_OR_DEPARTMENT_OTHER): Payer: Medicare Other | Admitting: Internal Medicine

## 2020-09-29 DIAGNOSIS — M272 Inflammatory conditions of jaws: Secondary | ICD-10-CM | POA: Diagnosis not present

## 2020-09-30 ENCOUNTER — Encounter (HOSPITAL_BASED_OUTPATIENT_CLINIC_OR_DEPARTMENT_OTHER): Payer: Medicare Other | Admitting: Internal Medicine

## 2020-09-30 ENCOUNTER — Other Ambulatory Visit: Payer: Self-pay

## 2020-09-30 DIAGNOSIS — M272 Inflammatory conditions of jaws: Secondary | ICD-10-CM | POA: Diagnosis not present

## 2020-10-03 ENCOUNTER — Other Ambulatory Visit: Payer: Self-pay

## 2020-10-03 ENCOUNTER — Encounter (HOSPITAL_BASED_OUTPATIENT_CLINIC_OR_DEPARTMENT_OTHER): Payer: Medicare Other | Admitting: Internal Medicine

## 2020-10-03 DIAGNOSIS — M272 Inflammatory conditions of jaws: Secondary | ICD-10-CM | POA: Diagnosis not present

## 2020-10-04 ENCOUNTER — Other Ambulatory Visit: Payer: Self-pay

## 2020-10-04 ENCOUNTER — Encounter (HOSPITAL_BASED_OUTPATIENT_CLINIC_OR_DEPARTMENT_OTHER): Payer: Medicare Other | Admitting: Internal Medicine

## 2020-10-04 DIAGNOSIS — M272 Inflammatory conditions of jaws: Secondary | ICD-10-CM | POA: Diagnosis not present

## 2020-10-05 ENCOUNTER — Other Ambulatory Visit: Payer: Self-pay

## 2020-10-05 ENCOUNTER — Encounter (HOSPITAL_BASED_OUTPATIENT_CLINIC_OR_DEPARTMENT_OTHER): Payer: Medicare Other | Admitting: Physician Assistant

## 2020-10-05 DIAGNOSIS — M272 Inflammatory conditions of jaws: Secondary | ICD-10-CM | POA: Diagnosis not present

## 2020-10-05 NOTE — Progress Notes (Signed)
Nancy [name] ([id number]) Visit Report for 09/30/2020 HBO Details Patient Name: Date of Service: [place], [name] 09/30/2020 8:00 A M Medical Record Number: [id number] Patient Account Number: [id number] Date of Birth/Sex: Treating RN: Jan 11, 1961 (60 y.o. 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 HBO Treatment Details Treatment Number: 34 Patient Type: Outpatient Chamber Type: Monoplace Chamber Serial #: [id number] Treatment Protocol: 2.5 ATA with 90 minutes oxygen, with two 5 minute air breaks Treatment Details Compression Rate Down: 2.0 psi / minute De-Compression Rate Up: 2.0 psi / minute A breaks and breathing ir Compress Tx Pressure periods Decompress Decompress Begins Reached (leave unused spaces Begins Ends blank) Chamber Pressure (ATA 1 2.5 2.5 2.5 2.5 2.5 - - 2.5 1 ) Clock Time (24 hr) 08:01 08:13 08:43 08:48 09:18 09:23 - - 09:53 10:05 Treatment Length: 124 (minutes) Treatment Segments: 4 Vital Signs Capillary Blood Glucose Reference Range: 80 - 120 mg / dl HBO Diabetic Blood Glucose Intervention Range: <131 mg/dl or >249 mg/dl Time Vitals Blood Respiratory Capillary Blood Glucose Pulse Action Type: Pulse: Temperature: Taken: Pressure: Rate: Glucose (mg/dl): Meter #: Oximetry (%) Taken: Pre 07:55 123/79 72 16 97.9 Post 10:05 138/83 60 16 98 Treatment Response Treatment Completion Status: Treatment Completed without Adverse Event [name] Notes No concerns with treatment given Physician HBO Attestation: I certify that I supervised this HBO treatment in accordance with Medicare guidelines. A trained emergency  response team is readily available per Yes hospital policies and procedures. Continue HBOT as ordered. Yes Electronic Signature(s) Signed: 09/30/2020 5:00:10 PM By: Linton Ham MD Entered By: Linton Ham on 09/30/2020 15:56:38 -------------------------------------------------------------------------------- HBO Safety Checklist Details Patient Name: Date of Service: Nancy [name] NA D. 09/30/2020 8:00 A M Medical Record Number: [id number] Patient Account Number: [id number] Date of Birth/Sex: Treating RN: 02-07-1961 (60 y.o. Nancy [name] Primary Care Maximilian Tallo: [name] Other Clinician: Referring Rudolpho Claxton: Treating Sebastian Dzik/Extender: Rosana Hoes, [name] in Treatment: 7 HBO Safety Checklist Items Safety Checklist Consent Form Signed Patient voided / foley secured and emptied When did you last eato 0700 Last dose of injectable or oral agent na Ostomy pouch emptied and vented if applicable NA All implantable devices assessed, documented and approved NA Intravenous access site secured and place NA Valuables secured Linens and cotton and cotton/polyester blend (less than 51% polyester) Personal oil-based products / skin lotions / body lotions removed Wigs or hairpieces removed Smoking or tobacco materials removed Books / newspapers / magazines / loose paper removed Cologne, aftershave, perfume and deodorant removed Jewelry removed (may wrap wedding band) Make-up removed Hair care products removed Battery operated devices (external) removed Heating patches and chemical warmers removed Titanium eyewear removed Nail polish cured greater than 10 hours Casting material cured greater than 10 hours NA Hearing aids removed NA Loose dentures or partials removed NA Prosthetics have been removed Patient demonstrates correct use of air break device (if applicable) Patient concerns have been addressed Patient grounding bracelet on and cord attached  to chamber Specifics for Inpatients (complete in addition to above) Medication sheet sent with patient NA Intravenous medications needed or due during therapy sent with patient NA Drainage tubes (e.g. nasogastric tube or chest tube secured and vented) NA Endotracheal or Tracheotomy tube secured NA Cuff deflated of air  and inflated with saline NA Airway suctioned NA Electronic Signature(s) Signed: 10/05/2020 6:30:22 PM By: Levan Hurst RN, BSN Entered By: Levan Hurst on 09/30/2020 10:53:01

## 2020-10-05 NOTE — Progress Notes (Signed)
[name], Nancy [name] ([id number]) Visit Report for 09/29/2020 SuperBill Details Patient Name: Date of Service: [name], [name] 09/29/2020 Medical Record Number: [id number] Patient Account Number: [id number] Date of Birth/Sex: Treating RN: 1960/08/17 (60 y.o. 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 Encounter for antineoplastic radiation therapy L59.8 Other specified disorders of the skin and subcutaneous tissue related to radiation Facility Procedures CPT4 Code Description Modifier Quantity [id number] G0277-(Facility Use Only) HBOT full body chamber, 58min , 4 Physician Procedures Quantity CPT4 Code Description Modifier [id number] 03559 - WC PHYS HYPERBARIC OXYGEN THERAPY 1 ICD-10 Diagnosis Description 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

## 2020-10-05 NOTE — Progress Notes (Signed)
FIONA, COTO ([id number]) Visit Report for 10/03/2020 SuperBill Details Patient Name: Date of Service: [place], [name] 10/03/2020 Medical Record Number: [id number] Patient Account Number: [id number] Date of Birth/Sex: Treating RN: 10/18/1960 (60 y.o. 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 Encounter for antineoplastic radiation therapy L59.8 Other specified disorders of the skin and subcutaneous tissue related to radiation Facility Procedures CPT4 Code Description Modifier Quantity [id number] G0277-(Facility Use Only) HBOT full body chamber, 58min , 4 Physician Procedures Quantity CPT4 Code Description Modifier 7353299 24268 - WC PHYS HYPERBARIC OXYGEN THERAPY 1 ICD-10 Diagnosis Description 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

## 2020-10-05 NOTE — Progress Notes (Signed)
CARTINA, BROUSSEAU ([id number]) Visit Report for 10/04/2020 SuperBill Details Patient Name: Date of Service: [place], [name] 10/04/2020 Medical Record Number: [id number] Patient Account Number: [id number] Date of Birth/Sex: Treating RN: 03-08-1961 (60 y.o. 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

## 2020-10-05 NOTE — Progress Notes (Signed)
RAMAH, LANGHANS ([id number]) Visit Report for 09/29/2020 Arrival Information Details Patient Name: Date of Service: [place], [name] 09/29/2020 8:00 A M Medical Record Number: [id number] Patient Account Number: [id number] Date of Birth/Sex: Treating RN: 1961-03-05 (60 y.o. Nancy Fetter Primary Care Shem Plemmons: [name] Other Clinician: Referring Edona Schreffler: Treating Marvia Troost/Extender: Rosana Hoes, [name] in Treatment: 7 Visit Information History Since Last Visit Added or deleted any medications: No Patient Arrived: Ambulatory Any new allergies or adverse reactions: No Arrival Time: 08:28 Had a fall or experienced change in No Accompanied By: alone activities of daily living that may affect Transfer Assistance: None risk of falls: Patient Identification Verified: Yes Signs or symptoms of abuse/neglect since last visito No Secondary Verification Process Completed: Yes Hospitalized since last visit: No Patient Requires Transmission-Based Precautions: No Implantable device outside of the clinic excluding No Patient Has Alerts: No cellular tissue based products placed in the center since last visit: Pain Present Now: No Electronic Signature(s) Signed: 10/05/2020 6:30:22 PM By: Levan Hurst RN, BSN Entered By: Levan Hurst on 09/29/2020 08:28:50 -------------------------------------------------------------------------------- Encounter Discharge Information Details Patient Name: Date of Service: [name] 09/29/2020 8:00 A M Medical Record Number: [id number] Patient Account Number: [id number] Date of Birth/Sex: Treating RN: 04-Feb-1961 (60 y.o. Nancy Fetter Primary Care Vina Byrd: [name] Other Clinician: Referring Anothy Bufano: Treating Remington Highbaugh/Extender: Cleophas Dunker in Treatment: 7 Encounter Discharge Information Items Discharge Condition: Stable Ambulatory Status: Ambulatory Discharge Destination:  Home Transportation: Private Auto Accompanied By: alone Schedule Follow-up Appointment: Yes Clinical Summary of Care: Patient Declined Electronic Signature(s) Signed: 10/05/2020 6:30:22 PM By: Levan Hurst RN, BSN Entered By: Levan Hurst on 09/29/2020 14:16:32 -------------------------------------------------------------------------------- [place] Details Patient Name: Date of Service: [name], [name] 09/29/2020 8:00 A M Medical Record Number: [id number] Patient Account Number: [id number] Date of Birth/Sex: Treating RN: [birth year] (60 y.o. Nancy Fetter Primary Care Allye Hoyos: [name] Other Clinician: Referring Areona Homer: Treating Mychael Smock/Extender: Rosana Hoes, [name] in Treatment: 7 Vital Signs Time Taken: 08:12 Temperature (F): 98.2 Height (in): 65 Pulse (bpm): 65 Weight (lbs): 175 Respiratory Rate (breaths/min): 18 Body Mass Index (BMI): 29.1 Blood Pressure (mmHg): 111/77 Reference Range: 80 - 120 mg / dl Electronic Signature(s) Signed: 10/05/2020 6:30:22 PM By: Levan Hurst RN, BSN Entered By: Levan Hurst on 09/29/2020 08:29:09

## 2020-10-05 NOTE — Progress Notes (Signed)
ROLAND, PRINE ([id number]) Visit Report for 09/30/2020 Arrival Information Details Patient Name: Date of Service: [place], [name] 09/30/2020 8:00 A M Medical Record Number: [id number] Patient Account Number: [id number] Date of Birth/Sex: Treating RN: 05-12-1961 (60 y.o. Nancy Fetter Primary Care Sherrick Araki: [name] Other Clinician: Referring Aashvi Rezabek: Treating Kaynen Minner/Extender: Rosana Hoes, [name] in Treatment: 7 Visit Information History Since Last Visit Added or deleted any medications: No Patient Arrived: Ambulatory Any new allergies or adverse reactions: No Arrival Time: 07:55 Had a fall or experienced change in No Accompanied By: alone activities of daily living that may affect Transfer Assistance: None risk of falls: Patient Identification Verified: Yes Signs or symptoms of abuse/neglect since last visito No Secondary Verification Process Completed: Yes Hospitalized since last visit: No Patient Requires Transmission-Based Precautions: No Implantable device outside of the clinic excluding No Patient Has Alerts: No cellular tissue based products placed in the center since last visit: Pain Present Now: No Electronic Signature(s) Signed: 10/05/2020 6:30:22 PM By: Levan Hurst RN, BSN Entered By: Levan Hurst on 09/30/2020 10:52:04 -------------------------------------------------------------------------------- Encounter Discharge Information Details Patient Name: Date of Service: [name] 09/30/2020 8:00 A M Medical Record Number: [id number] Patient Account Number: [id number] Date of Birth/Sex: Treating RN: [birth year] (60 y.o. Nancy Fetter Primary Care Lamont Tant: [name] Other Clinician: Referring Jomaira Darr: Treating Julya Alioto/Extender: Cleophas Dunker in Treatment: 7 Encounter Discharge Information Items Discharge Condition: Stable Ambulatory Status: Ambulatory Discharge Destination:  Home Transportation: Private Auto Accompanied By: alone Schedule Follow-up Appointment: Yes Clinical Summary of Care: Patient Declined Electronic Signature(s) Signed: 10/05/2020 6:30:22 PM By: Levan Hurst RN, BSN Entered By: Levan Hurst on 09/30/2020 10:54:21 -------------------------------------------------------------------------------- Vitals Details Patient Name: Date of Service: [name], [name] 09/30/2020 8:00 A M Medical Record Number: [id number] Patient Account Number: [id number] Date of Birth/Sex: Treating RN: [birth year] (60 y.o. Nancy Fetter Primary Care Tawfiq Favila: [name] Other Clinician: Referring Haddon Fyfe: Treating Shikha Bibb/Extender: Rosana Hoes, [name] in Treatment: 7 Vital Signs Time Taken: 07:55 Temperature (F): 97.9 Height (in): 65 Pulse (bpm): 72 Weight (lbs): 175 Respiratory Rate (breaths/min): 16 Body Mass Index (BMI): 29.1 Blood Pressure (mmHg): 123/79 Reference Range: 80 - 120 mg / dl Electronic Signature(s) Signed: 10/05/2020 6:30:22 PM By: Levan Hurst RN, BSN Entered By: Levan Hurst on 09/30/2020 10:52:24

## 2020-10-05 NOTE — Progress Notes (Signed)
AYSE, MCCARTIN ([id number]) Visit Report for 10/03/2020 Arrival Information Details Patient Name: Date of Service: [place], [name] 10/03/2020 8:00 A M Medical Record Number: [id number] Patient Account Number: [id number] Date of Birth/Sex: Treating RN: December 30, 1960 (60 y.o. Nancy Fetter Primary Care Jelesa Mangini: [name] Other Clinician: Referring Burdett Pinzon: Treating Athenia Rys/Extender: Rosana Hoes, [name] in Treatment: 7 Visit Information History Since Last Visit Added or deleted any medications: No Patient Arrived: Ambulatory Any new allergies or adverse reactions: No Arrival Time: 07:56 Had a fall or experienced change in No Accompanied By: alone activities of daily living that may affect Transfer Assistance: None risk of falls: Patient Identification Verified: Yes Signs or symptoms of abuse/neglect since last visito No Secondary Verification Process Completed: Yes Hospitalized since last visit: No Patient Requires Transmission-Based Precautions: No Implantable device outside of the clinic excluding No Patient Has Alerts: No cellular tissue based products placed in the center since last visit: Pain Present Now: No Electronic Signature(s) Signed: 10/05/2020 6:30:22 PM By: Levan Hurst RN, BSN Entered By: Levan Hurst on 10/03/2020 08:19:30 -------------------------------------------------------------------------------- Encounter Discharge Information Details Patient Name: Date of Service: [name] 10/03/2020 8:00 A M Medical Record Number: [id number] Patient Account Number: [id number] Date of Birth/Sex: Treating RN: 1960/10/25 (60 y.o. Nancy Fetter Primary Care Nakhi Choi: [name] Other Clinician: Referring Reighlyn Elmes: Treating Khaylee Mcevoy/Extender: Cleophas Dunker in Treatment: 7 Encounter Discharge Information Items Discharge Condition: Stable Ambulatory Status: Ambulatory Discharge Destination:  Home Transportation: Private Auto Accompanied By: alone Schedule Follow-up Appointment: Yes Clinical Summary of Care: Patient Declined Electronic Signature(s) Signed: 10/05/2020 6:30:22 PM By: Levan Hurst RN, BSN Entered By: Levan Hurst on 10/03/2020 12:52:07 -------------------------------------------------------------------------------- Vitals Details Patient Name: Date of Service: [name], [name] 10/03/2020 8:00 A M Medical Record Number: [id number] Patient Account Number: [id number] Date of Birth/Sex: Treating RN: [birth year] (60 y.o. Nancy Fetter Primary Care Ahmar Pickrell: [name] Other Clinician: Referring Peace Noyes: Treating Alexandria Shiflett/Extender: Rosana Hoes, [name] in Treatment: 7 Vital Signs Time Taken: 07:56 Temperature (F): 98.1 Height (in): 65 Pulse (bpm): 69 Weight (lbs): 175 Respiratory Rate (breaths/min): 16 Body Mass Index (BMI): 29.1 Blood Pressure (mmHg): 132/83 Reference Range: 80 - 120 mg / dl Electronic Signature(s) Signed: 10/05/2020 6:30:22 PM By: Levan Hurst RN, BSN Entered By: Levan Hurst on 10/03/2020 08:19:56

## 2020-10-05 NOTE — Progress Notes (Signed)
Nancy [name], Nancy [name] ([id number]) Visit Report for 09/29/2020 HBO Details Patient Name: Date of Service: [name], [name] 09/29/2020 8:00 A M Medical Record Number: [id number] Patient Account Number: [id number] Date of Birth/Sex: Treating RN: August 17, 1960 (60 y.o. 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 HBO Treatment Details Treatment Number: 33 Patient Type: Outpatient Chamber Type: Monoplace Chamber Serial #: [id number] Treatment Protocol: 2.5 ATA with 90 minutes oxygen, with two 5 minute air breaks Treatment Details Compression Rate Down: 2.0 psi / minute De-Compression Rate Up: 2.0 psi / minute A breaks and breathing ir Compress Tx Pressure periods Decompress Decompress Begins Reached (leave unused spaces Begins Ends blank) Chamber Pressure (ATA 1 2.5 2.5 2.5 2.5 2.5 - - 2.5 1 ) Clock Time (24 hr) 08:15 08:27 08:57 09:02 09:32 09:37 - - 10:07 10:19 Treatment Length: 124 (minutes) Treatment Segments: 4 Vital Signs Capillary Blood Glucose Reference Range: 80 - 120 mg / dl HBO Diabetic Blood Glucose Intervention Range: <131 mg/dl or >249 mg/dl Time Vitals Blood Respiratory Capillary Blood Glucose Pulse Action Type: Pulse: Temperature: Taken: Pressure: Rate: Glucose (mg/dl): Meter #: Oximetry (%) Taken: Pre 08:12 111/77 65 18 98.2 Post 10:19 125/75 61 18 98.1 Treatment Response Treatment Completion Status: Treatment Completed without Adverse Event Nancy [name] Notes No concerns with treatment given Physician HBO Attestation: I certify that I supervised this HBO treatment in accordance with Medicare guidelines. A trained emergency  response team is readily available per Yes hospital policies and procedures. Continue HBOT as ordered. Yes Electronic Signature(s) Signed: 09/29/2020 3:44:17 PM By: Nancy Ham MD Entered By: Nancy [name] on 09/29/2020 14:59:38 -------------------------------------------------------------------------------- HBO Safety Checklist Details Patient Name: Date of Service: Nancy [name] NA D. 09/29/2020 8:00 A M Medical Record Number: [id number] Patient Account Number: [id number] Date of Birth/Sex: Treating RN: 12/17/1960 (60 y.o. Nancy [name] Primary Care Nancy [name]: Nancy [name] Other Clinician: Referring Nancy [name]: Treating Nancy [name]/Extender: Nancy [name], Nancy [name] in Treatment: 7 HBO Safety Checklist Items Safety Checklist Consent Form Signed Patient voided / foley secured and emptied When did you last eato 0700 Last dose of injectable or oral agent na Ostomy pouch emptied and vented if applicable NA All implantable devices assessed, documented and approved NA Intravenous access site secured and place NA Valuables secured Linens and cotton and cotton/polyester blend (less than 51% polyester) Personal oil-based products / skin lotions / body lotions removed Wigs or hairpieces removed Smoking or tobacco materials removed Books / newspapers / magazines / loose paper removed Cologne, aftershave, perfume and deodorant removed Jewelry removed (may wrap wedding band) Make-up removed Hair care products removed Battery operated devices (external) removed Heating patches and chemical warmers removed Titanium eyewear removed Nail polish cured greater than 10 hours Casting material cured greater than 10 hours NA Hearing aids removed NA Loose dentures or partials removed NA Prosthetics have been removed NA Patient demonstrates correct use of air break device (if applicable) Patient concerns have been addressed Patient grounding bracelet on and cord  attached to chamber Specifics for Inpatients (complete in addition to above) Medication sheet sent with patient NA Intravenous medications needed or due during therapy sent with patient NA Drainage tubes (e.g. nasogastric tube or chest tube secured and vented) NA Endotracheal or Tracheotomy tube secured NA Cuff deflated of  air and inflated with saline NA Airway suctioned NA Electronic Signature(s) Signed: 10/05/2020 6:30:22 PM By: Levan Hurst RN, BSN Entered By: Levan Hurst on 09/29/2020 08:29:54

## 2020-10-05 NOTE — Progress Notes (Signed)
Nancy [name], Nancy [name] ([id number]) Visit Report for 09/30/2020 SuperBill Details Patient Name: Date of Service: [place], [name] 09/30/2020 Medical Record Number: [id number] Patient Account Number: [id number] Date of Birth/Sex: Treating RN: 01-May-1961 (60 y.o. 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 Encounter for antineoplastic radiation therapy L59.8 Other specified disorders of the skin and subcutaneous tissue related to radiation Facility Procedures CPT4 Code Description Modifier Quantity [id number] G0277-(Facility Use Only) HBOT full body chamber, 16min , 4 Physician Procedures Quantity CPT4 Code Description Modifier [id number] 28003 - WC PHYS HYPERBARIC OXYGEN THERAPY 1 ICD-10 Diagnosis Description 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

## 2020-10-05 NOTE — Progress Notes (Signed)
FAYETTE, GASNER ([id number]) Visit Report for 10/04/2020 Arrival Information Details Patient Name: Date of Service: [place], [name] 10/04/2020 8:00 A M Medical Record Number: [id number] Patient Account Number: [id number] Date of Birth/Sex: Treating RN: Feb 23, 1961 (60 y.o. Nancy Fetter Primary Care Shriyans Kuenzi: [name] Other Clinician: Referring Meggie Laseter: Treating Ol[place]won Fosdick/Extender: Rosana Hoes, [name] in Treatment: 7 Visit Information History Since Last Visit Added or deleted any medications: No Patient Arrived: Ambulatory Any new allergies or adverse reactions: No Arrival Time: 07:49 Had a fall or experienced change in No Accompanied By: alone activities of daily living that may affect Transfer Assistance: None risk of falls: Patient Identification Verified: Yes Signs or symptoms of abuse/neglect since last visito No Secondary Verification Process Completed: Yes Hospitalized since last visit: No Patient Requires Transmission-Based Precautions: No Implantable device outside of the clinic excluding No Patient Has Alerts: No cellular tissue based products placed in the center since last visit: Pain Present Now: No Electronic Signature(s) Signed: 10/05/2020 6:30:22 PM By: Levan Hurst RN, BSN Entered By: Levan Hurst on 10/04/2020 09:53:07 -------------------------------------------------------------------------------- Encounter Discharge Information Details Patient Name: Date of Service: [name] 10/04/2020 8:00 A M Medical Record Number: [id number] Patient Account Number: [id number] Date of Birth/Sex: Treating RN: 1961/05/15 (60 y.o. Nancy Fetter Primary Care Austin Pongratz: [name] Other Clinician: Referring Victory Strollo: Treating Floye Fesler/Extender: Cleophas Dunker in Treatment: 7 Encounter Discharge Information Items Discharge Condition: Stable Ambulatory Status: Ambulatory Discharge Destination:  Home Transportation: Private Auto Accompanied By: alone Schedule Follow-up Appointment: Yes Clinical Summary of Care: Patient Declined Electronic Signature(s) Signed: 10/05/2020 6:30:22 PM By: Levan Hurst RN, BSN Entered By: Levan Hurst on 10/04/2020 10:41:03 -------------------------------------------------------------------------------- Vitals Details Patient Name: Date of Service: [name], [name] 10/04/2020 8:00 A M Medical Record Number: [id number] Patient Account Number: [id number] Date of Birth/Sex: Treating RN: 1960/08/06 (60 y.o. Nancy Fetter Primary Care Esty Ahuja: [name] Other Clinician: Referring Shawonda Kerce: Treating Charissa Knowles/Extender: Rosana Hoes, [name] in Treatment: 7 Vital Signs Time Taken: 07:49 Temperature (F): 97.8 Height (in): 65 Pulse (bpm): 67 Weight (lbs): 175 Respiratory Rate (breaths/min): 18 Body Mass Index (BMI): 29.1 Blood Pressure (mmHg): 140/80 Reference Range: 80 - 120 mg / dl Electronic Signature(s) Signed: 10/05/2020 6:30:22 PM By: Levan Hurst RN, BSN Entered By: Levan Hurst on 10/04/2020 09:53:28

## 2020-10-05 NOTE — Progress Notes (Signed)
Nancy [name], Nancy [name] ([id number]) Visit Report for 10/03/2020 HBO Details Patient Name: Date of Service: [place], [name] 10/03/2020 8:00 A M Medical Record Number: [id number] Patient Account Number: [id number] Date of Birth/Sex: Treating RN: Mar 12, 1961 (60 y.o. 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 HBO Treatment Details Treatment Number: 35 Patient Type: Outpatient Chamber Type: Monoplace Chamber Serial #: [id number] Treatment Protocol: 2.5 ATA with 90 minutes oxygen, with two 5 minute air breaks Treatment Details Compression Rate Down: 2.0 psi / minute De-Compression Rate Up: 2.0 psi / minute A breaks and breathing ir Compress Tx Pressure periods Decompress Decompress Begins Reached (leave unused spaces Begins Ends blank) Chamber Pressure (ATA 1 2.5 2.5 2.5 2.5 2.5 - - 2.5 1 ) Clock Time (24 hr) 08:05 08:17 08:47 08:52 09:22 09:27 - - 09:57 10:09 Treatment Length: 124 (minutes) Treatment Segments: 4 Vital Signs Capillary Blood Glucose Reference Range: 80 - 120 mg / dl HBO Diabetic Blood Glucose Intervention Range: <131 mg/dl or >249 mg/dl Time Vitals Blood Respiratory Capillary Blood Glucose Pulse Action Type: Pulse: Temperature: Taken: Pressure: Rate: Glucose (mg/dl): Meter #: Oximetry (%) Taken: Pre 07:56 132/83 69 16 98.1 Post 10:09 140/87 63 18 98 Treatment Response Treatment Completion Status: Treatment Completed without Adverse Event Nancy [name] Notes No concerns with treatment given Physician HBO Attestation: I certify that I supervised this HBO treatment in accordance with Medicare guidelines. A trained emergency  response team is readily available per Yes hospital policies and procedures. Continue HBOT as ordered. Yes Electronic Signature(s) Signed: 10/04/2020 4:16:43 PM By: Nancy Ham MD Entered By: Nancy [name] on 10/03/2020 16:22:57 -------------------------------------------------------------------------------- HBO Safety Checklist Details Patient Name: Date of Service: Nancy [name] NA D. 10/03/2020 8:00 A M Medical Record Number: [id number] Patient Account Number: [id number] Date of Birth/Sex: Treating RN: November 20, 1960 (60 y.o. Nancy [name] Primary Care Nancy [name]: Nancy [name] Other Clinician: Referring Nancy [name]: Treating Nancy [name]/Extender: Nancy [name], Nancy [name] in Treatment: 7 HBO Safety Checklist Items Safety Checklist Consent Form Signed Patient voided / foley secured and emptied When did you last eato 0700 Last dose of injectable or oral agent na Ostomy pouch emptied and vented if applicable NA All implantable devices assessed, documented and approved NA Intravenous access site secured and place NA Valuables secured Linens and cotton and cotton/polyester blend (less than 51% polyester) Personal oil-based products / skin lotions / body lotions removed Wigs or hairpieces removed Smoking or tobacco materials removed Books / newspapers / magazines / loose paper removed Cologne, aftershave, perfume and deodorant removed Jewelry removed (may wrap wedding band) Make-up removed Hair care products removed Battery operated devices (external) removed Heating patches and chemical warmers removed Titanium eyewear removed Nail polish cured greater than 10 hours Casting material cured greater than 10 hours NA Hearing aids removed NA Loose dentures or partials removed NA Prosthetics have been removed NA Patient demonstrates correct use of air break device (if applicable) Patient concerns have been addressed Patient grounding bracelet on and cord  attached to chamber Specifics for Inpatients (complete in addition to above) Medication sheet sent with patient NA Intravenous medications needed or due during therapy sent with patient NA Drainage tubes (e.g. nasogastric tube or chest tube secured and vented) NA Endotracheal or Tracheotomy tube secured NA Cuff deflated of  air and inflated with saline NA Airway suctioned NA Electronic Signature(s) Signed: 10/05/2020 6:30:22 PM By: Nancy Hurst RN, BSN Entered By: Nancy [name] on 10/03/2020 08:20:37

## 2020-10-05 NOTE — Progress Notes (Signed)
LASHYA, PASSE ([id number]) Visit Report for 10/04/2020 HBO Details Patient Name: Date of Service: Nancy [name], [name] 10/04/2020 8:00 A M Medical Record Number: [id number] Patient Account Number: [id number] Date of Birth/Sex: Treating RN: 12/17/1960 (60 y.o. 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 HBO Treatment Details Treatment Number: 36 Patient Type: Outpatient Chamber Type: Monoplace Chamber Serial #: [id number] Treatment Protocol: 2.5 ATA with 90 minutes oxygen, with two 5 minute air breaks Treatment Details Compression Rate Down: 2.0 psi / minute De-Compression Rate Up: 2.0 psi / minute A breaks and breathing ir Compress Tx Pressure periods Decompress Decompress Begins Reached (leave unused spaces Begins Ends blank) Chamber Pressure (ATA 1 2.5 2.5 2.5 2.5 2.5 - - 2.5 1 ) Clock Time (24 hr) 08:03 08:15 08:45 08:50 09:20 09:25 - - 09:55 10:07 Treatment Length: 124 (minutes) Treatment Segments: 4 Vital Signs Capillary Blood Glucose Reference Range: 80 - 120 mg / dl HBO Diabetic Blood Glucose Intervention Range: <131 mg/dl or >249 mg/dl Time Vitals Blood Respiratory Capillary Blood Glucose Pulse Action Type: Pulse: Temperature: Taken: Pressure: Rate: Glucose (mg/dl): Meter #: Oximetry (%) Taken: Pre 07:49 140/80 67 18 97.8 Post 10:07 141/98 60 18 98 Treatment Response Treatment Completion Status: Treatment Completed without Adverse Event Giomar Gusler Notes No concerns with treatment given Physician HBO Attestation: I certify that I supervised this HBO treatment in accordance with Medicare guidelines. A trained emergency  response team is readily available per Yes hospital policies and procedures. Continue HBOT as ordered. Yes Electronic Signature(s) Signed: 10/04/2020 4:16:43 PM By: Linton Ham MD Entered By: Linton Ham on 10/04/2020 16:13:40 -------------------------------------------------------------------------------- HBO Safety Checklist Details Patient Name: Date of Service: Nancy [name] NA D. 10/04/2020 8:00 A M Medical Record Number: [id number] Patient Account Number: [id number] Date of Birth/Sex: Treating RN: [birth year] (60 y.o. Nancy [name] Primary Care Sophonie Goforth: [name] Other Clinician: Referring Jameil Whitmoyer: Treating Dinorah Masullo/Extender: Rosana Hoes, [name] in Treatment: 7 HBO Safety Checklist Items Safety Checklist Consent Form Signed Patient voided / foley secured and emptied When did you last eato 0700 Last dose of injectable or oral agent na Ostomy pouch emptied and vented if applicable NA All implantable devices assessed, documented and approved NA Intravenous access site secured and place NA Valuables secured Linens and cotton and cotton/polyester blend (less than 51% polyester) Personal oil-based products / skin lotions / body lotions removed Wigs or hairpieces removed Smoking or tobacco materials removed Books / newspapers / magazines / loose paper removed Cologne, aftershave, perfume and deodorant removed Jewelry removed (may wrap wedding band) Make-up removed Hair care products removed Battery operated devices (external) removed Heating patches and chemical warmers removed Titanium eyewear removed Nail polish cured greater than 10 hours Casting material cured greater than 10 hours NA Hearing aids removed NA Loose dentures or partials removed NA Prosthetics have been removed NA Patient demonstrates correct use of air break device (if applicable) Patient concerns have been addressed Patient grounding bracelet on and cord  attached to chamber Specifics for Inpatients (complete in addition to above) Medication sheet sent with patient NA Intravenous medications needed or due during therapy sent with patient NA Drainage tubes (e.g. nasogastric tube or chest tube secured and vented) NA Endotracheal or Tracheotomy tube secured NA Cuff deflated of  air and inflated with saline NA Airway suctioned NA Electronic Signature(s) Signed: 10/05/2020 6:30:22 PM By: Levan Hurst RN, BSN Entered By: Levan Hurst on 10/04/2020 09:54:05

## 2020-10-06 ENCOUNTER — Other Ambulatory Visit: Payer: Self-pay

## 2020-10-06 ENCOUNTER — Encounter (HOSPITAL_BASED_OUTPATIENT_CLINIC_OR_DEPARTMENT_OTHER): Payer: Medicare Other | Admitting: Internal Medicine

## 2020-10-06 DIAGNOSIS — M272 Inflammatory conditions of jaws: Secondary | ICD-10-CM | POA: Diagnosis not present

## 2020-10-06 NOTE — Progress Notes (Signed)
CASARA, PERRIER ([id number]) Visit Report for 10/05/2020 HBO Details Patient Name: Date of Service: [place], [name] 10/05/2020 8:00 A M Medical Record Number: [id number] Patient Account Number: [id number] Date of Birth/Sex: Treating RN: [birth year] (60 y.o. 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 HBO Treatment Details Treatment Number: 37 Patient Type: Outpatient Chamber Type: Monoplace Chamber Serial #: [id number] Treatment Protocol: 2.5 ATA with 90 minutes oxygen, with two 5 minute air breaks Treatment Details Compression Rate Down: 2.0 psi / minute De-Compression Rate Up: 2.0 psi / minute A breaks and breathing ir Compress Tx Pressure periods Decompress Decompress Begins Reached (leave unused spaces Begins Ends blank) Chamber Pressure (ATA 1 2.5 2.5 2.5 2.5 2.5 - - 2.5 1 ) Clock Time (24 hr) 08:07 08:19 08:49 08:54 09:24 09:29 - - 09:59 10:08 Treatment Length: 121 (minutes) Treatment Segments: 4 Vital Signs Capillary Blood Glucose Reference Range: 80 - 120 mg / dl HBO Diabetic Blood Glucose Intervention Range: <131 mg/dl or >249 mg/dl Time Vitals Blood Respiratory Capillary Blood Glucose Pulse Action Type: Pulse: Temperature: Taken: Pressure: Rate: Glucose (mg/dl): Meter #: Oximetry (%) Taken: Pre 07:55 132/89 77 16 98 Post 10:09 146/56 56 16 97.9 Treatment Response Treatment Toleration: Well Treatment Completion Status: Treatment Completed without Adverse Event Electronic Signature(s) Signed: 10/05/2020 1:22:22 PM By: Worthy Keeler PA-C Signed: 10/06/2020 6:00:49 PM By: [name] Entered By: [name] on  10/05/2020 10:42:05 -------------------------------------------------------------------------------- HBO Safety Checklist Details Patient Name: Date of Service: Nancy [name], [name] 10/05/2020 8:00 A M Medical Record Number: [id number] Patient Account Number: [id number] Date of Birth/Sex: Treating RN: June 27, 1960 (60 y.o. Nancy [name], [name] Primary Care Chrisann Melaragno: [name] Other Clinician: Referring Leshaun Biebel: Treating Yacqub Baston/Extender: Rosiland Oz in Treatment: 7 HBO Safety Checklist Items Safety Checklist Consent Form Signed Patient voided / foley secured and emptied When did you last eato last night Last dose of injectable or oral agent n/a Ostomy pouch emptied and vented if applicable NA All implantable devices assessed, documented and approved NA Intravenous access site secured and place NA Valuables secured Linens and cotton and cotton/polyester blend (less than 51% polyester) Personal oil-based products / skin lotions / body lotions removed NA Wigs or hairpieces removed NA Smoking or tobacco materials removed NA Books / newspapers / magazines / loose paper removed NA Cologne, aftershave, perfume and deodorant removed NA Jewelry removed (may wrap wedding band) Make-up removed NA Hair care products removed NA Battery operated devices (external) removed NA Heating patches and chemical warmers removed NA Titanium eyewear removed NA Nail polish cured greater than 10 hours NA Casting material cured greater than 10 hours NA Hearing aids removed NA Loose dentures or partials removed NA Prosthetics have been removed NA Patient demonstrates correct use of air break device (if applicable) Patient concerns have been addressed Patient grounding bracelet on and cord attached to chamber Specifics for Inpatients (complete in addition to above) Medication sheet sent with patient Intravenous medications needed or due during therapy sent with  patient Drainage tubes (e.g. nasogastric tube or chest tube secured and vented) Endotracheal or Tracheotomy tube secured Cuff deflated of air and inflated with saline Airway suctioned Electronic Signature(s) Signed: 10/06/2020 6:00:49 PM By: [name] Entered By: [name] on 10/05/2020 10:41:13

## 2020-10-06 NOTE — Progress Notes (Signed)
FABIAN, COCA ([id number]) Visit Report for 10/05/2020 SuperBill Details Patient Name: Date of Service: [place], [name] 10/05/2020 Medical Record Number: [id number] Patient Account Number: [id number] Date of Birth/Sex: Treating RN: [birth year] (60 y.o. 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

## 2020-10-06 NOTE — Progress Notes (Signed)
NANCEE, BROWNRIGG ([id number]) Visit Report for 10/05/2020 Arrival Information Details Patient Name: Date of Service: [place], [name] 10/05/2020 8:00 A M Medical Record Number: [id number] Patient Account Number: [id number] Date of Birth/Sex: Treating RN: 04-03-1961 (60 y.o. [name], [name] Primary Care Yared Susan: [name] Other Clinician: Referring Mattea Seger: Treating Kartel Wolbert/Extender: Rosiland Oz in Treatment: 7 Visit Information History Since Last Visit Added or deleted any medications: No Patient Arrived: Ambulatory Any new allergies or adverse reactions: No Arrival Time: 07:55 Had a fall or experienced change in No Accompanied By: self activities of daily living that may affect Transfer Assistance: None risk of falls: Patient Identification Verified: Yes Signs or symptoms of abuse/neglect since last visito No Secondary Verification Process Completed: Yes Hospitalized since last visit: No Patient Requires Transmission-Based Precautions: No Implantable device outside of the clinic excluding No Patient Has Alerts: No cellular tissue based products placed in the center since last visit: Pain Present Now: No Electronic Signature(s) Signed: 10/06/2020 6:00:49 PM By: [name] Entered By: [name] on 10/05/2020 10:40:36 -------------------------------------------------------------------------------- Encounter Discharge Information Details Patient Name: Date of Service: [name], [name] 10/05/2020 8:00 A M Medical Record Number: [id number] Patient Account Number: [id number] Date of Birth/Sex: Treating RN: [birth year] (60 y.o. [name] Primary Care Raeford Brandenburg: [name] Other Clinician: Referring Joaopedro Eschbach: Treating Inga Noller/Extender: Rosiland Oz in Treatment: 7 Encounter Discharge Information Items Discharge Condition: Stable Ambulatory Status: Ambulatory Discharge Destination: Home Transportation: Private Auto Accompanied  By: self Schedule Follow-up Appointment: Yes Clinical Summary of Care: Electronic Signature(s) Signed: 10/06/2020 6:00:49 PM By: [name] Entered By: [name] on 10/05/2020 10:42:35 -------------------------------------------------------------------------------- Vitals Details Patient Name: Date of Service: [name], [name] 10/05/2020 8:00 A M Medical Record Number: [id number] Patient Account Number: [id number] Date of Birth/Sex: Treating RN: 04-13-1961 (60 y.o. [name], [name] Primary Care Kirill Chatterjee: [name] Other Clinician: Referring Calyse Murcia: Treating Joron Velis/Extender: Rosiland Oz in Treatment: 7 Vital Signs Time Taken: 07:55 Temperature (F): 98 Height (in): 65 Pulse (bpm): 77 Weight (lbs): 175 Respiratory Rate (breaths/min): 16 Body Mass Index (BMI): 29.1 Blood Pressure (mmHg): 132/89 Reference Range: 80 - 120 mg / dl Electronic Signature(s) Signed: 10/06/2020 6:00:49 PM By: [name] Entered By: [name] on 10/05/2020 10:40:46

## 2020-10-07 ENCOUNTER — Encounter (HOSPITAL_BASED_OUTPATIENT_CLINIC_OR_DEPARTMENT_OTHER): Payer: Medicare Other | Admitting: Internal Medicine

## 2020-10-07 ENCOUNTER — Other Ambulatory Visit: Payer: Self-pay

## 2020-10-07 DIAGNOSIS — M272 Inflammatory conditions of jaws: Secondary | ICD-10-CM | POA: Diagnosis not present

## 2020-10-10 NOTE — Progress Notes (Signed)
SHEKELIA, BOUTIN ([id number]) Visit Report for 10/06/2020 SuperBill Details Patient Name: Date of Service: Nancy [name], [name] 10/06/2020 Medical Record Number: [id number] Patient Account Number: [id number] Date of Birth/Sex: Treating RN: 1961-02-03 (60 y.o. 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

## 2020-10-10 NOTE — Progress Notes (Signed)
Nancy [name], Nancy [name] ([id number]) Visit Report for 10/06/2020 Arrival Information Details Patient Name: Date of Service: [place], [name] 10/06/2020 8:00 A M Medical Record Number: [id number] Patient Account Number: [id number] Date of Birth/Sex: Treating RN: January 01, 1961 (60 y.o. Nancy [name] Primary Care Nancy [name]: Nancy [name] Other Clinician: Referring Nancy [name]: Treating Nancy [name]/Extender: Nancy [name], Nancy [name] in Treatment: 8 Visit Information History Since Last Visit Added or deleted any medications: No Patient Arrived: Ambulatory Any new allergies or adverse reactions: No Arrival Time: 07:50 Had a fall or experienced change in No Accompanied By: alone activities of daily living that may affect Transfer Assistance: None risk of falls: Patient Identification Verified: Yes Signs or symptoms of abuse/neglect since last visito No Secondary Verification Process Completed: Yes Hospitalized since last visit: No Patient Requires Transmission-Based Precautions: No Implantable device outside of the clinic excluding No Patient Has Alerts: No cellular tissue based products placed in the center since last visit: Pain Present Now: No Electronic Signature(s) Signed: 10/10/2020 5:27:53 PM By: Nancy Hurst RN, BSN Entered By: Nancy [name] on 10/06/2020 08:31:29 -------------------------------------------------------------------------------- Encounter Discharge Information Details Patient Name: Date of Service: Nancy [name] NA D. 10/06/2020 8:00 A M Medical Record Number: [id number] Patient Account Number: [id number] Date of Birth/Sex: Treating RN: May 13, 1961 (60 y.o. Nancy [name] Primary Care Nancy [name]: Nancy [name] Other Clinician: Referring Zorah Backes: Treating Radley Barto/Extender: Nancy [name] in Treatment: 8 Encounter Discharge Information Items Discharge Condition: Stable Ambulatory Status: Ambulatory Discharge Destination:  Home Transportation: Private Auto Accompanied By: alone Schedule Follow-up Appointment: Yes Clinical Summary of Care: Patient Declined Electronic Signature(s) Signed: 10/10/2020 5:27:53 PM By: Nancy Hurst RN, BSN Entered By: Nancy [name] on 10/06/2020 10:44:27 -------------------------------------------------------------------------------- Vitals Details Patient Name: Date of Service: Nancy [name], [name] 10/06/2020 8:00 A M Medical Record Number: [id number] Patient Account Number: [id number] Date of Birth/Sex: Treating RN: 11-01-1960 (60 y.o. Nancy [name] Primary Care Nancy [name]: Nancy [name] Other Clinician: Referring Beckham Buxbaum: Treating Nancy [name]/Extender: Nancy [name], Nancy [name] in Treatment: 8 Vital Signs Time Taken: 07:50 Temperature (F): 98.1 Height (in): 65 Pulse (bpm): 79 Weight (lbs): 175 Respiratory Rate (breaths/min): 16 Body Mass Index (BMI): 29.1 Blood Pressure (mmHg): 152/79 Reference Range: 80 - 120 mg / dl Electronic Signature(s) Signed: 10/10/2020 5:27:53 PM By: Nancy Hurst RN, BSN Entered By: Nancy [name] on 10/06/2020 08:31:43

## 2020-10-10 NOTE — Progress Notes (Signed)
Nancy [name], Nancy [name] ([id number]) Visit Report for 10/07/2020 SuperBill Details Patient Name: Date of Service: Nancy [name], Nancy [name] [name] 10/07/2020 Medical Record Number: [id number] Patient Account Number: [id number] Date of Birth/Sex: Treating RN: 1960/05/27 (60 y.o. 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 Encounter for antineoplastic radiation therapy L59.8 Other specified disorders of the skin and subcutaneous tissue related to radiation Facility Procedures CPT4 Code Description Modifier Quantity [id number] G0277-(Facility Use Only) HBOT full body chamber, 50min , 4 Physician Procedures Quantity CPT4 Code Description Modifier [id number] 40347 - WC PHYS HYPERBARIC OXYGEN THERAPY 1 ICD-10 Diagnosis Description 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

## 2020-10-10 NOTE — Progress Notes (Signed)
NIMRIT, KEHRES ([id number]) Visit Report for 10/06/2020 HBO Details Patient Name: Date of Service: [place], [name] 10/06/2020 8:00 A M Medical Record Number: [id number] Patient Account Number: [id number] Date of Birth/Sex: Treating RN: 22-Oct-1960 (60 y.o. 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 HBO Treatment Details Treatment Number: 38 Patient Type: Outpatient Chamber Type: Monoplace Chamber Serial #: [id number] Treatment Protocol: 2.5 ATA with 90 minutes oxygen, with two 5 minute air breaks Treatment Details Compression Rate Down: 2.0 psi / minute De-Compression Rate Up: 2.0 psi / minute A breaks and breathing ir Compress Tx Pressure periods Decompress Decompress Begins Reached (leave unused spaces Begins Ends blank) Chamber Pressure (ATA 1 2.5 2.5 2.5 2.5 2.5 - - 2.5 1 ) Clock Time (24 hr) 08:04 08:16 08:46 08:51 09:21 09:26 - - 09:56 10:08 Treatment Length: 124 (minutes) Treatment Segments: 4 Vital Signs Capillary Blood Glucose Reference Range: 80 - 120 mg / dl HBO Diabetic Blood Glucose Intervention Range: <131 mg/dl or >249 mg/dl Time Vitals Blood Respiratory Capillary Blood Glucose Pulse Action Type: Pulse: Temperature: Taken: Pressure: Rate: Glucose (mg/dl): Meter #: Oximetry (%) Taken: Pre 07:50 152/79 79 16 98.1 Post 10:08 124/82 60 18 98 Treatment Response Treatment Completion Status: Treatment Completed without Adverse Event Miyu Fenderson Notes No concerns with treatment given Physician HBO Attestation: I certify that I supervised this HBO treatment in accordance with Medicare guidelines. A trained emergency  response team is readily available per Yes hospital policies and procedures. Continue HBOT as ordered. Yes Electronic Signature(s) Signed: 10/06/2020 4:52:14 PM By: Linton Ham MD Entered By: Linton Ham on 10/06/2020 16:42:04 -------------------------------------------------------------------------------- HBO Safety Checklist Details Patient Name: Date of Service: [name] 10/06/2020 8:00 A M Medical Record Number: [id number] Patient Account Number: [id number] Date of Birth/Sex: Treating RN: July 13, 1960 (60 y.o. Nancy Fetter Primary Care Mally Gavina: [name] Other Clinician: Referring Ella Guillotte: Treating Shellby Schlink/Extender: Rosana Hoes, [name] in Treatment: 8 HBO Safety Checklist Items Safety Checklist Consent Form Signed Patient voided / foley secured and emptied When did you last eato 0700 Last dose of injectable or oral agent na Ostomy pouch emptied and vented if applicable NA All implantable devices assessed, documented and approved NA Intravenous access site secured and place NA Valuables secured Linens and cotton and cotton/polyester blend (less than 51% polyester) Personal oil-based products / skin lotions / body lotions removed Wigs or hairpieces removed Smoking or tobacco materials removed Books / newspapers / magazines / loose paper removed Cologne, aftershave, perfume and deodorant removed Jewelry removed (may wrap wedding band) Make-up removed Hair care products removed Battery operated devices (external) removed Heating patches and chemical warmers removed Titanium eyewear removed Nail polish cured greater than 10 hours Casting material cured greater than 10 hours NA Hearing aids removed NA Loose dentures or partials removed NA Prosthetics have been removed NA Patient demonstrates correct use of air break device (if applicable) Patient concerns have been addressed Patient grounding bracelet on and cord  attached to chamber Specifics for Inpatients (complete in addition to above) Medication sheet sent with patient NA Intravenous medications needed or due during therapy sent with patient NA Drainage tubes (e.g. nasogastric tube or chest tube secured and vented) NA Endotracheal or Tracheotomy tube secured NA Cuff deflated of  air and inflated with saline NA Airway suctioned NA Electronic Signature(s) Signed: 10/10/2020 5:27:53 PM By: Levan Hurst RN, BSN Entered By: Levan Hurst on 10/06/2020 08:32:23

## 2020-10-10 NOTE — Progress Notes (Addendum)
Nancy [name], Nancy [name] ([id number]) Visit Report for 10/07/2020 HBO Details Patient Name: Date of Service: [place], [name] 10/07/2020 8:00 A M Medical Record Number: [id number] Patient Account Number: [id number] Date of Birth/Sex: Treating RN: Jul 24, 1960 (60 y.o. Nancy [name] Primary Care Sevilla Murtagh: [name] Other Clinician: Referring [name] [name]: Treating Daryan Cagley/Extender: Cleophas Dunker in Treatment: 8 HBO Treatment Course Details Treatment Course Number: 1 Ordering Yann Biehn: Linton Ham T Treatments Ordered: otal 40 HBO Treatment Start 08/15/2020 Date: HBO Indication: Osteoradionecrosis of tongue and jaw HBO Treatment End 10/07/2020 Date: HBO Discharge Treatment Series Complete; STRN/ORN Protocol Outcome: Goals Achieved HBO Treatment Details Treatment Number: 39 Patient Type: Outpatient Chamber Type: Monoplace Chamber Serial #: [id number] Treatment Protocol: 2.5 ATA with 90 minutes oxygen, with two 5 minute air breaks Treatment Details Compression Rate Down: 2.0 psi / minute De-Compression Rate Up: 2.0 psi / minute A breaks and breathing ir Compress Tx Pressure periods Decompress Decompress Begins Reached (leave unused spaces Begins Ends blank) Chamber Pressure (ATA 1 2.5 2.5 2.5 2.5 2.5 - - 2.5 1 ) Clock Time (24 hr) 08:00 08:12 08:42 08:47 09:17 09:22 - - 09:52 10:04 Treatment Length: 124 (minutes) Treatment Segments: 4 Vital Signs Capillary Blood Glucose Reference Range: 80 - 120 mg / dl HBO Diabetic Blood Glucose Intervention Range: <131 mg/dl or >249 mg/dl Time Vitals Blood Respiratory Capillary Blood Glucose Pulse Action Type: Pulse: Temperature: Taken: Pressure: Rate: Glucose (mg/dl): Meter #: Oximetry (%) Taken: Pre 07:47 134/93 110 18 97.6 Post 10:04 144/79 60 16 98.1 Treatment Response Treatment Toleration: Well Treatment Completion Status: Treatment Completed without Adverse Event Renell Coaxum Notes This was the patient's  last treatment. She has not noticed a change in the wound in the mandible however hopefully the underlying bone has benefited from hyperbaric treatment. She has an appoint with Dr. Lanell Persons next week of radiation oncology and is having a CT scan. She sees ENT again on June 25 and wonders whether she will require more surgery at that point. I told her to call us if we could be of further service surrounding the actual surgery Physician HBO Attestation: I certify that I supervised this HBO treatment in accordance with Medicare guidelines. A trained emergency response team is readily available per Yes hospital policies and procedures. Continue HBOT as ordered. Yes Electronic Signature(s) Signed: 11/10/2020 10:19:14 AM By: [name] Signed: 11/23/2020 5:14:27 PM By: Linton Ham MD Previous Signature: 10/07/2020 4:33:12 PM Version By: Linton Ham MD Entered By: [name] on 11/10/2020 10:19:13 -------------------------------------------------------------------------------- HBO Safety Checklist Details Patient Name: Date of Service: Nancy [name] NA D. 10/07/2020 8:00 A M Medical Record Number: [id number] Patient Account Number: [id number] Date of Birth/Sex: Treating RN: [birth year] (60 y.o. Nancy [name] Primary Care Janeisha Ryle: [name] Other Clinician: Referring Courtnee Myer: Treating Rosi Secrist/Extender: Rosana Hoes, [name] in Treatment: 8 HBO Safety Checklist Items Safety Checklist Consent Form Signed Patient voided / foley secured and emptied When did you last eato 0700 Last dose of injectable or oral agent na Ostomy pouch emptied and vented if applicable NA All implantable devices assessed, documented and approved NA Intravenous access site secured and place NA Valuables secured Linens and cotton and cotton/polyester blend (less than 51% polyester) Personal oil-based products / skin lotions / body lotions removed Wigs or hairpieces  removed Smoking or tobacco materials removed Books / newspapers / magazines / loose paper removed Cologne, aftershave, perfume and deodorant removed Jewelry removed (may wrap wedding band) Make-up removed Hair care products removed Battery operated devices (  external) removed Heating patches and chemical warmers removed Titanium eyewear removed Nail polish cured greater than 10 hours Casting material cured greater than 10 hours NA Hearing aids removed NA Loose dentures or partials removed NA Prosthetics have been removed NA Patient demonstrates correct use of air break device (if applicable) Patient concerns have been addressed Patient grounding bracelet on and cord attached to chamber Specifics for Inpatients (complete in addition to above) Medication sheet sent with patient NA Intravenous medications needed or due during therapy sent with patient NA Drainage tubes (e.g. nasogastric tube or chest tube secured and vented) NA Endotracheal or Tracheotomy tube secured NA Cuff deflated of air and inflated with saline NA Airway suctioned NA Electronic Signature(s) Signed: 10/10/2020 5:27:53 PM By: Levan Hurst RN, BSN Entered By: Levan Hurst on 10/07/2020 08:39:08

## 2020-10-10 NOTE — Progress Notes (Addendum)
MARZELL, ALLEMAND ([id number]) Visit Report for 10/07/2020 Arrival Information Details Patient Name: Date of Service: [place], [name] 10/07/2020 8:00 A M Medical Record Number: [id number] Patient Account Number: [id number] Date of Birth/Sex: Treating RN: [birth year] (60 y.o. Nancy Fetter Primary Care Derika Eckles: [name] Other Clinician: Referring Jaylanni Eltringham: Treating Cathyrn Deas/Extender: Rosana Hoes, [name] in Treatment: 8 Visit Information History Since Last Visit Added or deleted any medications: No Patient Arrived: Ambulatory Any new allergies or adverse reactions: No Arrival Time: 07:47 Had a fall or experienced change in No Accompanied By: alone activities of daily living that may affect Transfer Assistance: None risk of falls: Patient Identification Verified: Yes Signs or symptoms of abuse/neglect since last visito No Secondary Verification Process Completed: Yes Hospitalized since last visit: No Patient Requires Transmission-Based Precautions: No Implantable device outside of the clinic excluding No Patient Has Alerts: No cellular tissue based products placed in the center since last visit: Pain Present Now: No Electronic Signature(s) Signed: 10/10/2020 5:27:53 PM By: Levan Hurst RN, BSN Entered By: Levan Hurst on 10/07/2020 08:38:04 -------------------------------------------------------------------------------- Encounter Discharge Information Details Patient Name: Date of Service: [name] 10/07/2020 8:00 A M Medical Record Number: [id number] Patient Account Number: [id number] Date of Birth/Sex: Treating RN: Sep 22, 1960 (60 y.o. Nancy Fetter Primary Care Kruze Atchley: [name] Other Clinician: Referring Abeera Flannery: Treating Nielle Duford/Extender: Cleophas Dunker in Treatment: 8 Encounter Discharge Information Items Discharge Condition: Stable Ambulatory Status: Ambulatory Discharge Destination:  Home Transportation: Private Auto Accompanied By: alone Schedule Follow-up Appointment: Yes Clinical Summary of Care: Patient Declined Electronic Signature(s) Signed: 10/10/2020 5:27:53 PM By: Levan Hurst RN, BSN Entered By: Levan Hurst on 10/07/2020 10:37:16 -------------------------------------------------------------------------------- [place] Details Patient Name: Date of Service: [name] 10/07/2020 8:00 A M Medical Record Number: [id number] Patient Account Number: [id number] Date of Birth/Sex: Treating RN: 1961/01/11 (60 y.o. [name] Primary Care Nuno Brubacher: [name] Other Clinician: Referring Broady Lafoy: Treating Lariya Kinzie/Extender: Rosana Hoes, [name] in Treatment: 8 Active Inactive Electronic Signature(s) Signed: 11/10/2020 10:19:47 AM By: [name] Entered By: [name] on 11/10/2020 10:19:47 -------------------------------------------------------------------------------- Vitals Details Patient Name: Date of Service: [name], [name] 10/07/2020 8:00 A M Medical Record Number: [id number] Patient Account Number: [id number] Date of Birth/Sex: Treating RN: 1960-06-12 (60 y.o. Nancy Fetter Primary Care Loda Bialas: [name] Other Clinician: Referring Maureena Dabbs: Treating Prisila Dlouhy/Extender: Rosana Hoes, [name] in Treatment: 8 Vital Signs Time Taken: 07:47 Temperature (F): 97.6 Height (in): 65 Pulse (bpm): 110 Weight (lbs): 175 Respiratory Rate (breaths/min): 18 Body Mass Index (BMI): 29.1 Blood Pressure (mmHg): 134/93 Reference Range: 80 - 120 mg / dl Electronic Signature(s) Signed: 10/10/2020 5:27:53 PM By: Levan Hurst RN, BSN Entered By: Levan Hurst on 10/07/2020 08:38:29

## 2020-10-13 NOTE — Progress Notes (Signed)
Nancy [name] presents for follow up of radiation completed 06/18/2017 to her head/neck and tongue.  Pain issues, if any: Denies any throat or oral pain, but does have right shoulder pain/discomfort Using a feeding tube?: N/A Weight changes, if any:  Wt Readings from Last 3 Encounters:  10/14/20 182 lb 12.8 oz (82.9 kg)  04/22/20 179 lb 9.6 oz (81.5 kg)  10/16/19 181 lb 12.8 oz (82.5 kg)   Swallowing issues, if any: Patient denies any issues. Reports she can eat a wide variety or food and drink Smoking or chewing tobacco? None Using fluoride trays daily? Unable to use currently due to gum issues (received HBO to treat). Sees dentist Dr. Vernard Gambles regularly Last ENT visit was on: 06/21/2020 Saw Dr. 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 A biopsy was obtained to rule out malignancy as possible. We reviewed the diagnosis of ORN and dicussed treatment options including HBO therapy and antibiotic therapy. If today's biopsy shows no evidence of malignancy, we will proceed with PO antibiotics and HBO therapy. The patient is in agreement with this plan.  --Disease Status: No Evidence of Disease (NED)  --Plan:  1. FOLLOW-UP with me in 6 months, RTC sooner as needed 2. Biopsy obtained today. I will contact the patient with the results.  3. Ordered CT Neck today to evaluate the health of the mandible.  4. Plan for referral to HBO therapy and prescription of antibiotics pending review of today's biopsy. 5. Follow up with rad/onc as scheduled. 6. TOBACCO: Does not use Tobacco  Pathology 06/21/2020 Diagnosis   A: Gingiva, right, biopsy - Reactive gingival squamous epithelium with focal acute inflammation and dense submucosal chronic inflammation with focal entrapped bone fragment and few associated bacterial organisms -  No malignancy identified  Other notable issues, if any: Finished 8 weeks hyperbaric oxygen therapy. Reports she experienced most of the side effects mentioned. Denies any issues with dry mouth or thick saliva. Denies any symptoms of swelling to chin or down neck. Denies any ear or jaw pain or difficulty opening her mouth.   Vitals:   10/14/20 1004  BP: (!) 158/91  Pulse: 80  Resp: 18  Temp: 97.8 F (36.6 C)  SpO2: 98%

## 2020-10-14 ENCOUNTER — Other Ambulatory Visit (HOSPITAL_COMMUNITY): Payer: Self-pay

## 2020-10-14 ENCOUNTER — Ambulatory Visit
Admission: RE | Admit: 2020-10-14 | Discharge: 2020-10-14 | Disposition: A | Discharge status: Home / Self Care | Payer: Medicare Other | Source: Ambulatory Visit | Attending: Radiation Oncology | Admitting: Radiation Oncology

## 2020-10-14 ENCOUNTER — Other Ambulatory Visit: Payer: Self-pay

## 2020-10-14 ENCOUNTER — Encounter: Payer: Self-pay | Admitting: Radiation Oncology

## 2020-10-14 VITALS — BP 158/91 | HR 80 | Temp 97.8°F | Resp 18 | Ht 66.0 in | Wt 182.8 lb

## 2020-10-14 DIAGNOSIS — C021 Malignant neoplasm of border of tongue: Secondary | ICD-10-CM

## 2020-10-14 DIAGNOSIS — Z79899 Other long term (current) drug therapy: Secondary | ICD-10-CM | POA: Diagnosis not present

## 2020-10-14 DIAGNOSIS — Z85810 Personal history of malignant neoplasm of tongue: Secondary | ICD-10-CM | POA: Diagnosis present

## 2020-10-14 DIAGNOSIS — Z923 Personal history of irradiation: Secondary | ICD-10-CM | POA: Insufficient documentation

## 2020-10-14 MED ORDER — VITAMIN E 180 MG (400 UNIT) PO CAPS
ORAL_CAPSULE | ORAL | 5 refills | Status: AC
  Filled 2020-10-14: qty 60, fill #0

## 2020-10-14 MED ORDER — PENTOXIFYLLINE ER 400 MG PO TBCR
EXTENDED_RELEASE_TABLET | ORAL | 5 refills | Status: AC
  Filled 2020-10-14: qty 60, 33d supply, fill #0

## 2020-10-14 NOTE — Progress Notes (Addendum)
Radiation Oncology         [phone] ________________________________  Name: Nancy [name] MRN: [id number]  Date: 10/14/2020  DOB: 08/22/1960  Follow-Up Visit Note  CC: [name], MD  Colin Benton, MD  Diagnosis and Prior Radiotherapy:       ICD-10-CM   1. 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 (Comprehensive nodal radiation was not conducted)  CHIEF COMPLAINT:  Here for follow-up and surveillance of head and neck cancer  Narrative:       Nancy [name] presents for follow up of radiation completed 06/18/2017 to her head/neck and tongue.  Pain issues, if any: Denies any throat or oral pain, but does have right shoulder pain/discomfort Using a feeding tube?: N/A Weight changes, if any:  Wt Readings from Last 3 Encounters:  10/14/20 182 lb 12.8 oz (82.9 kg)  04/22/20 179 lb 9.6 oz (81.5 kg)  10/16/19 181 lb 12.8 oz (82.5 kg)   Swallowing issues, if any: Patient denies any issues. Reports she can eat a wide variety or food and drink Smoking or chewing tobacco? None Using fluoride trays daily? Unable to use currently due to gum issues (received HBO to treat). Sees dentist Dr. Vernard Gambles regularly Last ENT visit was on: 06/21/2020 Saw Dr. 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 A biopsy was obtained to rule out malignancy as possible. We reviewed the diagnosis of ORN and dicussed treatment options including HBO therapy and antibiotic therapy.  If today's biopsy shows no evidence of malignancy, we will proceed with PO antibiotics and HBO therapy. The patient is in agreement with this plan.    (Biopsy was negative for malignancy.  She completed hyperbaric oxygen. CT is pending.  She follows up with him in June)  Other notable issues, if any: Finished 8 weeks hyperbaric oxygen therapy. Reports she experienced most of the side effects mentioned. Denies any issues with dry mouth or thick saliva. Denies any symptoms of swelling to chin or down neck. Denies any ear or jaw pain or difficulty opening her mouth.  She reports that the hyperbaric oxygen did not  heal her dental root exposure.  Vitals:   10/14/20 1004  BP: (!) 158/91  Pulse: 80  Resp: 18  Temp: 97.8 F (36.6 C)  SpO2: 98%       ALLERGIES:  is allergic to amoxicillin and iodinated diagnostic agents.  Meds: Current Outpatient Medications  Medication Sig Dispense Refill  . pentoxifylline (TRENTAL) 400 MG CR tablet Take 1 tablet daily with food for 1 week, then 1 tablet 2 times a day with food 60 tablet 5  . vitamin E 180 MG (400 UNITS) capsule Take 400 IU daily x 1 week, then 400 IU BID 60 capsule 5  . chlorthalidone (HYGROTON) 25 MG tablet Take 25 mg by mouth daily.     . Cholecalciferol 125 MCG (5000 UT) capsule Take 5,000 Units by mouth daily.    Marland Kitchen DEXILANT 60 MG capsule Take 60 mg by mouth daily.     Marland Kitchen  diclofenac Sodium (VOLTAREN) 1 % GEL Apply 4 g topically 4 (four) times daily. (Patient not taking: Reported on 10/16/2019) 100 g 2  . HYDROcodone-acetaminophen (NORCO/VICODIN) 5-325 MG tablet Take 1 tablet by mouth every 6 (six) hours as needed for severe pain. (Patient not taking: Reported on 04/22/2020) 10 tablet 0  . lamoTRIgine (LAMICTAL) 150 MG tablet Take 300 mg by mouth daily.    Marland Kitchen levothyroxine (SYNTHROID, LEVOTHROID) 25 MCG tablet Take 25 mcg by mouth daily before breakfast.     . lidocaine (LIDODERM) 5 % Place 1 patch onto the skin daily. Remove & Discard patch  within 12 hours or as directed by MD 30 patch 0  . lidocaine (XYLOCAINE) 2 % solution Patient: Mix 1part 2% viscous lidocaine, 1part H20. Swish & swallow 36mL of diluted mixture, 26min before meals and at bedtime, up to QID (Patient not taking: Reported on 10/25/2017) 300 mL 2  . losartan (COZAAR) 100 MG tablet Take 100 mg by mouth daily.     . methocarbamol (ROBAXIN) 500 MG tablet Take 1 tablet (500 mg total) by mouth 2 (two) times daily. (Patient not taking: Reported on 04/22/2020) 20 tablet 0  . ondansetron (ZOFRAN) 8 MG tablet Take 1 tablet (8 mg total) by mouth every 8 (eight) hours as needed for nausea or vomiting. (Patient not taking: Reported on 07/09/2017) 30 tablet 2  . PARoxetine (PAXIL) 40 MG tablet Take 40 mg by mouth daily.    . silver sulfADIAZINE (SILVADENE) 1 % cream Apply 1 application topically daily. Triamcinolone acetonide 0.1% and silver sulfadiazine 1% 1:1 ratio    . sodium fluoride (FLUORISHIELD) 1.1 % GEL dental gel Instill 1 drop of gel per tooth space of fluoride tray.  Place over teeth for 5 minutes.  Remove.  Spit out excess.  Repeat nightly. 120 mL prn   No current facility-administered medications for this encounter.    Physical Findings: The patient is in no acute distress. Patient is alert and oriented. Wt Readings from Last 3 Encounters:  10/14/20 182 lb 12.8 oz (82.9 kg)  04/22/20 179 lb 9.6 oz (81.5 kg)  10/16/19 181 lb 12.8 oz (82.5 kg)    height is 5\' 6"  (1.676 m) and weight is 182 lb 12.8 oz (82.9 kg). Her temperature is 97.8 F (36.6 C). Her blood pressure is 158/91 (abnormal) and her pulse is 80. Her respiration is 18 and oxygen saturation is 98%. .  General: Alert and oriented, in no acute distress.  HEENT: Head is normocephalic. Extraocular movements are intact.  No thrush, and no visible tumor in the oral cavity or oropharynx.   Small area of exposed bone in the anterior gingiva /mandible. Neck: No palpable masses and skin is intact Skin: Skin in  treatment fields shows satisfactory healing. Chest: CTAB Heart: RRR no murmurs Lymphatics: see Neck Exam Psychiatric: Judgment and insight are intact. Affect is appropriate.   Lab Findings: Lab Results  Component Value Date   WBC 5.3 03/05/2018   HGB 14.3 03/05/2018   HCT 41.1 03/05/2018   MCV 93.0 03/05/2018   PLT 209 03/05/2018    No results found for: TSH  Radiographic Findings: No results found.  Impression/Plan:    1) Head and Neck Cancer Status: NED  2) Nutritional Status: stable PEG tube: None  3) Risk Factors: The patient has been educated about risk factors including alcohol and tobacco abuse; they understand that avoidance of alcohol and tobacco is important to prevent recurrences as well as other cancers  4)  Swallowing: No issues at this time.  5) Dental (also see #7): Encouraged to continue regular followup with dentistry, and dental hygiene including fluoride rinses.   6) Thyroid function: No results found for: TSH Check annually w/ PCP.  Unlikely to be affected by RT which was unilateral and not comprehensive to whole neck  7) Follow-up in rad/onc in 48mo. The nonhealing area at the anterior mandibular gingiva, we will start her on a trial of pentoxifylline and vitamin E to aid w/ healing.  She can continue this for up to 6 months if well tolerated.  She will talk to otolaryngology in a month and continue it after that if they concur.  She does anticipate debridement in the future since hyperbaric oxygen was not successful.  On date of service, in total, I spent 35 minutes on this encounter.  She was seen in person.   _____________________________________   Eppie Gibson, MD

## 2020-10-19 ENCOUNTER — Other Ambulatory Visit: Payer: Medicare Other

## 2020-10-19 ENCOUNTER — Ambulatory Visit: Payer: Medicare Other | Admitting: Hematology and Oncology

## 2020-10-21 ENCOUNTER — Other Ambulatory Visit: Payer: Medicare Other

## 2020-10-21 ENCOUNTER — Ambulatory Visit: Payer: Medicare Other | Admitting: Hematology and Oncology

## 2021-10-13 ENCOUNTER — Ambulatory Visit: Payer: Self-pay | Admitting: Radiation Oncology
